# Patient Record
Sex: Female | Born: 2000 | Race: Black or African American | Hispanic: No | Marital: Single | Smoking: Never smoker
Health system: Southern US, Community
[De-identification: ages and names within clinical notes are randomized; demographics above are authoritative.]

## PROBLEM LIST (undated history)

## (undated) ENCOUNTER — Ambulatory Visit: Admission: EM | Disposition: A | Discharge status: Other Acute Inpatient Hospital | Payer: Medicaid Other

## (undated) DIAGNOSIS — E559 Vitamin D deficiency, unspecified: Secondary | ICD-10-CM

## (undated) DIAGNOSIS — K5190 Ulcerative colitis, unspecified, without complications: Secondary | ICD-10-CM

## (undated) HISTORY — DX: Ulcerative colitis, unspecified, without complications: K51.90

## (undated) HISTORY — PX: WISDOM TOOTH EXTRACTION: SHX21

## (undated) HISTORY — PX: TONSILLECTOMY AND ADENOIDECTOMY: SUR1326

## (undated) HISTORY — DX: Vitamin D deficiency, unspecified: E55.9

---
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
## 2019-09-23 DIAGNOSIS — N912 Amenorrhea, unspecified: Secondary | ICD-10-CM | POA: Diagnosis not present

## 2019-09-23 DIAGNOSIS — Z760 Encounter for issue of repeat prescription: Secondary | ICD-10-CM | POA: Diagnosis not present

## 2019-09-23 DIAGNOSIS — Z202 Contact with and (suspected) exposure to infections with a predominantly sexual mode of transmission: Secondary | ICD-10-CM | POA: Diagnosis not present

## 2019-10-04 ENCOUNTER — Other Ambulatory Visit: Payer: Self-pay

## 2019-10-04 ENCOUNTER — Encounter: Payer: Self-pay | Admitting: Emergency Medicine

## 2019-10-04 ENCOUNTER — Ambulatory Visit
Admission: EM | Admit: 2019-10-04 | Discharge: 2019-10-04 | Disposition: A | Discharge status: Home / Self Care | Payer: Medicaid Other

## 2019-10-04 DIAGNOSIS — T162XXA Foreign body in left ear, initial encounter: Secondary | ICD-10-CM | POA: Diagnosis not present

## 2019-10-04 NOTE — ED Notes (Signed)
Patient able to ambulate independently  

## 2019-10-04 NOTE — Discharge Instructions (Signed)
Return for worsening ear pain, swelling, discharge, bleeding, decreased hearing, development of jaw pain/swelling, fever.  Do NOT use Q-tips as these can cause your ear wax to get stuck, the tips may break off and become a foreign body requiring additional medical care, or puncture your eardrum.

## 2019-10-04 NOTE — ED Triage Notes (Addendum)
Pt presents to [facility] for assessment after some of the cotton from her qtip remained in her ear last night.  C/o dampened hearing, denies pain at this time.  This RN does not visualize FB in ear at this time.

## 2019-10-04 NOTE — ED Provider Notes (Signed)
[facility]    CSN: [id number] Arrival date & time: 10/04/19  1000      History   Chief Complaint Chief Complaint  Patient presents with  . Otalgia    HPI Lacey Perez is a 19 y.o. female history of asthma presenting for foreign body in left ear.  States the Q-tip broke off in her left ear when she was cleaning them.  Patient is able to soak area with water this morning without improvement.  Patient endorsing muffled hearing: Denying ear pain, fever, discharge or bleeding.   Past Medical History:  Diagnosis Date  . Asthma     There are no problems to display for this patient.   History reviewed. No pertinent surgical history.  OB History   No obstetric history on file.      Home Medications    Prior to Admission medications   Medication Sig Start Date End Date Taking? Authorizing Provider  valACYclovir (VALTREX) 500 MG tablet Take 500 mg by mouth 2 (two) times daily.   Yes [provider]  albuterol (PROVENTIL HFA;VENTOLIN HFA) 108 (90 BASE) MCG/ACT inhaler Inhale 2 puffs into the lungs every 6 (six) hours as needed. For breathing     [provider]  Fluticasone-Salmeterol (ADVAIR) 250-50 MCG/DOSE AEPB Inhale 1 puff into the lungs every 12 (twelve) hours.      [provider]  montelukast (SINGULAIR) 10 MG tablet Take 10 mg by mouth at bedtime.      [provider]    Family History Family History  Problem Relation Age of Onset  . Hypertension Mother     Social History Social History   Tobacco Use  . Smoking status: Never Smoker  . Smokeless tobacco: Never Used  Substance Use Topics  . Alcohol use: Not Currently  . Drug use: Never     Allergies   Patient has no known allergies.   Review of Systems As per HPI   Physical Exam Triage Vital Signs ED Triage Vitals  Enc Vitals Group     BP      Pulse      Resp      Temp      Temp src      SpO2      Weight      Height      Head  Circumference      Peak Flow      Pain Score      Pain Loc      Pain Edu?      Excl. in [place]?    No data found.  Updated Vital Signs BP 125/90 (BP Location: Left Arm)   Pulse 76   Temp 97.8 F (36.6 C) (Temporal)   Resp 16   LMP 10/01/2019   SpO2 98%   Visual Acuity Right Eye Distance:   Left Eye Distance:   Bilateral Distance:    Right Eye Near:   Left Eye Near:    Bilateral Near:     Physical Exam Constitutional:      General: She is not in acute distress. HENT:     Head: Normocephalic and atraumatic.     Right Ear: Tympanic membrane, ear canal and external ear normal.     Left Ear: External ear normal.     Ears:     Comments: Your canal with polyp this white material. S/p irrigation: TM intact, EAC without swelling, discharge, bleeding.  Foreign body successfully removed Eyes:  General: No scleral icterus.    Pupils: Pupils are equal, round, and reactive to light.  Cardiovascular:     Rate and Rhythm: Normal rate.  Pulmonary:     Effort: Pulmonary effort is normal.  Skin:    Coloration: Skin is not jaundiced or pale.  Neurological:     Mental Status: She is alert and oriented to person, place, and time.      UC Treatments / Results  Labs (all labs ordered are listed, but only abnormal results are displayed) Labs Reviewed - No data to display  EKG   Radiology No results found.  Procedures Procedures (including critical care time)  Medications Ordered in UC Medications - No data to display  Initial Impression / Assessment and Plan / UC Course  I have reviewed the triage vital signs and the nursing notes.  Pertinent labs & imaging results that were available during my care of the patient were reviewed by me and considered in my medical decision making (see chart for details).     Patient afebrile, nontoxic in office today.  Foreign body successfully removed with ear irrigation.  Reassuring exam signs on repeat examination.  No further  intervention needed.  Return precautions discussed, patient verbalized understanding and is agreeable to plan. Final Clinical Impressions(s) / UC Diagnoses   Final diagnoses:  Foreign body of left ear, initial encounter     Discharge Instructions     Return for worsening ear pain, swelling, discharge, bleeding, decreased hearing, development of jaw pain/swelling, fever.  Do NOT use Q-tips as these can cause your ear wax to get stuck, the tips may break off and become a foreign body requiring additional medical care, or puncture your eardrum.    ED Prescriptions    None     PDMP not reviewed this encounter.   Hall[place]Potvin, [place], [place] 10/04/19 1043

## 2019-10-07 ENCOUNTER — Other Ambulatory Visit: Payer: Self-pay

## 2019-10-07 ENCOUNTER — Emergency Department (HOSPITAL_COMMUNITY)
Admission: EM | Admit: 2019-10-07 | Discharge: 2019-10-07 | Disposition: A | Discharge status: Home / Self Care | Attending: Emergency Medicine | Admitting: Emergency Medicine

## 2019-10-07 DIAGNOSIS — Z79899 Other long term (current) drug therapy: Secondary | ICD-10-CM | POA: Diagnosis not present

## 2019-10-07 DIAGNOSIS — S134XXA Sprain of ligaments of cervical spine, initial encounter: Secondary | ICD-10-CM | POA: Diagnosis not present

## 2019-10-07 DIAGNOSIS — R519 Headache, unspecified: Secondary | ICD-10-CM | POA: Insufficient documentation

## 2019-10-07 DIAGNOSIS — Y9389 Activity, other specified: Secondary | ICD-10-CM | POA: Diagnosis not present

## 2019-10-07 DIAGNOSIS — Y92481 Parking lot as the place of occurrence of the external cause: Secondary | ICD-10-CM | POA: Insufficient documentation

## 2019-10-07 DIAGNOSIS — J45909 Unspecified asthma, uncomplicated: Secondary | ICD-10-CM | POA: Insufficient documentation

## 2019-10-07 DIAGNOSIS — S199XXA Unspecified injury of neck, initial encounter: Secondary | ICD-10-CM | POA: Diagnosis present

## 2019-10-07 DIAGNOSIS — Y999 Unspecified external cause status: Secondary | ICD-10-CM | POA: Insufficient documentation

## 2019-10-07 DIAGNOSIS — S161XXA Strain of muscle, fascia and tendon at neck level, initial encounter: Secondary | ICD-10-CM | POA: Insufficient documentation

## 2019-10-07 DIAGNOSIS — Z20828 Contact with and (suspected) exposure to other viral communicable diseases: Secondary | ICD-10-CM | POA: Diagnosis not present

## 2019-10-07 MED ORDER — NAPROXEN 500 MG PO TABS: 500.0000 mg | ORAL_TABLET | Freq: Two times a day (BID) | ORAL | 0 refills | Status: DC

## 2019-10-07 MED ORDER — CYCLOBENZAPRINE HCL 10 MG PO TABS: 10.0000 mg | ORAL_TABLET | Freq: Two times a day (BID) | ORAL | 0 refills | Status: DC | PRN

## 2019-10-07 NOTE — Discharge Instructions (Signed)
Thank you for allowing me to care for you today. Please return to the emergency department if you have new or worsening symptoms. Take your medications as instructed.  ° °
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
## 2019-10-07 NOTE — ED Notes (Signed)
ED Provider at bedside. 

## 2019-10-11 ENCOUNTER — Ambulatory Visit (INDEPENDENT_AMBULATORY_CARE_PROVIDER_SITE_OTHER)
Admission: RE | Admit: 2019-10-11 | Discharge: 2019-10-11 | Disposition: A | Discharge status: Home / Self Care | Source: Ambulatory Visit

## 2019-10-11 DIAGNOSIS — B9689 Other specified bacterial agents as the cause of diseases classified elsewhere: Secondary | ICD-10-CM | POA: Diagnosis not present

## 2019-10-11 DIAGNOSIS — J0110 Acute frontal sinusitis, unspecified: Secondary | ICD-10-CM | POA: Diagnosis not present

## 2019-10-11 MED ORDER — FLUTICASONE PROPIONATE 50 MCG/ACT NA SUSP: 1.0000 | Freq: Every day | NASAL | 2 refills | Status: DC

## 2019-10-11 MED ORDER — AMOXICILLIN-POT CLAVULANATE 875-125 MG PO TABS: 1.0000 | ORAL_TABLET | Freq: Two times a day (BID) | ORAL | 0 refills | Status: DC

## 2019-10-11 MED ORDER — CETIRIZINE-PSEUDOEPHEDRINE ER 5-120 MG PO TB12: 1.0000 | ORAL_TABLET | Freq: Every day | ORAL | 0 refills | Status: DC

## 2019-10-11 NOTE — Discharge Instructions (Signed)
Treating you for a sinus infection.  Take medication as prescribed.

## 2019-10-11 NOTE — ED Provider Notes (Signed)
Virtual Visit via Video Note:  Deniesha Stenglein  initiated request for Telemedicine visit with [facility] Urgent [facility] team. I connected with Denice Bors  on 10/11/2019 at 4:27 PM  for a synchronized telemedicine visit using a video enabled HIPPA compliant telemedicine application. I verified that I am speaking with Denice Bors  using two identifiers. [name], NP  was physically located in a [facility] Urgent care site and Shanaia Sievers was located at a different location.   The limitations of evaluation and management by telemedicine as well as the availability of in-person appointments were discussed. Patient was informed that she  may incur a bill ( including co-pay) for this virtual visit encounter. [name]  expressed understanding and gave verbal consent to proceed with virtual visit.     History of Present Illness:[name]  is a 19 y.o. female presents with nasal congestion, rhinorrhea and cough x 1 week or more. Thick mucous. Covid negative last Wednesday. Productive cough. Taking OTC cold medications with some relief. Sinus pressure, headaches. No fatigue. No fever. She has also been taking zyrtec. Feels like her symptoms are not improving at all.   Past Medical History:  Diagnosis Date  . Asthma     No Known Allergies      Observations/Objective: VITALS: Per patient if applicable, see vitals. GENERAL: Alert, appears well and in no acute distress. HEENT: Atraumatic, conjunctiva clear, no obvious abnormalities on inspection of external nose and ears. NECK: Normal movements of the head and neck. CARDIOPULMONARY: No increased WOB. Speaking in clear sentences. I:E ratio WNL.  MS: Moves all visible extremities without noticeable abnormality. PSYCH: Pleasant and cooperative, well-groomed. Speech normal rate and rhythm. Affect is appropriate. Insight and judgement are appropriate. Attention is focused, linear, and appropriate.  NEURO: CN grossly intact.  Oriented as arrived to appointment on time with no prompting. Moves both UE equally.  SKIN: No obvious lesions, wounds, erythema, or cyanosis noted on face or hands.     Assessment and Plan: Sinusitis-treating for bacterial sinus infection based on length of symptoms Treated with Augmentin. Flonase and Zyrtec   Follow Up Instructions:Follow up as needed for continued or worsening symptoms     I discussed the assessment and treatment plan with the patient. The patient was provided an opportunity to ask questions and all were answered. The patient agreed with the plan and demonstrated an understanding of the instructions.   The patient was advised to call back or seek an in-person evaluation if the symptoms worsen or if the condition fails to improve as anticipated.    [name], NP  10/11/2019 4:27 PM         [name], NP 10/11/19 1627

## 2019-12-25 ENCOUNTER — Ambulatory Visit
Admission: EM | Admit: 2019-12-25 | Discharge: 2019-12-25 | Disposition: A | Discharge status: Home / Self Care | Payer: Medicaid Other

## 2019-12-25 DIAGNOSIS — B373 Candidiasis of vulva and vagina: Secondary | ICD-10-CM

## 2019-12-25 DIAGNOSIS — R030 Elevated blood-pressure reading, without diagnosis of hypertension: Secondary | ICD-10-CM | POA: Diagnosis not present

## 2019-12-25 DIAGNOSIS — B3731 Acute candidiasis of vulva and vagina: Secondary | ICD-10-CM

## 2019-12-25 MED ORDER — FLUCONAZOLE 150 MG PO TABS: 150.0000 mg | ORAL_TABLET | Freq: Every day | ORAL | 0 refills | Status: DC

## 2019-12-25 NOTE — Discharge Instructions (Addendum)
Take Diflucan once a day, may repeat dose in 3 days if needed. Important to follow-up with your PCP regarding blood pressure readings.

## 2019-12-25 NOTE — ED Triage Notes (Signed)
Pt c/o white clumpy vaginal discharge x1wk. Denies odor or having un protective intercourse.

## 2019-12-25 NOTE — ED Provider Notes (Signed)
[facility]    CSN: [id number] Arrival date & time: 12/25/19  1055      History   Chief Complaint Chief Complaint  Patient presents with  . Vaginal Discharge    HPI Lacey Perez is a 19 y.o. female with history of asthma presenting for yeast infection.  Patient states she has had this before: Feels the same.  Endorsing vaginal pruritus, thick, white, clumpy vaginal discharge x1 week.  Denies irregular bleeding, vaginal pelvic pain, abdominal or back pain, fever.  Patient is not currently sexually active: No concern for STI or pregnancy.  Has tried Monistat without relief.   Past Medical History:  Diagnosis Date  . Asthma     There are no problems to display for this patient.   History reviewed. No pertinent surgical history.  OB History   No obstetric history on file.      Home Medications    Prior to Admission medications   Medication Sig Start Date End Date Taking? Authorizing Provider  valACYclovir (VALTREX) 500 MG tablet Take 500 mg by mouth daily.   Yes [provider]  fluconazole (DIFLUCAN) 150 MG tablet Take 1 tablet (150 mg total) by mouth daily. May repeat in 72 hours if needed 12/25/19   Hall-Potvin, [place], PA-C  albuterol (PROVENTIL HFA;VENTOLIN HFA) 108 (90 BASE) MCG/ACT inhaler Inhale 2 puffs into the lungs every 6 (six) hours as needed. For breathing   10/11/19  [provider]  Fluticasone-Salmeterol (ADVAIR) 250-50 MCG/DOSE AEPB Inhale 1 puff into the lungs every 12 (twelve) hours.    10/11/19  [provider]  montelukast (SINGULAIR) 10 MG tablet Take 10 mg by mouth at bedtime.    10/11/19  [provider]    Family History Family History  Problem Relation Age of Onset  . Hypertension Mother     Social History Social History   Tobacco Use  . Smoking status: Never Smoker  . Smokeless tobacco: Never Used  Substance Use Topics  . Alcohol use: Not Currently  . Drug use: Never      Allergies   Patient has no known allergies.   Review of Systems As per HPI   Physical Exam Triage Vital Signs ED Triage Vitals  Enc Vitals Group     BP      Pulse      Resp      Temp      Temp src      SpO2      Weight      Height      Head Circumference      Peak Flow      Pain Score      Pain Loc      Pain Edu?      Excl. in GC?    No data found.  Updated Vital Signs BP (!) 140/94 (BP Location: Left Arm)   Pulse 91   Temp 98.1 F (36.7 C) (Oral)   Resp 18   LMP 11/29/2019   SpO2 98%   Visual Acuity Right Eye Distance:   Left Eye Distance:   Bilateral Distance:    Right Eye Near:   Left Eye Near:    Bilateral Near:     Physical Exam Constitutional:      General: She is not in acute distress. HENT:     Head: Normocephalic and atraumatic.  Eyes:     General: No scleral icterus.    Pupils: Pupils are equal, round, and reactive to  light.  Cardiovascular:     Rate and Rhythm: Normal rate.  Pulmonary:     Effort: Pulmonary effort is normal.  Abdominal:     General: Bowel sounds are normal.     Palpations: Abdomen is soft.     Tenderness: There is no abdominal tenderness. There is no right CVA tenderness, left CVA tenderness or guarding.  Genitourinary:    Comments: Patient declined Skin:    Coloration: Skin is not jaundiced or pale.  Neurological:     Mental Status: She is alert and oriented to person, place, and time.      UC Treatments / Results  Labs (all labs ordered are listed, but only abnormal results are displayed) Labs Reviewed - No data to display  EKG   Radiology No results found.  Procedures Procedures (including critical care time)  Medications Ordered in UC Medications - No data to display  Initial Impression / Assessment and Plan / UC Course  I have reviewed the triage vital signs and the nursing notes.  Pertinent labs & imaging results that were available during my care of the patient were reviewed by me  and considered in my medical decision making (see chart for details).     H&P consistent with yeast vaginitis: Diflucan sent.  Patient does have elevated blood pressure reading in office without diagnosis of hypertension.  Denying signs/symptoms of endorgan damage: We will follow-up with PCP for further evaluation/management.  Return precautions discussed, patient verbalized understanding and is agreeable to plan. Final Clinical Impressions(s) / UC Diagnoses   Final diagnoses:  Elevated blood pressure reading without diagnosis of hypertension  Vaginal yeast infection     Discharge Instructions     Take Diflucan once a day, may repeat dose in 3 days if needed. Important to follow-up with your PCP regarding blood pressure readings.    ED Prescriptions    Medication Sig Dispense Auth. Provider   fluconazole (DIFLUCAN) 150 MG tablet Take 1 tablet (150 mg total) by mouth daily. May repeat in 72 hours if needed 2 tablet Hall-Potvin, [place], PA-C     PDMP not reviewed this encounter.   Hall-Potvin, [place], [place] 12/25/19 1113

## 2020-05-14 DIAGNOSIS — Z20822 Contact with and (suspected) exposure to covid-19: Secondary | ICD-10-CM | POA: Diagnosis not present

## 2020-05-28 ENCOUNTER — Other Ambulatory Visit: Payer: Self-pay

## 2020-05-28 ENCOUNTER — Ambulatory Visit
Admission: EM | Admit: 2020-05-28 | Discharge: 2020-05-28 | Disposition: A | Discharge status: Home / Self Care | Payer: Medicaid Other | Attending: Family Medicine | Admitting: Family Medicine

## 2020-05-28 DIAGNOSIS — R519 Headache, unspecified: Secondary | ICD-10-CM

## 2020-05-28 DIAGNOSIS — J069 Acute upper respiratory infection, unspecified: Secondary | ICD-10-CM

## 2020-05-28 DIAGNOSIS — R52 Pain, unspecified: Secondary | ICD-10-CM

## 2020-05-28 MED ORDER — IPRATROPIUM BROMIDE 0.03 % NA SOLN: 2.0000 | Freq: Two times a day (BID) | NASAL | 0 refills | Status: DC | PRN

## 2020-05-28 MED ORDER — TIZANIDINE HCL 2 MG PO TABS: 4.0000 mg | ORAL_TABLET | Freq: Four times a day (QID) | ORAL | 0 refills | Status: DC | PRN

## 2020-05-28 MED ORDER — NAPROXEN 375 MG PO TABS: 375.0000 mg | ORAL_TABLET | Freq: Two times a day (BID) | ORAL | 0 refills | Status: DC

## 2020-05-28 NOTE — ED Triage Notes (Signed)
Pt states received her first covid vaccine on Monday and developed headaches and body aches right afterwards. Pt states has a slight cough and runny nose now.

## 2020-05-28 NOTE — ED Provider Notes (Signed)
[facility]    CSN: [id number] Arrival date & time: 05/28/20  0809      History   Chief Complaint Chief Complaint  Patient presents with  . Headache    HPI Lacey Perez is a 19 y.o. female.   HPI   Patient presents today with a concern of headache and body aches along with congestion and a mild cough since having her first Covid vaccine on 05/24/2020. She reports symptoms started the night following her vaccine.  She has not had a fever or shortness of breath.  She is afebrile on arrival.  She denies any known sick contacts.  She is unvaccinated against influenza.  Denies any shortness of breath although endorses a persistent headache which is generalized.  She has taken ibuprofen without relief of symptoms.  Past Medical History:  Diagnosis Date  . Asthma     There are no problems to display for this patient.   History reviewed. No pertinent surgical history.  OB History   No obstetric history on file.      Home Medications    Prior to Admission medications   Medication Sig Start Date End Date Taking? Authorizing Provider  ipratropium (ATROVENT) 0.03 % nasal spray Place 2 sprays into both nostrils 2 (two) times daily as needed for rhinitis. 05/28/20   Bing Neighbors, FNP  naproxen (NAPROSYN) 375 MG tablet Take 1 tablet (375 mg total) by mouth 2 (two) times daily. 05/28/20   Bing Neighbors, FNP  tiZANidine (ZANAFLEX) 2 MG tablet Take 2 tablets (4 mg total) by mouth every 6 (six) hours as needed for muscle spasms. 05/28/20   Bing Neighbors, FNP  albuterol (PROVENTIL HFA;VENTOLIN HFA) 108 (90 BASE) MCG/ACT inhaler Inhale 2 puffs into the lungs every 6 (six) hours as needed. For breathing   10/11/19  [provider]  Fluticasone-Salmeterol (ADVAIR) 250-50 MCG/DOSE AEPB Inhale 1 puff into the lungs every 12 (twelve) hours.    10/11/19  [provider]  montelukast (SINGULAIR) 10 MG tablet Take 10 mg by mouth at bedtime.    10/11/19   [provider]    Family History Family History  Problem Relation Age of Onset  . Hypertension Mother     Social History Social History   Tobacco Use  . Smoking status: Never Smoker  . Smokeless tobacco: Never Used  Substance Use Topics  . Alcohol use: Not Currently  . Drug use: Never     Allergies   Patient has no known allergies.   Review of Systems Review of Systems Pertinent negatives listed in HPI  Physical Exam Triage Vital Signs ED Triage Vitals [05/28/20 0821]  Enc Vitals Group     BP 117/84     Pulse Rate 73     Resp 18     Temp 98.3 F (36.8 C)     Temp Source Oral     SpO2 97 %     Weight      Height      Head Circumference      Peak Flow      Pain Score 7     Pain Loc      Pain Edu?      Excl. in GC?    No data found.  Updated Vital Signs BP 117/84 (BP Location: Left Arm)   Pulse 73   Temp 98.3 F (36.8 C) (Oral)   Resp 18   LMP 04/29/2020   SpO2 97%   Visual Acuity  Right Eye Distance:   Left Eye Distance:   Bilateral Distance:    Right Eye Near:   Left Eye Near:    Bilateral Near:     Physical Exam Constitutional:      General: She is not in acute distress.    Appearance: She is ill-appearing. She is not toxic-appearing.  HENT:     Head: Normocephalic.     Jaw: There is normal jaw occlusion.     Nose: Mucosal edema, congestion and rhinorrhea present.     Mouth/Throat:     Mouth: Mucous membranes are moist.     Pharynx: Oropharynx is clear. Uvula midline. No pharyngeal swelling, posterior oropharyngeal erythema or uvula swelling.     Tonsils: No tonsillar exudate.  Cardiovascular:     Rate and Rhythm: Normal rate and regular rhythm.  Pulmonary:     Effort: Pulmonary effort is normal.     Breath sounds: Normal breath sounds.  Skin:    General: Skin is warm and dry.  Psychiatric:        Mood and Affect: Mood normal.        Speech: Speech normal.        Behavior: Behavior normal.      UC Treatments /  Results  Labs (all labs ordered are listed, but only abnormal results are displayed) Labs Reviewed  COVID-19, FLU A+B AND RSV    EKG   Radiology No results found.  Procedures Procedures (including critical care time)  Medications Ordered in UC Medications - No data to display  Initial Impression / Assessment and Plan / UC Course  I have reviewed the triage vital signs and the nursing notes.  Pertinent labs & imaging results that were available during my care of the patient were reviewed by me and considered in my medical decision making (see chart for details).    Patient presents with URI symptoms and a headache along with persistent generalized body aches following COVID-19 vaccine patient is afebrile.  Will treat symptomatically given no evidence of an infection.  For headache recommended naproxen twice daily as needed for headache.  Prescribed tizanidine and advised not to take if driving or working as medication can cause severe drowsiness.  Respiratory panel pending advised results will be uploaded to MyChart.  If symptoms worsen or you experience difficulty breathing, advised to go immediately to the ER for further evaluation. Final Clinical Impressions(s) / UC Diagnoses   Final diagnoses:  Upper respiratory tract infection, unspecified type  Body aches after vaccination  Nonintractable headache, unspecified chronicity pattern, unspecified headache type   Discharge Instructions   None    ED Prescriptions    Medication Sig Dispense Auth. Provider   naproxen (NAPROSYN) 375 MG tablet Take 1 tablet (375 mg total) by mouth 2 (two) times daily. 20 tablet Bing Neighbors, FNP   tiZANidine (ZANAFLEX) 2 MG tablet Take 2 tablets (4 mg total) by mouth every 6 (six) hours as needed for muscle spasms. 20 tablet Bing Neighbors, FNP   ipratropium (ATROVENT) 0.03 % nasal spray Place 2 sprays into both nostrils 2 (two) times daily as needed for rhinitis. 30 mL Bing Neighbors, FNP     PDMP not reviewed this encounter.   Bing Neighbors, [place] 05/28/20 [phone]

## 2020-05-29 LAB — COVID-19, FLU A+B AND RSV
Influenza A, NAA: NOT DETECTED
Influenza B, NAA: NOT DETECTED
RSV, NAA: NOT DETECTED
SARS-CoV-2, NAA: NOT DETECTED

## 2020-06-03 DIAGNOSIS — Z20822 Contact with and (suspected) exposure to covid-19: Secondary | ICD-10-CM | POA: Diagnosis not present

## 2020-06-10 DIAGNOSIS — Z20822 Contact with and (suspected) exposure to covid-19: Secondary | ICD-10-CM | POA: Diagnosis not present

## 2020-06-13 ENCOUNTER — Ambulatory Visit
Admission: EM | Admit: 2020-06-13 | Discharge: 2020-06-13 | Disposition: A | Discharge status: Home / Self Care | Payer: Medicaid Other | Attending: Physician Assistant | Admitting: Physician Assistant

## 2020-06-13 ENCOUNTER — Other Ambulatory Visit: Payer: Self-pay

## 2020-06-13 DIAGNOSIS — K5900 Constipation, unspecified: Secondary | ICD-10-CM

## 2020-06-13 MED ORDER — POLYETHYLENE GLYCOL 3350 17 G PO PACK: 17.0000 g | PACK | Freq: Every day | ORAL | 0 refills | Status: DC

## 2020-06-13 MED ORDER — MINERAL OIL RE ENEM: 1.0000 | ENEMA | Freq: Once | RECTAL | 0 refills | Status: AC

## 2020-06-13 NOTE — ED Triage Notes (Signed)
Pt states she feels like she has to have a bowel movement and does not and when she does it is primarily liquid. Pt also states she feels fairly constant pressure like shew has to go but is unable. Pt states she has had this occur intermittently. Pt is aox4 and ambulatory.

## 2020-06-13 NOTE — Discharge Instructions (Signed)
Start mineral oil enema as directed. Miralax as directed. Keep hydrated, urine should be clear to pale yellow in color. Sitz bath as directed. Follow up with PCP if symptoms not improving. If having significant abdominal pain, vomiting, fever, go to the emergency department for further evaluation.

## 2020-06-13 NOTE — ED Provider Notes (Signed)
[facility]    CSN: [id number] Arrival date & time: 06/13/20  1224      History   Chief Complaint Chief Complaint  Patient presents with  . Abdominal Pain    since friday    HPI Alexiss Iturralde is a 19 y.o. female.   19 year old female comes in for changes in bowel movement/habits. States normally has trouble with constipation. States now with urge to move bowels, straining, but only with some stool leakage. Aching pain to bilateral lower abdomen that is intermittent. Nausea without vomiting. Denies fever. Denies urinary changes. LMP 05/30/2020  Also noted to have pressure to the rectum with worries of hemorrhoid. No pain, blood.      Past Medical History:  Diagnosis Date  . Asthma     There are no problems to display for this patient.   History reviewed. No pertinent surgical history.  OB History   No obstetric history on file.      Home Medications    Prior to Admission medications   Medication Sig Start Date End Date Taking? Authorizing Provider  naproxen (NAPROSYN) 375 MG tablet Take 1 tablet (375 mg total) by mouth 2 (two) times daily. 05/28/20  Yes Bing Neighbors, FNP  tiZANidine (ZANAFLEX) 2 MG tablet Take 2 tablets (4 mg total) by mouth every 6 (six) hours as needed for muscle spasms. 05/28/20  Yes Bing Neighbors, FNP  mineral oil enema Place 133 mLs (1 enema total) rectally once for 1 dose. 06/13/20 06/13/20  Cathie Hoops, Neeraj Housand V, PA-C  polyethylene glycol (MIRALAX) 17 g packet Take 17 g by mouth daily. 06/13/20   Cathie Hoops, Brandie Lopes V, PA-C  albuterol (PROVENTIL HFA;VENTOLIN HFA) 108 (90 BASE) MCG/ACT inhaler Inhale 2 puffs into the lungs every 6 (six) hours as needed. For breathing   10/11/19  [provider]  Fluticasone-Salmeterol (ADVAIR) 250-50 MCG/DOSE AEPB Inhale 1 puff into the lungs every 12 (twelve) hours.    10/11/19  [provider]  ipratropium (ATROVENT) 0.03 % nasal spray Place 2 sprays into both nostrils 2 (two) times daily  as needed for rhinitis. 05/28/20 06/13/20  Bing Neighbors, FNP  montelukast (SINGULAIR) 10 MG tablet Take 10 mg by mouth at bedtime.    10/11/19  [provider]    Family History Family History  Problem Relation Age of Onset  . Hypertension Mother     Social History Social History   Tobacco Use  . Smoking status: Never Smoker  . Smokeless tobacco: Never Used  Vaping Use  . Vaping Use: Never used  Substance Use Topics  . Alcohol use: Not Currently  . Drug use: Never     Allergies   Patient has no known allergies.   Review of Systems Review of Systems  Reason unable to perform ROS: See HPI as above.     Physical Exam Triage Vital Signs ED Triage Vitals  Enc Vitals Group     BP 06/13/20 1421 122/81     Pulse Rate 06/13/20 1421 100     Resp 06/13/20 1421 20     Temp 06/13/20 1421 98.5 F (36.9 C)     Temp Source 06/13/20 1421 Oral     SpO2 06/13/20 1421 96 %     Weight --      Height --      Head Circumference --      Peak Flow --      Pain Score 06/13/20 1431 0     Pain  Loc --      Pain Edu? --      Excl. in GC? --    No data found.  Updated Vital Signs BP 122/81 (BP Location: Left Arm)   Pulse 100   Temp 98.5 F (36.9 C) (Oral)   Resp 20   LMP 05/30/2020 (Exact Date)   SpO2 96%   Physical Exam Exam conducted with a chaperone present.  Constitutional:      General: She is not in acute distress.    Appearance: She is well-developed. She is not ill-appearing, toxic-appearing or diaphoretic.  HENT:     Head: Normocephalic and atraumatic.  Eyes:     Conjunctiva/sclera: Conjunctivae normal.     Pupils: Pupils are equal, round, and reactive to light.  Cardiovascular:     Rate and Rhythm: Normal rate and regular rhythm.  Pulmonary:     Effort: Pulmonary effort is normal. No respiratory distress.     Comments: LCTAB Abdominal:     General: Bowel sounds are normal.     Palpations: Abdomen is soft.     Tenderness: There is no abdominal  tenderness. There is no right CVA tenderness, left CVA tenderness, guarding or rebound.  Genitourinary:    Comments: No external hemorrhoid noted. No tenderness to palpation.  Musculoskeletal:     Cervical back: Normal range of motion and neck supple.  Skin:    General: Skin is warm and dry.  Neurological:     Mental Status: She is alert and oriented to person, place, and time.  Psychiatric:        Behavior: Behavior normal.        Judgment: Judgment normal.      UC Treatments / Results  Labs (all labs ordered are listed, but only abnormal results are displayed) Labs Reviewed  POCT URINE PREGNANCY  POCT URINALYSIS DIP (MANUAL ENTRY)    EKG   Radiology No results found.  Procedures Procedures (including critical care time)  Medications Ordered in UC Medications - No data to display  Initial Impression / Assessment and Plan / UC Course  I have reviewed the triage vital signs and the nursing notes.  Pertinent labs & imaging results that were available during my care of the patient were reviewed by me and considered in my medical decision making (see chart for details).    ?fecal impaction. Mineral oil, miralax as directed. Push fluids. Return precautions given.  Final Clinical Impressions(s) / UC Diagnoses   Final diagnoses:  Constipation, unspecified constipation type    ED Prescriptions    Medication Sig Dispense Auth. Provider   mineral oil enema Place 133 mLs (1 enema total) rectally once for 1 dose. 133 mL Ehab Humber V, PA-C   polyethylene glycol (MIRALAX) 17 g packet Take 17 g by mouth daily. 14 each Belinda Fisher, PA-C     PDMP not reviewed this encounter.   Belinda Fisher, PA-C 06/13/20 1855

## 2020-06-16 DIAGNOSIS — R1030 Lower abdominal pain, unspecified: Secondary | ICD-10-CM | POA: Diagnosis not present

## 2020-06-16 DIAGNOSIS — R197 Diarrhea, unspecified: Secondary | ICD-10-CM | POA: Diagnosis not present

## 2020-06-16 DIAGNOSIS — R1084 Generalized abdominal pain: Secondary | ICD-10-CM | POA: Diagnosis not present

## 2020-06-16 DIAGNOSIS — R1110 Vomiting, unspecified: Secondary | ICD-10-CM | POA: Diagnosis not present

## 2020-06-16 DIAGNOSIS — K921 Melena: Secondary | ICD-10-CM | POA: Diagnosis not present

## 2020-08-03 ENCOUNTER — Other Ambulatory Visit: Payer: Self-pay

## 2020-08-03 ENCOUNTER — Ambulatory Visit
Admission: EM | Admit: 2020-08-03 | Discharge: 2020-08-03 | Disposition: A | Discharge status: Home / Self Care | Payer: Medicaid Other | Attending: Internal Medicine | Admitting: Internal Medicine

## 2020-08-03 ENCOUNTER — Encounter: Payer: Self-pay | Admitting: Emergency Medicine

## 2020-08-03 DIAGNOSIS — Z20822 Contact with and (suspected) exposure to covid-19: Secondary | ICD-10-CM | POA: Diagnosis not present

## 2020-08-03 DIAGNOSIS — J029 Acute pharyngitis, unspecified: Secondary | ICD-10-CM

## 2020-08-03 NOTE — Discharge Instructions (Signed)
Please quarantine until covid-19 test is available Increase fluid intake

## 2020-08-03 NOTE — ED Triage Notes (Signed)
Pt c/o URI sx with cough and sore throat and HA x 3 days; pt sts possible covid exposure

## 2020-08-03 NOTE — ED Provider Notes (Signed)
[facility]    CSN: [id number] Arrival date & time: 08/03/20  4709      History   Chief Complaint Chief Complaint  Patient presents with  . URI    HPI Charyl Minervini is a 20 y.o. female comes to urgent care with a 3-day history of nonproductive cough, runny nose, sore throat and headache.  Patient was exposed to COVID-19 positive individual at work.  She denies any nausea or vomiting.  No shortness of breath.  No generalized body aches.Marland Kitchen   HPI  Past Medical History:  Diagnosis Date  . Asthma     There are no problems to display for this patient.   History reviewed. No pertinent surgical history.  OB History   No obstetric history on file.      Home Medications    Prior to Admission medications   Medication Sig Start Date End Date Taking? Authorizing Provider  naproxen (NAPROSYN) 375 MG tablet Take 1 tablet (375 mg total) by mouth 2 (two) times daily. 05/28/20   Bing Neighbors, FNP  polyethylene glycol (MIRALAX) 17 g packet Take 17 g by mouth daily. 06/13/20   Cathie Hoops, Amy V, PA-C  albuterol (PROVENTIL HFA;VENTOLIN HFA) 108 (90 BASE) MCG/ACT inhaler Inhale 2 puffs into the lungs every 6 (six) hours as needed. For breathing   10/11/19  [provider]  Fluticasone-Salmeterol (ADVAIR) 250-50 MCG/DOSE AEPB Inhale 1 puff into the lungs every 12 (twelve) hours.    10/11/19  [provider]  ipratropium (ATROVENT) 0.03 % nasal spray Place 2 sprays into both nostrils 2 (two) times daily as needed for rhinitis. 05/28/20 06/13/20  Bing Neighbors, FNP  montelukast (SINGULAIR) 10 MG tablet Take 10 mg by mouth at bedtime.    10/11/19  [provider]    Family History Family History  Problem Relation Age of Onset  . Hypertension Mother     Social History Social History   Tobacco Use  . Smoking status: Never Smoker  . Smokeless tobacco: Never Used  Vaping Use  . Vaping Use: Never used  Substance Use Topics  . Alcohol use: Not  Currently  . Drug use: Never     Allergies   Patient has no known allergies.   Review of Systems Review of Systems Per HPI Physical Exam Triage Vital Signs ED Triage Vitals [08/03/20 0958]  Enc Vitals Group     BP 119/72     Pulse Rate 79     Resp 18     Temp 98.3 F (36.8 C)     Temp Source Oral     SpO2 99 %     Weight      Height      Head Circumference      Peak Flow      Pain Score 2     Pain Loc      Pain Edu?      Excl. in GC?    No data found.  Updated Vital Signs BP 119/72 (BP Location: Left Arm)   Pulse 79   Temp 98.3 F (36.8 C) (Oral)   Resp 18   SpO2 99%   Visual Acuity Right Eye Distance:   Left Eye Distance:   Bilateral Distance:    Right Eye Near:   Left Eye Near:    Bilateral Near:     Physical Exam Vitals and nursing note reviewed.  Constitutional:      Appearance: She is not ill-appearing.  HENT:  Mouth/Throat:     Pharynx: No oropharyngeal exudate or posterior oropharyngeal erythema.  Cardiovascular:     Rate and Rhythm: Normal rate and regular rhythm.     Pulses: Normal pulses.     Heart sounds: Normal heart sounds.  Pulmonary:     Effort: Pulmonary effort is normal.     Breath sounds: Normal breath sounds.  Abdominal:     General: Bowel sounds are normal.     Palpations: Abdomen is soft.  Neurological:     Mental Status: She is alert.      UC Treatments / Results  Labs (all labs ordered are listed, but only abnormal results are displayed) Labs Reviewed  NOVEL CORONAVIRUS, NAA    EKG   Radiology No results found.  Procedures Procedures (including critical care time)  Medications Ordered in UC Medications - No data to display  Initial Impression / Assessment and Plan / UC Course  I have reviewed the triage vital signs and the nursing notes.  Pertinent labs & imaging results that were available during my care of the patient were reviewed by me and considered in my medical decision making (see chart  for details).     1.  Upper respiratory infection symptoms in the setting of COVID-19 exposure: COVID-19 PCR test has been sent Patient is advised to quarantine until COVID-19 test results are available Increase oral fluid intake If symptoms worsen please return to urgent care to be reevaluated. Final Clinical Impressions(s) / UC Diagnoses   Final diagnoses:  Sorethroat  Close exposure to COVID-19 virus     Discharge Instructions     Please quarantine until covid-19 test is available Increase fluid intake   ED Prescriptions    None     PDMP not reviewed this encounter.   Merrilee Jansky, MD 08/03/20 1034

## 2020-08-04 LAB — SARS-COV-2, NAA 2 DAY TAT

## 2020-08-04 LAB — NOVEL CORONAVIRUS, NAA: SARS-CoV-2, NAA: NOT DETECTED

## 2020-08-05 ENCOUNTER — Other Ambulatory Visit: Payer: Self-pay

## 2020-08-05 ENCOUNTER — Ambulatory Visit
Admission: EM | Admit: 2020-08-05 | Discharge: 2020-08-05 | Disposition: A | Discharge status: Home / Self Care | Payer: Medicaid Other | Attending: Emergency Medicine | Admitting: Emergency Medicine

## 2020-08-05 DIAGNOSIS — J069 Acute upper respiratory infection, unspecified: Secondary | ICD-10-CM | POA: Diagnosis not present

## 2020-08-05 MED ORDER — PSEUDOEPH-BROMPHEN-DM 30-2-10 MG/5ML PO SYRP: 5.0000 mL | ORAL_SOLUTION | Freq: Four times a day (QID) | ORAL | 0 refills | Status: DC | PRN

## 2020-08-05 MED ORDER — CETIRIZINE HCL 10 MG PO CAPS: 10.0000 mg | ORAL_CAPSULE | Freq: Every day | ORAL | 0 refills | Status: DC

## 2020-08-05 MED ORDER — FLUTICASONE PROPIONATE 50 MCG/ACT NA SUSP: 1.0000 | Freq: Every day | NASAL | 0 refills | Status: DC

## 2020-08-05 MED ORDER — AMOXICILLIN-POT CLAVULANATE 875-125 MG PO TABS: 1.0000 | ORAL_TABLET | Freq: Two times a day (BID) | ORAL | 0 refills | Status: AC

## 2020-08-05 NOTE — Discharge Instructions (Signed)
Begin daily cetirizine and Flonase for further relief of congestion and drainage/throat irritation May use cough syrup every 6-8 hours as needed for cough Rest and fluids May fill prescription for Augmentin on Monday if not improving or worsening despite an additional 3 to 4 days and use of the above

## 2020-08-05 NOTE — ED Provider Notes (Signed)
[facility]    CSN: [id number] Arrival date & time: 08/05/20  1532      History   Chief Complaint Chief Complaint  Patient presents with  . Sore Throat    X 10 days  . Nasal Congestion    X 10 days  . Diarrhea    Started today  . Cough    X 10 days    HPI Lacey Perez is a 20 y.o. female presenting today for evaluation of URI symptoms.  Reports sore throat cough congestion and drainage over the past week.  Was seen here 2 days ago and had COVID test which was negative.  Symptoms did improve, but worsened.  Reports symptoms began on Saturday, 5 to 6 days ago.  Reports a lot of green mucus.  HPI  Past Medical History:  Diagnosis Date  . Asthma     There are no problems to display for this patient.   History reviewed. No pertinent surgical history.  OB History   No obstetric history on file.      Home Medications    Prior to Admission medications   Medication Sig Start Date End Date Taking? Authorizing Provider  amoxicillin-clavulanate (AUGMENTIN) 875-125 MG tablet Take 1 tablet by mouth every 12 (twelve) hours for 7 days. 08/09/20 08/16/20 Yes Braydn Carneiro C, PA-C  brompheniramine-pseudoephedrine-DM 30-2-10 MG/5ML syrup Take 5 mLs by mouth 4 (four) times daily as needed. 08/05/20  Yes Lakindra Wible C, PA-C  Cetirizine HCl 10 MG CAPS Take 1 capsule (10 mg total) by mouth daily for 10 days. 08/05/20 08/15/20 Yes Arjan Strohm C, PA-C  fluticasone (FLONASE) 50 MCG/ACT nasal spray Place 1-2 sprays into both nostrils daily. 08/05/20  Yes Avalynne Diver C, PA-C  naproxen (NAPROSYN) 375 MG tablet Take 1 tablet (375 mg total) by mouth 2 (two) times daily. 05/28/20  Yes Bing Neighbors, FNP  albuterol (PROVENTIL HFA;VENTOLIN HFA) 108 (90 BASE) MCG/ACT inhaler Inhale 2 puffs into the lungs every 6 (six) hours as needed. For breathing   10/11/19  [provider]  Fluticasone-Salmeterol (ADVAIR) 250-50 MCG/DOSE AEPB Inhale 1 puff into the lungs  every 12 (twelve) hours.    10/11/19  [provider]  ipratropium (ATROVENT) 0.03 % nasal spray Place 2 sprays into both nostrils 2 (two) times daily as needed for rhinitis. 05/28/20 06/13/20  Bing Neighbors, FNP  montelukast (SINGULAIR) 10 MG tablet Take 10 mg by mouth at bedtime.    10/11/19  [provider]    Family History Family History  Problem Relation Age of Onset  . Hypertension Mother     Social History Social History   Tobacco Use  . Smoking status: Never Smoker  . Smokeless tobacco: Never Used  Vaping Use  . Vaping Use: Never used  Substance Use Topics  . Alcohol use: Not Currently  . Drug use: Never     Allergies   Patient has no known allergies.   Review of Systems Review of Systems  Constitutional: Negative for activity change, appetite change, chills, fatigue and fever.  HENT: Positive for congestion, rhinorrhea, sinus pressure and sore throat. Negative for ear pain and trouble swallowing.   Eyes: Negative for discharge and redness.  Respiratory: Positive for cough. Negative for chest tightness and shortness of breath.   Cardiovascular: Negative for chest pain.  Gastrointestinal: Negative for abdominal pain, diarrhea, nausea and vomiting.  Musculoskeletal: Negative for myalgias.  Skin: Negative for rash.  Neurological: Negative for dizziness, light-headedness and headaches.  Physical Exam Triage Vital Signs ED Triage Vitals  Enc Vitals Group     BP 08/05/20 1544 124/86     Pulse Rate 08/05/20 1544 82     Resp 08/05/20 1544 18     Temp 08/05/20 1544 98.4 F (36.9 C)     Temp Source 08/05/20 1544 Oral     SpO2 08/05/20 1544 98 %     Weight --      Height --      Head Circumference --      Peak Flow --      Pain Score 08/05/20 1545 5     Pain Loc --      Pain Edu? --      Excl. in GC? --    No data found.  Updated Vital Signs BP 124/86 (BP Location: Left Arm)   Pulse 82   Temp 98.4 F (36.9 C) (Oral)   Resp 18    LMP 07/14/2020 (Exact Date)   SpO2 98%   Visual Acuity Right Eye Distance:   Left Eye Distance:   Bilateral Distance:    Right Eye Near:   Left Eye Near:    Bilateral Near:     Physical Exam Vitals and nursing note reviewed.  Constitutional:      Appearance: She is well-developed and well-nourished.     Comments: No acute distress  HENT:     Head: Normocephalic and atraumatic.     Ears:     Comments: Bilateral ears without tenderness to palpation of external auricle, tragus and mastoid, EAC's without erythema or swelling, TM's with good bony landmarks and cone of light. Non erythematous.     Nose: Nose normal.     Mouth/Throat:     Comments: Oral mucosa pink and moist, no tonsillar enlargement or exudate. Posterior pharynx patent and nonerythematous, no uvula deviation or swelling. Normal phonation. Eyes:     Conjunctiva/sclera: Conjunctivae normal.  Cardiovascular:     Rate and Rhythm: Normal rate and regular rhythm.  Pulmonary:     Effort: Pulmonary effort is normal. No respiratory distress.     Comments: Breathing comfortably at rest, CTABL, no wheezing, rales or other adventitious sounds auscultated Abdominal:     General: There is no distension.  Musculoskeletal:        General: Normal range of motion.     Cervical back: Neck supple.  Skin:    General: Skin is warm and dry.  Neurological:     Mental Status: She is alert and oriented to person, place, and time.  Psychiatric:        Mood and Affect: Mood and affect normal.      UC Treatments / Results  Labs (all labs ordered are listed, but only abnormal results are displayed) Labs Reviewed - No data to display  EKG   Radiology No results found.  Procedures Procedures (including critical care time)  Medications Ordered in UC Medications - No data to display  Initial Impression / Assessment and Plan / UC Course  I have reviewed the triage vital signs and the nursing notes.  Pertinent labs &  imaging results that were available during my care of the patient were reviewed by me and considered in my medical decision making (see chart for details).     Viral URI with cough-prior COVID test pending, exam reassuring today, lungs clear to auscultation.  Suspect continued viral etiology and recommending to continue symptomatic and supportive care.  Given this patient's second visit, did  opt to go ahead and provide prescription for Augmentin to cover sinusitis to fill on Monday if with an additional 3 to 4 days still not having any improvement of symptoms may treat sinusitis.  Discussed strict return precautions. Patient verbalized understanding and is agreeable with plan.  Final Clinical Impressions(s) / UC Diagnoses   Final diagnoses:  Viral URI with cough     Discharge Instructions     Begin daily cetirizine and Flonase for further relief of congestion and drainage/throat irritation May use cough syrup every 6-8 hours as needed for cough Rest and fluids May fill prescription for Augmentin on Monday if not improving or worsening despite an additional 3 to 4 days and use of the above    ED Prescriptions    Medication Sig Dispense Auth. Provider   fluticasone (FLONASE) 50 MCG/ACT nasal spray Place 1-2 sprays into both nostrils daily. 16 g Lathaniel Legate C, PA-C   Cetirizine HCl 10 MG CAPS Take 1 capsule (10 mg total) by mouth daily for 10 days. 10 capsule Eliseo Withers C, PA-C   brompheniramine-pseudoephedrine-DM 30-2-10 MG/5ML syrup Take 5 mLs by mouth 4 (four) times daily as needed. 120 mL Emelie Newsom C, PA-C   amoxicillin-clavulanate (AUGMENTIN) 875-125 MG tablet Take 1 tablet by mouth every 12 (twelve) hours for 7 days. 14 tablet Maui Britten, [place] C, PA-C     PDMP not reviewed this encounter.   Lew Dawes, [place] 08/05/20 1658

## 2020-08-05 NOTE — ED Triage Notes (Signed)
Patient states she has had a sore throat and cough and nasal congestion/drainage since her last visit. Pt states her symptoms did improve slightly but then worsened again. Pt states covid test was negative.

## 2020-09-02 ENCOUNTER — Other Ambulatory Visit: Payer: Self-pay

## 2020-09-02 ENCOUNTER — Encounter: Payer: Self-pay | Admitting: Emergency Medicine

## 2020-09-02 ENCOUNTER — Ambulatory Visit
Admission: EM | Admit: 2020-09-02 | Discharge: 2020-09-02 | Disposition: A | Discharge status: Home / Self Care | Payer: Medicaid Other | Attending: Family Medicine | Admitting: Family Medicine

## 2020-09-02 DIAGNOSIS — B9789 Other viral agents as the cause of diseases classified elsewhere: Secondary | ICD-10-CM

## 2020-09-02 DIAGNOSIS — R52 Pain, unspecified: Secondary | ICD-10-CM

## 2020-09-02 DIAGNOSIS — Z20822 Contact with and (suspected) exposure to covid-19: Secondary | ICD-10-CM | POA: Diagnosis not present

## 2020-09-02 NOTE — ED Triage Notes (Signed)
Pt here for body aches and sore throat x 4 days with possible covid exposure

## 2020-09-02 NOTE — Discharge Instructions (Addendum)
Continue to alternate ibuprofen and Tylenol as needed for fever management. Resume and continue cetirizine and Flonase as long as nasal symptoms are present as this is likely causing sore throat from postnasal drainage. Ibuprofen is also helpful with management of body aches. Covid test is pending.average test or take anywhere from 3 to 5 days to result however given your symptoms started 2 days ago you may return back to work on Monday.

## 2020-09-02 NOTE — ED Provider Notes (Signed)
[facility]    CSN: [id number] Arrival date & time: 09/02/20  0955      History   Chief Complaint Chief Complaint  Patient presents with  . Generalized Body Aches    HPI Lacey Perez is a 20 y.o. female.   HPI Patient in today for Covid testing due to recent exposure of Covid.  Patient has been seen here at urgent care at least 3 times in less than 6 weeks for similar complaint.  Last Covid test 08/03/2020 testing was negative.  She was subsequently treated for an upper respiratory infection with a course of antibiotics and symptom management medication 08/05/2020.  She presents back today for Covid testing after developing body aches and sore throat.  She has been exposed at work.  Medical history significant for that of asthma Past Medical History:  Diagnosis Date  . Asthma     There are no problems to display for this patient.   History reviewed. No pertinent surgical history.  OB History   No obstetric history on file.      Home Medications    Prior to Admission medications   Medication Sig Start Date End Date Taking? Authorizing Provider  brompheniramine-pseudoephedrine-DM 30-2-10 MG/5ML syrup Take 5 mLs by mouth 4 (four) times daily as needed. 08/05/20   Wieters, Hallie C, PA-C  Cetirizine HCl 10 MG CAPS Take 1 capsule (10 mg total) by mouth daily for 10 days. 08/05/20 08/15/20  Wieters, Hallie C, PA-C  fluticasone (FLONASE) 50 MCG/ACT nasal spray Place 1-2 sprays into both nostrils daily. 08/05/20   Wieters, Hallie C, PA-C  naproxen (NAPROSYN) 375 MG tablet Take 1 tablet (375 mg total) by mouth 2 (two) times daily. 05/28/20   Bing Neighbors, FNP  albuterol (PROVENTIL HFA;VENTOLIN HFA) 108 (90 BASE) MCG/ACT inhaler Inhale 2 puffs into the lungs every 6 (six) hours as needed. For breathing   10/11/19  [provider]  Fluticasone-Salmeterol (ADVAIR) 250-50 MCG/DOSE AEPB Inhale 1 puff into the lungs every 12 (twelve) hours.    10/11/19  [provider]  ipratropium (ATROVENT) 0.03 % nasal spray Place 2 sprays into both nostrils 2 (two) times daily as needed for rhinitis. 05/28/20 06/13/20  Bing Neighbors, FNP  montelukast (SINGULAIR) 10 MG tablet Take 10 mg by mouth at bedtime.    10/11/19  [provider]    Family History Family History  Problem Relation Age of Onset  . Hypertension Mother     Social History Social History   Tobacco Use  . Smoking status: Never Smoker  . Smokeless tobacco: Never Used  Vaping Use  . Vaping Use: Never used  Substance Use Topics  . Alcohol use: Not Currently  . Drug use: Never     Allergies   Patient has no known allergies.   Review of Systems Review of Systems Pertinent negatives listed in HPI Physical Exam Triage Vital Signs ED Triage Vitals [09/02/20 1006]  Enc Vitals Group     BP 120/83     Pulse Rate 73     Resp 18     Temp 98 F (36.7 C)     Temp Source Oral     SpO2 100 %     Weight      Height      Head Circumference      Peak Flow      Pain Score 5     Pain Loc      Pain Edu?  Excl. in GC?    No data found.  Updated Vital Signs BP 120/83 (BP Location: Right Arm)   Pulse 73   Temp 98 F (36.7 C) (Oral)   Resp 18   SpO2 100%   Visual Acuity Right Eye Distance:   Left Eye Distance:   Bilateral Distance:    Right Eye Near:   Left Eye Near:    Bilateral Near:     Physical Exam   General Appearance:    Alert, cooperative, no distress  HENT:   Normocephalic, ears normal, nares mucosal edema with congestion oropharynx normal  Eyes:    PERRL, conjunctiva/corneas clear, EOM's intact       Lungs:     Clear to auscultation bilaterally, respirations unlabored  Heart:    Regular rate and rhythm  Neurologic:   Awake, alert, oriented x 3. No apparent focal neurological           defect.           UC Treatments / Results  Labs (all labs ordered are listed, but only abnormal results are displayed) Labs Reviewed  NOVEL  CORONAVIRUS, NAA    EKG   Radiology No results found.  Procedures Procedures (including critical care time)  Medications Ordered in UC Medications - No data to display  Initial Impression / Assessment and Plan / UC Course  I have reviewed the triage vital signs and the nursing notes.  Pertinent labs & imaging results that were available during my care of the patient were reviewed by me and considered in my medical decision making (see chart for details).    COVID test pending. Symptom management warranted only.  Manage fever with Tylenol and ibuprofen.  Nasal symptoms with over-the-counter antihistamines recommended.  Treatment per discharge medications/discharge instructions.  Red flags/ER precautions given. The most current CDC isolation/quarantine recommendation advised.  Final Clinical Impressions(s) / UC Diagnoses   Final diagnoses:  Encounter for screening laboratory testing for COVID-19 virus  Generalized body aches  Sore throat (viral)     Discharge Instructions     Continue to alternate ibuprofen and Tylenol as needed for fever management. Resume and continue cetirizine and Flonase as long as nasal symptoms are present as this is likely causing sore throat from postnasal drainage. Ibuprofen is also helpful with management of body aches. Covid test is pending.average test or take anywhere from 3 to 5 days to result however given your symptoms started 2 days ago you may return back to work on Monday.    ED Prescriptions    None     PDMP not reviewed this encounter.   Bing Neighbors, FNP 09/02/20 1057

## 2020-09-03 LAB — NOVEL CORONAVIRUS, NAA: SARS-CoV-2, NAA: NOT DETECTED

## 2020-09-03 LAB — SARS-COV-2, NAA 2 DAY TAT

## 2020-12-15 ENCOUNTER — Ambulatory Visit
Admission: EM | Admit: 2020-12-15 | Discharge: 2020-12-15 | Disposition: A | Discharge status: Home / Self Care | Payer: Medicaid Other | Attending: Emergency Medicine | Admitting: Emergency Medicine

## 2020-12-15 ENCOUNTER — Encounter: Payer: Self-pay | Admitting: Emergency Medicine

## 2020-12-15 ENCOUNTER — Other Ambulatory Visit: Payer: Self-pay

## 2020-12-15 DIAGNOSIS — Z20822 Contact with and (suspected) exposure to covid-19: Secondary | ICD-10-CM

## 2020-12-15 DIAGNOSIS — J301 Allergic rhinitis due to pollen: Secondary | ICD-10-CM

## 2020-12-15 MED ORDER — IBUPROFEN 600 MG PO TABS: 600.0000 mg | ORAL_TABLET | Freq: Four times a day (QID) | ORAL | 0 refills | Status: DC | PRN

## 2020-12-15 MED ORDER — FLUTICASONE PROPIONATE 50 MCG/ACT NA SUSP: 2.0000 | Freq: Every day | NASAL | 0 refills | Status: AC

## 2020-12-15 NOTE — Discharge Instructions (Addendum)
COVID will be back in 1 to 2 days.  In the meantime, you can try an antihistamine/decongestant combination such as Claritin-D, Allegra-D, or Zyrtec-D.  If this does not work or if you have COVID, discontinue that and start Mucinex D instead.  Saline nasal irrigation with a Lloyd Huger Med rinse and distilled water as often as you want, Flonase, 600 mg of ibuprofen, 1000 mg of Tylenol 3-4 times a day as needed.  Follow-up with your primary care provider of your choice.  See list below  Below is a list of primary care practices who are taking new patients for you to follow-up with.  [facility] internal medicine clinic Ground Floor - [facility], [address] [place], [place], [place] [address] [phone]  [facility] [address] [place], [place] [address] [phone]  [facility] [address] [place], [place] [address] [phone]  [facility] [phone] [address] [place] [address]  [facility]: [address] [place] [address]  [phone]  [facility]: [address] [place] [address]   [phone]  [facility]: [address] [place] [address]  [phone]  [facility] : [address] [place] [address] [phone]  [facility]: [address] [place] [address] [phone]  [facility]: [address] [place] [address] [phone]  Dr. Oneal Grout [address] [facility] [place] [address]  [phone]  Go to www.goodrx.com  or www.costplusdrugs.com to look up your medications. This will give you a list of where you can find your prescriptions at the most affordable prices. Or  ask the pharmacist what the cash price is, or if they have any other discount programs available to help make your medication more affordable. This can be less expensive than what you would pay with insurance.

## 2020-12-15 NOTE — ED Triage Notes (Signed)
Pt here for body aches x 2 days; pt sts exposed at home to someone with covid

## 2020-12-15 NOTE — ED Provider Notes (Signed)
HPI  SUBJECTIVE:  Lacey Perez is a 20 y.o. female who presents with body aches, headaches, "sweats", nausea starting today.  She has had nasal congestion, itchy, watery eyes, sneezing rhinorrhea and a mild cough for over 2 weeks.  She denies fevers, chills, sore throat, loss of sense of smell or taste, shortness of breath, vomiting, diarrhea, abdominal pain.  Her roommate was diagnosed with COVID yesterday.  She does not take any medications on a regular basis.  She got the second COVID-vaccine.  She tried pushing fluids and taking Zyrtec.  Zyrtec helps with nasal congestion and rhinorrhea.  No aggravating factors.  No antipyretic in the past 6 hours.  She has a past medical history of seasonal allergies.  No other medical problems.  LMP: 4/19, had a negative pregnancy test 1 week ago.  PMD: None.   Past Medical History:  Diagnosis Date  . Asthma     History reviewed. No pertinent surgical history.  Family History  Problem Relation Age of Onset  . Hypertension Mother     Social History   Tobacco Use  . Smoking status: Never Smoker  . Smokeless tobacco: Never Used  Vaping Use  . Vaping Use: Never used  Substance Use Topics  . Alcohol use: Not Currently  . Drug use: Never    No current facility-administered medications for this encounter.  Current Outpatient Medications:  .  fluticasone (FLONASE) 50 MCG/ACT nasal spray, Place 2 sprays into both nostrils daily., Disp: 16 g, Rfl: 0 .  ibuprofen (ADVIL) 600 MG tablet, Take 1 tablet (600 mg total) by mouth every 6 (six) hours as needed., Disp: 30 tablet, Rfl: 0 .  Cetirizine HCl 10 MG CAPS, Take 1 capsule (10 mg total) by mouth daily for 10 days., Disp: 10 capsule, Rfl: 0  No Known Allergies   ROS  As noted in HPI.   Physical Exam  BP 118/85 (BP Location: Left Arm)   Pulse 68   Temp 97.8 F (36.6 C) (Oral)   Resp 18   SpO2 100%   Constitutional: Well developed, well nourished, no acute distress Eyes:  EOMI,  conjunctiva normal bilaterally HENT: Normocephalic, atraumatic,mucus membranes moist.  Positive mild nasal congestion.  Erythematous, swollen turbinates.  No maxillary, frontal sinus tenderness. Respiratory: Normal inspiratory effort, lungs clear bilaterally. Cardiovascular: Normal rate, regular rhythm, no murmurs rubs or gallops. GI: nondistended skin: No rash, skin intact Musculoskeletal: no deformities Neurologic: Alert & oriented x 3, no focal neuro deficits Psychiatric: Speech and behavior appropriate   ED Course   Medications - No data to display  Orders Placed This Encounter  Procedures  . Novel Coronavirus, NAA ([facility])    Standing Status:   Standing    Number of Occurrences:   1    Order Specific Question:   Patient immune status    Answer:   Normal    Order Specific Question:   Release to patient    Answer:   Immediate    No results found for this or any previous visit (from the past 24 hour(s)). No results found.  ED Clinical Impression  1. Allergic rhinitis due to pollen, unspecified seasonality   2. Encounter for screening laboratory testing for COVID-19 virus   3. Close exposure to COVID-19 virus      ED Assessment/Plan  Patient is reporting allergy type symptoms for the past several weeks but recently had a significant exposure to COVID.  COVID sent.  She can try an antihistamine/decongestant combination ,saline nasal irrigation,  Flonase, Tylenol/ibuprofen. If She has COVID or if the antihistamine/decongestant combination combination does not work, she will discontinue this and start Mucinex instead.  I do not believe that she would be a candidate for antiviral therapy since she is vaccinated and has no risk factors for severe illness.  Will provide primary care list and order assistance in finding a PMD.  Work note for today and tomorrow  Discussed labs,  MDM, treatment plan, and plan for follow-up with patient. patient agrees with plan.   Meds ordered this  encounter  Medications  . fluticasone (FLONASE) 50 MCG/ACT nasal spray    Sig: Place 2 sprays into both nostrils daily.    Dispense:  16 g    Refill:  0  . ibuprofen (ADVIL) 600 MG tablet    Sig: Take 1 tablet (600 mg total) by mouth every 6 (six) hours as needed.    Dispense:  30 tablet    Refill:  0    *This clinic note was created using Scientist, clinical (histocompatibility and immunogenetics). Therefore, there may be occasional mistakes despite careful proofreading.  ?    [name], MD 12/16/20 [phone]

## 2020-12-16 LAB — SARS-COV-2, NAA 2 DAY TAT

## 2020-12-16 LAB — NOVEL CORONAVIRUS, NAA: SARS-CoV-2, NAA: NOT DETECTED

## 2021-02-08 DIAGNOSIS — N390 Urinary tract infection, site not specified: Secondary | ICD-10-CM | POA: Diagnosis not present

## 2021-02-08 DIAGNOSIS — Z113 Encounter for screening for infections with a predominantly sexual mode of transmission: Secondary | ICD-10-CM | POA: Diagnosis not present

## 2021-02-28 DIAGNOSIS — M25522 Pain in left elbow: Secondary | ICD-10-CM | POA: Diagnosis not present

## 2021-02-28 DIAGNOSIS — M25519 Pain in unspecified shoulder: Secondary | ICD-10-CM | POA: Diagnosis not present

## 2021-02-28 DIAGNOSIS — M79602 Pain in left arm: Secondary | ICD-10-CM | POA: Diagnosis not present

## 2021-02-28 DIAGNOSIS — W19XXXA Unspecified fall, initial encounter: Secondary | ICD-10-CM | POA: Diagnosis not present

## 2021-03-16 ENCOUNTER — Ambulatory Visit
Admission: EM | Admit: 2021-03-16 | Discharge: 2021-03-16 | Disposition: A | Discharge status: Home / Self Care | Payer: Medicaid Other | Attending: Emergency Medicine | Admitting: Emergency Medicine

## 2021-03-16 ENCOUNTER — Other Ambulatory Visit: Payer: Self-pay

## 2021-03-16 ENCOUNTER — Encounter: Payer: Self-pay | Admitting: Emergency Medicine

## 2021-03-16 DIAGNOSIS — Z760 Encounter for issue of repeat prescription: Secondary | ICD-10-CM

## 2021-03-16 MED ORDER — VALACYCLOVIR HCL 1 G PO TABS: 1000.0000 mg | ORAL_TABLET | Freq: Every day | ORAL | 0 refills | Status: AC

## 2021-03-16 NOTE — Discharge Instructions (Addendum)
1 month valtrex refilled Please follow up with OBGYN for further refills

## 2021-03-16 NOTE — ED Provider Notes (Signed)
[facility]    CSN: [id number] Arrival date & time: 03/16/21  1551      History   Chief Complaint Chief Complaint  Patient presents with   Medication Refill    HPI Lacey Perez is a 20 y.o. female presenting today for medication refill.  Patient is requesting refill of Valtrex.  Reports that she takes Valtrex for prevention and takes daily, was prescribed this by OB/GYN.  She expresses ambiguity around specific HSV diagnosis, but reports that she is always taking this in the past.  HPI  Past Medical History:  Diagnosis Date   Asthma     There are no problems to display for this patient.   History reviewed. No pertinent surgical history.  OB History   No obstetric history on file.      Home Medications    Prior to Admission medications   Medication Sig Start Date End Date Taking? Authorizing Provider  valACYclovir (VALTREX) 1000 MG tablet Take 1 tablet (1,000 mg total) by mouth daily. 03/16/21 04/15/21 Yes Aalliyah Kilker C, PA-C  Cetirizine HCl 10 MG CAPS Take 1 capsule (10 mg total) by mouth daily for 10 days. 08/05/20 08/15/20  Nell Gales C, PA-C  fluticasone (FLONASE) 50 MCG/ACT nasal spray Place 2 sprays into both nostrils daily. 12/15/20   [name], MD  ibuprofen (ADVIL) 600 MG tablet Take 1 tablet (600 mg total) by mouth every 6 (six) hours as needed. 12/15/20   [name], MD  albuterol (PROVENTIL HFA;VENTOLIN HFA) 108 (90 BASE) MCG/ACT inhaler Inhale 2 puffs into the lungs every 6 (six) hours as needed. For breathing   10/11/19  [provider]  Fluticasone-Salmeterol (ADVAIR) 250-50 MCG/DOSE AEPB Inhale 1 puff into the lungs every 12 (twelve) hours.    10/11/19  [provider]  ipratropium (ATROVENT) 0.03 % nasal spray Place 2 sprays into both nostrils 2 (two) times daily as needed for rhinitis. 05/28/20 06/13/20  Bing Neighbors, FNP  montelukast (SINGULAIR) 10 MG tablet Take 10 mg by mouth at bedtime.     10/11/19  [provider]    Family History Family History  Problem Relation Age of Onset   Hypertension Mother     Social History Social History   Tobacco Use   Smoking status: Never   Smokeless tobacco: Never  Vaping Use   Vaping Use: Never used  Substance Use Topics   Alcohol use: Not Currently   Drug use: Never     Allergies   Patient has no known allergies.   Review of Systems Review of Systems  Constitutional:  Negative for fatigue and fever.  Eyes:  Negative for visual disturbance.  Respiratory:  Negative for shortness of breath.   Cardiovascular:  Negative for chest pain.  Gastrointestinal:  Negative for abdominal pain, nausea and vomiting.  Musculoskeletal:  Negative for arthralgias and joint swelling.  Skin:  Negative for color change, rash and wound.  Neurological:  Negative for dizziness, weakness, light-headedness and headaches.    Physical Exam Triage Vital Signs ED Triage Vitals  Enc Vitals Group     BP      Pulse      Resp      Temp      Temp src      SpO2      Weight      Height      Head Circumference      Peak Flow      Pain Score  Pain Loc      Pain Edu?      Excl. in GC?    No data found.  Updated Vital Signs BP (!) 128/94 (BP Location: Left Arm)   Pulse 80   Temp 98.6 F (37 C) (Oral)   Resp 18   SpO2 99%   Visual Acuity Right Eye Distance:   Left Eye Distance:   Bilateral Distance:    Right Eye Near:   Left Eye Near:    Bilateral Near:     Physical Exam Vitals and nursing note reviewed.  Constitutional:      Appearance: She is well-developed.     Comments: No acute distress  HENT:     Head: Normocephalic and atraumatic.     Nose: Nose normal.  Eyes:     Conjunctiva/sclera: Conjunctivae normal.  Cardiovascular:     Rate and Rhythm: Normal rate.  Pulmonary:     Effort: Pulmonary effort is normal. No respiratory distress.  Abdominal:     General: There is no distension.  Musculoskeletal:         General: Normal range of motion.     Cervical back: Neck supple.  Skin:    General: Skin is warm and dry.  Neurological:     Mental Status: She is alert and oriented to person, place, and time.     UC Treatments / Results  Labs (all labs ordered are listed, but only abnormal results are displayed) Labs Reviewed - No data to display  EKG   Radiology No results found.  Procedures Procedures (including critical care time)  Medications Ordered in UC Medications - No data to display  Initial Impression / Assessment and Plan / UC Course  I have reviewed the triage vital signs and the nursing notes.  Pertinent labs & imaging results that were available during my care of the patient were reviewed by me and considered in my medical decision making (see chart for details).     Provided patient with 1 month refill of Valtrex, discussed following up with OB/GYN for further refills.  Unclear if patient has officially been diagnosed with HSV or not, also discussed typically 500 mg as preventative dose over 1 g.  Discussed strict return precautions. Patient verbalized understanding and is agreeable with plan.  Final Clinical Impressions(s) / UC Diagnoses   Final diagnoses:  Medication refill     Discharge Instructions      1 month valtrex refilled Please follow up with OBGYN for further refills     ED Prescriptions     Medication Sig Dispense Auth. Provider   valACYclovir (VALTREX) 1000 MG tablet Take 1 tablet (1,000 mg total) by mouth daily. 30 tablet Yuji Walth, [place] C, PA-C      PDMP not reviewed this encounter.   Lew Dawes, PA-C 03/17/21 1256

## 2021-03-16 NOTE — ED Triage Notes (Signed)
Patient presents to [facility] wanting a "refill" of Valtrex.  States she has never been tested or officially been told she has herpes but had a "situation with a friend" one time and is not taking Valtrex as a "preventative."

## 2021-04-05 DIAGNOSIS — A6004 Herpesviral vulvovaginitis: Secondary | ICD-10-CM | POA: Diagnosis not present

## 2021-04-19 DIAGNOSIS — R519 Headache, unspecified: Secondary | ICD-10-CM | POA: Diagnosis not present

## 2021-04-19 DIAGNOSIS — J019 Acute sinusitis, unspecified: Secondary | ICD-10-CM | POA: Diagnosis not present

## 2021-05-27 ENCOUNTER — Emergency Department (HOSPITAL_COMMUNITY): Payer: Medicaid Other

## 2021-05-27 ENCOUNTER — Other Ambulatory Visit: Payer: Self-pay

## 2021-05-27 ENCOUNTER — Emergency Department (HOSPITAL_COMMUNITY)
Admission: EM | Admit: 2021-05-27 | Discharge: 2021-05-27 | Disposition: A | Discharge status: Home / Self Care | Payer: Medicaid Other | Attending: Emergency Medicine | Admitting: Emergency Medicine

## 2021-05-27 DIAGNOSIS — Z79899 Other long term (current) drug therapy: Secondary | ICD-10-CM | POA: Diagnosis not present

## 2021-05-27 DIAGNOSIS — J45909 Unspecified asthma, uncomplicated: Secondary | ICD-10-CM | POA: Diagnosis not present

## 2021-05-27 DIAGNOSIS — K625 Hemorrhage of anus and rectum: Secondary | ICD-10-CM | POA: Insufficient documentation

## 2021-05-27 DIAGNOSIS — R1084 Generalized abdominal pain: Secondary | ICD-10-CM | POA: Diagnosis not present

## 2021-05-27 DIAGNOSIS — R109 Unspecified abdominal pain: Secondary | ICD-10-CM | POA: Diagnosis not present

## 2021-05-27 LAB — CBC
HCT: 40.5 % (ref 36.0–46.0)
Hemoglobin: 13.4 g/dL (ref 12.0–15.0)
MCH: 34.7 pg — ABNORMAL HIGH (ref 26.0–34.0)
MCHC: 33.1 g/dL (ref 30.0–36.0)
MCV: 104.9 fL — ABNORMAL HIGH (ref 80.0–100.0)
Platelets: 297 10*3/uL (ref 150–400)
RBC: 3.86 MIL/uL — ABNORMAL LOW (ref 3.87–5.11)
RDW: 11.9 % (ref 11.5–15.5)
WBC: 9.1 10*3/uL (ref 4.0–10.5)
nRBC: 0 % (ref 0.0–0.2)

## 2021-05-27 LAB — URINALYSIS, ROUTINE W REFLEX MICROSCOPIC
Bilirubin Urine: NEGATIVE
Glucose, UA: NEGATIVE mg/dL
Hgb urine dipstick: NEGATIVE
Ketones, ur: NEGATIVE mg/dL
Leukocytes,Ua: NEGATIVE
Nitrite: NEGATIVE
Protein, ur: NEGATIVE mg/dL
Specific Gravity, Urine: 1.015 (ref 1.005–1.030)
pH: 6 (ref 5.0–8.0)

## 2021-05-27 LAB — I-STAT BETA HCG BLOOD, ED (MC, WL, AP ONLY): I-stat hCG, quantitative: <5 m[IU]/mL (ref <5)

## 2021-05-27 LAB — COMPREHENSIVE METABOLIC PANEL
ALT: 12 U/L (ref 0–44)
AST: 18 U/L (ref 15–41)
Albumin: 4 g/dL (ref 3.5–5.0)
Alkaline Phosphatase: 41 U/L (ref 38–126)
Anion gap: 6 (ref 5–15)
BUN: 8 mg/dL (ref 6–20)
CO2: 26 mmol/L (ref 22–32)
Calcium: 9.3 mg/dL (ref 8.9–10.3)
Chloride: 105 mmol/L (ref 98–111)
Creatinine, Ser: 0.7 mg/dL (ref 0.44–1.00)
GFR, Estimated: >60 mL/min (ref >60)
Glucose, Bld: 90 mg/dL (ref 70–99)
Potassium: 3.8 mmol/L (ref 3.5–5.1)
Sodium: 137 mmol/L (ref 135–145)
Total Bilirubin: 0.2 mg/dL — ABNORMAL LOW (ref 0.3–1.2)
Total Protein: 6.5 g/dL (ref 6.5–8.1)

## 2021-05-27 LAB — LIPASE, BLOOD: Lipase: 30 U/L (ref 11–51)

## 2021-05-27 MED ORDER — IOHEXOL 300 MG/ML  SOLN
80.0000 mL | Freq: Once | INTRAMUSCULAR | Status: AC | PRN
  Administered 2021-05-27: 80 mL via INTRAVENOUS

## 2021-05-27 NOTE — ED Notes (Signed)
Patient verbalizes understanding of d/c instructions. Opportunities for questions and answers were provided. Pt d/c from ED and ambulated to lobby.  

## 2021-05-27 NOTE — Discharge Instructions (Signed)
Your history, exam, work-up today were overall reassuring.  Your hemoglobin was not low despite the bleeding you have had on and off for quite some time.  Your CT scan did not show any concerning findings.  However, given your recurrent symptoms, we do feel is important for you to follow-up with gastroenterology for evaluation.  Please call either of the 2 gastroenterology groups to try to schedule a follow-up appointment.  If any symptoms change or worsen acutely, please return to the nearest emergency department.  Please rest and stay hydrated.

## 2021-05-27 NOTE — ED Provider Notes (Signed)
[facility] DEPART[facility] Provider Note   CSN: [id number] Arrival date & time: 05/27/21  [id number]     History Chief Complaint  Patient presents with   Abdominal Pain   Rectal Bleeding    Lacey Perez is a 20 y.o. female.  The history is provided by the patient and medical records. No language interpreter was used.  Abdominal Pain Pain location:  Generalized Pain quality: aching, bloating and cramping   Pain radiates to:  Does not radiate Pain severity:  Moderate Onset quality:  Gradual Duration:  2 days Timing:  Constant Progression:  Waxing and waning Chronicity:  Recurrent Context: not trauma   Relieved by:  Nothing Worsened by:  Nothing Ineffective treatments:  None tried Associated symptoms: hematochezia   Associated symptoms: no chest pain, no chills, no constipation, no cough, no diarrhea, no dysuria, no fatigue, no fever, no flatus, no hematemesis, no nausea, no shortness of breath, no vaginal bleeding, no vaginal discharge and no vomiting   Rectal Bleeding Quality:  Bright red Amount:  Moderate Duration:  2 days Timing:  Intermittent Chronicity:  Recurrent Context: spontaneously   Context: not hemorrhoids, not rectal injury and not rectal pain   Similar prior episodes: yes   Relieved by:  Nothing Worsened by:  Nothing Associated symptoms: abdominal pain   Associated symptoms: no dizziness, no epistaxis, no fever, no hematemesis, no light-headedness, no loss of consciousness, no recent illness and no vomiting       Past Medical History:  Diagnosis Date   Asthma     There are no problems to display for this patient.   No past surgical history on file.   OB History   No obstetric history on file.     Family History  Problem Relation Age of Onset   Hypertension Mother     Social History   Tobacco Use   Smoking status: Never   Smokeless tobacco: Never  Vaping Use   Vaping Use: Never used  Substance Use Topics    Alcohol use: Not Currently   Drug use: Never    Home Medications Prior to Admission medications   Medication Sig Start Date End Date Taking? Authorizing Provider  Cetirizine HCl 10 MG CAPS Take 1 capsule (10 mg total) by mouth daily for 10 days. 08/05/20 08/15/20  Wieters, Hallie C, PA-C  fluticasone (FLONASE) 50 MCG/ACT nasal spray Place 2 sprays into both nostrils daily. 12/15/20   Melynda Ripple, MD  ibuprofen (ADVIL) 600 MG tablet Take 1 tablet (600 mg total) by mouth every 6 (six) hours as needed. 12/15/20   Melynda Ripple, MD  albuterol (PROVENTIL HFA;VENTOLIN HFA) 108 (90 BASE) MCG/ACT inhaler Inhale 2 puffs into the lungs every 6 (six) hours as needed. For breathing   10/11/19  [provider]  Fluticasone-Salmeterol (ADVAIR) 250-50 MCG/DOSE AEPB Inhale 1 puff into the lungs every 12 (twelve) hours.    10/11/19  [provider]  ipratropium (ATROVENT) 0.03 % nasal spray Place 2 sprays into both nostrils 2 (two) times daily as needed for rhinitis. 05/28/20 06/13/20  Scot Jun, FNP  montelukast (SINGULAIR) 10 MG tablet Take 10 mg by mouth at bedtime.    10/11/19  [provider]    Allergies    Patient has no known allergies.  Review of Systems   Review of Systems  Constitutional:  Negative for chills, fatigue and fever.  HENT:  Negative for congestion and nosebleeds.   Respiratory:  Negative for cough, chest tightness, shortness  of breath, wheezing and stridor.   Cardiovascular:  Negative for chest pain and palpitations.  Gastrointestinal:  Positive for abdominal pain and hematochezia. Negative for constipation, diarrhea, flatus, hematemesis, nausea and vomiting.  Genitourinary:  Positive for flank pain (bialteral from the abd pain). Negative for dysuria, menstrual problem, pelvic pain, urgency, vaginal bleeding, vaginal discharge and vaginal pain.  Musculoskeletal:  Negative for back pain.  Neurological:  Negative for dizziness, loss of  consciousness, facial asymmetry, light-headedness and headaches.  Psychiatric/Behavioral:  Negative for agitation and confusion.   All other systems reviewed and are negative.  Physical Exam Updated Vital Signs BP 132/87 (BP Location: Right Arm)   Pulse 77   Temp 97.9 F (36.6 C)   Resp 12   LMP 05/14/2021   SpO2 100%   Physical Exam Vitals and nursing note reviewed.  Constitutional:      General: She is not in acute distress.    Appearance: She is well-developed. She is not ill-appearing, toxic-appearing or diaphoretic.  HENT:     Head: Normocephalic and atraumatic.  Eyes:     Extraocular Movements: Extraocular movements intact.     Conjunctiva/sclera: Conjunctivae normal.  Cardiovascular:     Rate and Rhythm: Normal rate and regular rhythm.     Heart sounds: No murmur heard. Pulmonary:     Effort: Pulmonary effort is normal. No respiratory distress.     Breath sounds: Normal breath sounds. No wheezing, rhonchi or rales.  Chest:     Chest wall: No tenderness.  Abdominal:     General: Abdomen is flat. Bowel sounds are normal. There is no distension.     Palpations: Abdomen is soft.     Tenderness: There is generalized abdominal tenderness. There is no right CVA tenderness or left CVA tenderness.  Genitourinary:    Comments: Rectal exam deferred by patient after it was offered.   Musculoskeletal:     Cervical back: Neck supple.  Skin:    General: Skin is warm and dry.     Capillary Refill: Capillary refill takes less than 2 seconds.  Neurological:     General: No focal deficit present.     Mental Status: She is alert.  Psychiatric:        Mood and Affect: Mood normal.    ED Results / Procedures / Treatments   Labs (all labs ordered are listed, but only abnormal results are displayed) Labs Reviewed  COMPREHENSIVE METABOLIC PANEL - Abnormal; Notable for the following components:      Result Value   Total Bilirubin 0.2 (*)    All other components within normal  limits  CBC - Abnormal; Notable for the following components:   RBC 3.86 (*)    MCV 104.9 (*)    MCH 34.7 (*)    All other components within normal limits  URINALYSIS, ROUTINE W REFLEX MICROSCOPIC - Abnormal; Notable for the following components:   APPearance HAZY (*)    All other components within normal limits  LIPASE, BLOOD  I-STAT BETA HCG BLOOD, ED ([facility], [facility], [facility] ONLY)    EKG None  Radiology CT ABDOMEN PELVIS W CONTRAST  Result Date: 05/27/2021 CLINICAL DATA:  20 year old female with abdominal pain. EXAM: CT ABDOMEN AND PELVIS WITHOUT CONTRAST TECHNIQUE: Multidetector CT imaging of the abdomen and pelvis was performed following the standard protocol without IV contrast. COMPARISON:  None. FINDINGS: Lower chest: No acute abnormality. Hepatobiliary: No focal liver abnormality is seen. No gallstones, gallbladder wall thickening, or biliary dilatation. Pancreas: Unremarkable. No pancreatic  ductal dilatation or surrounding inflammatory changes. Spleen: Normal in size without focal abnormality. Adrenals/Urinary Tract: Adrenal glands are unremarkable. Kidneys are normal, without renal calculi, focal lesion, or hydronephrosis. Bladder is unremarkable. Stomach/Bowel: Stomach is within normal limits. Appendix appears normal. No evidence of bowel wall thickening, distention, or inflammatory changes. Vascular/Lymphatic: No significant vascular findings are present. No enlarged abdominal or pelvic lymph nodes. Reproductive: Uterus and bilateral adnexa are unremarkable. Other: No abdominal wall hernia or abnormality. No abdominopelvic ascites. Musculoskeletal: No acute or significant osseous findings. IMPRESSION: No acute abdominopelvic abnormality. Ruthann Cancer, MD [facility] Electronically Signed   By: Ruthann Cancer M.D.   On: 05/27/2021 11:04    Procedures Procedures   Medications Ordered in ED Medications  iohexol (OMNIPAQUE) 300 MG/ML  solution 80 mL (80 mLs Intravenous Contrast Given 05/27/21 1058)    ED Course  I have reviewed the triage vital signs and the nursing notes.  Pertinent labs & imaging results that were available during my care of the patient were reviewed by me and considered in my medical decision making (see chart for details).    MDM Rules/Calculators/A&P                           Dniya Oursler is a 20 y.o. female with a past medical history sniffing for asthma who presents with worsened abdominal pain and intermittent rectal bleeding.  Patient reports that ever since basic training 2 years ago, patient's been having intermittent rectal bleeding and abdominal pains.  She was told that it was probably irritable bowel syndrome and has been managing conservatively.  She says that about once a month she has rectal bleeding that last for either few days or week and is intermittent with bowel movements.  She also occasionally has abdominal pains.  She reports that yesterday, her pain was more severe and was 7 out of 10 diffusely.  She describes no constipation or diarrhea but does report that bright red blood has been covering her stools and she show me a picture that does indeed look very much like gross blood.  She denies any rectal pain or hemorrhoids at this time.  She denies any vaginal discharge or vaginal bleeding.  No pelvic pain reported.  She denies any nausea, vomiting, or any emesis of blood.  She denies any other fevers, chills, or cough.  She does report some mild congestion.  She denies any known history of diverticulitis or diverticulosis and denies any family history of inflammatory bowel diseases such as Crohn's or ulcerative colitis.  Due to the pain worsening in the blood returning, patient was concerned and it was keeping her from working today due to the discomfort.  On exam, lungs were clear and chest was nontender.  Abdomen was diffusely tender but bowel sounds were appreciated.  We had a  shared decision conversation and she would prefer not to do a rectal exam as the image clearly showed blood and she denies any rectal pain or hemorrhoids.  Otherwise exam was reassuring and she does not appear pale.  Patient had some work-up in triage including some basic labs.  Patient is not anemic and does not have evidence of AKI or elevated liver function.  Her lipase is normal.  She will get a urinalysis and due to my high concern for either diverticulitis or bleeding from diverticulosis, we will get a CT scan t rule out abscess or other acute abnormality.  Patient is amenable to this plan.  Anticipate reassessment after imaging and work-up  Patient CT was reassuring.  She still did not want to perform a rectal exam today.  Globin was normal and labs otherwise reassuring.  Suspect either internal hemorrhoid versus some form of AVM or mild GI bleeding causing her symptoms.  We had a shared decision-making conversation and agree with discharge given her otherwise well appearance and reassuring vitals and work-up.  Patient will call gastroenterology for close follow-up and likely outpatient colonoscopy to further evaluate the bleeding.  Patient agrees with plan of care and had no questions or concerns.  Patient discharged in good condition.  Final Clinical Impression(s) / ED Diagnoses Final diagnoses:  Rectal bleeding  Generalized abdominal pain    Clinical Impression: 1. Rectal bleeding   2. Generalized abdominal pain     Disposition: Discharge  Condition: Good  I have discussed the results, Dx and Tx plan with the pt(& family if present). He/she/they expressed understanding and agree(s) with the plan. Discharge instructions discussed at great length. Strict return precautions discussed and pt &/or family have verbalized understanding of the instructions. No further questions at time of discharge.    New Prescriptions   No medications on file    Follow Up: Gastroenterology,  [address] [place] [address] [phone]     [facility] [place] [phone] [place] [id number] [place]          Kammi Hechler, Gwenyth Allegra, MD 05/27/21 [phone]

## 2021-05-27 NOTE — ED Triage Notes (Signed)
Pt. Stated, Lacey Perez had stomach pain and blood in my stool for about a year.

## 2021-05-29 ENCOUNTER — Other Ambulatory Visit: Payer: Self-pay

## 2021-05-29 ENCOUNTER — Encounter (HOSPITAL_COMMUNITY): Payer: Self-pay | Admitting: Emergency Medicine

## 2021-05-29 ENCOUNTER — Ambulatory Visit (HOSPITAL_COMMUNITY)
Admission: EM | Admit: 2021-05-29 | Discharge: 2021-05-29 | Disposition: A | Discharge status: Home / Self Care | Payer: Medicaid Other | Attending: Emergency Medicine | Admitting: Emergency Medicine

## 2021-05-29 DIAGNOSIS — M545 Low back pain, unspecified: Secondary | ICD-10-CM

## 2021-05-29 DIAGNOSIS — M542 Cervicalgia: Secondary | ICD-10-CM

## 2021-05-29 DIAGNOSIS — R519 Headache, unspecified: Secondary | ICD-10-CM

## 2021-05-29 DIAGNOSIS — M546 Pain in thoracic spine: Secondary | ICD-10-CM

## 2021-05-29 MED ORDER — CYCLOBENZAPRINE HCL 10 MG PO TABS: 10.0000 mg | ORAL_TABLET | Freq: Two times a day (BID) | ORAL | 0 refills | Status: DC | PRN

## 2021-05-29 NOTE — Discharge Instructions (Signed)
  Flexeril (cyclobenzaprine) is a muscle relaxer and may cause drowsiness. Do not drink alcohol, drive, or operate heavy machinery while taking.  Due to your history of stomach bleeding, NSAID (anti-inflammatory medication) such as aspirin, ibuprofen, and naproxen is not recommended until you discuss with your stomach doctor tomorrow.  In the meantime you can take over the counter acetaminophen (Tylenol) 500mg  every 4-6 hours for pain.   Follow up with primary care as needed.  Call 911 or go to the emergency department for worsening pain, or new/concerning symptoms- dizziness, severe pain, change in vision, nausea/vomiting, difficulty walking, or other concerning symptoms.
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
## 2021-05-30 ENCOUNTER — Ambulatory Visit: Payer: Medicaid Other | Admitting: Physician Assistant

## 2021-05-30 ENCOUNTER — Encounter: Payer: Self-pay | Admitting: Physician Assistant

## 2021-05-30 ENCOUNTER — Telehealth: Payer: Self-pay

## 2021-05-30 VITALS — BP 104/78 | HR 96 | Ht 66.0 in | Wt 145.5 lb

## 2021-05-30 DIAGNOSIS — K921 Melena: Secondary | ICD-10-CM

## 2021-05-30 DIAGNOSIS — R194 Change in bowel habit: Secondary | ICD-10-CM

## 2021-05-30 DIAGNOSIS — R1084 Generalized abdominal pain: Secondary | ICD-10-CM

## 2021-05-30 MED ORDER — PLENVU 140 G PO SOLR: 1.0000 | Freq: Once | ORAL | 0 refills | Status: AC

## 2021-05-30 NOTE — Patient Instructions (Signed)
You have been scheduled for a colonoscopy. Please follow written instructions given to you at your visit today.  Please pick up your prep supplies at the pharmacy within the next 1-3 days. If you use inhalers (even only as needed), please bring them with you on the day of your procedure.  If you are age 22 or older, your body mass index should be between 23-30. Your Body mass index is 23.48 kg/m. If this is out of the aforementioned range listed, please consider follow up with your Primary Care Provider.  If you are age 41 or younger, your body mass index should be between 19-25. Your Body mass index is 23.48 kg/m. If this is out of the aformentioned range listed, please consider follow up with your Primary Care Provider.   ________________________________________________________  The [facility] providers would like to encourage you to use [facility] to communicate with providers for non-urgent requests or questions.  Due to long hold times on the telephone, sending your provider a message by [facility] may be a faster and more efficient way to get a response.  Please allow 48 business hours for a response.  Please remember that this is for non-urgent requests.  _______________________________________________________

## 2021-05-30 NOTE — Telephone Encounter (Signed)
Transition Care Management Follow-up Telephone Call Date of discharge and from where: 05/27/2021- How have you been since you were released from the hospital? Patient is doing ok and about to go to her GI appointment today.  Any questions or concerns? No  Items Reviewed: Did the pt receive and understand the discharge instructions provided? Yes  Medications obtained and verified? Yes  Other? No  Any new allergies since your discharge? No  Dietary orders reviewed? No Do you have support at home? Yes   Home Care and Equipment/Supplies: Were home health services ordered? not applicable If so, what is the name of the agency? N/A  Has the agency set up a time to come to the patient's home? not applicable Were any new equipment or medical supplies ordered?  No What is the name of the medical supply agency? N/A Were you able to get the supplies/equipment? not applicable Do you have any questions related to the use of the equipment or supplies? No  Functional Questionnaire: (I = Independent and D = Dependent) ADLs: I  Bathing/Dressing- I  Meal Prep- I  Eating- I  Maintaining continence- I  Transferring/Ambulation- I  Managing Meds- I  Follow up appointments reviewed:  PCP Hospital f/u appt confirmed? No   Specialist Hospital f/u appt confirmed? Yes  Scheduled to see Gastro on 05/30/2021 @ 10:30am. Are transportation arrangements needed? No  If their condition worsens, is the pt aware to call PCP or go to the Emergency Dept.? Yes Was the patient provided with contact information for the PCP's office or ED? Yes Was to pt encouraged to call back with questions or concerns? Yes

## 2021-05-30 NOTE — Progress Notes (Signed)
I agree with the above note, plan 
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
## 2021-06-28 ENCOUNTER — Telehealth: Payer: Self-pay

## 2021-06-28 ENCOUNTER — Other Ambulatory Visit: Payer: Self-pay

## 2021-06-28 ENCOUNTER — Encounter: Payer: Self-pay | Admitting: Gastroenterology

## 2021-06-28 ENCOUNTER — Ambulatory Visit (AMBULATORY_SURGERY_CENTER): Payer: Medicaid Other | Admitting: Gastroenterology

## 2021-06-28 VITALS — BP 112/77 | HR 80 | Temp 96.2°F | Resp 14 | Ht 66.0 in | Wt 145.0 lb

## 2021-06-28 DIAGNOSIS — K5289 Other specified noninfective gastroenteritis and colitis: Secondary | ICD-10-CM | POA: Diagnosis not present

## 2021-06-28 DIAGNOSIS — K529 Noninfective gastroenteritis and colitis, unspecified: Secondary | ICD-10-CM

## 2021-06-28 DIAGNOSIS — K625 Hemorrhage of anus and rectum: Secondary | ICD-10-CM | POA: Diagnosis not present

## 2021-06-28 DIAGNOSIS — R109 Unspecified abdominal pain: Secondary | ICD-10-CM | POA: Diagnosis not present

## 2021-06-28 DIAGNOSIS — K921 Melena: Secondary | ICD-10-CM | POA: Diagnosis not present

## 2021-06-28 DIAGNOSIS — R1084 Generalized abdominal pain: Secondary | ICD-10-CM

## 2021-06-28 DIAGNOSIS — K6289 Other specified diseases of anus and rectum: Secondary | ICD-10-CM | POA: Diagnosis not present

## 2021-06-28 DIAGNOSIS — R197 Diarrhea, unspecified: Secondary | ICD-10-CM

## 2021-06-28 DIAGNOSIS — J45909 Unspecified asthma, uncomplicated: Secondary | ICD-10-CM | POA: Diagnosis not present

## 2021-06-28 DIAGNOSIS — R194 Change in bowel habit: Secondary | ICD-10-CM

## 2021-06-28 MED ORDER — SODIUM CHLORIDE 0.9 % IV SOLN: 500.0000 mL | Freq: Once | INTRAVENOUS | Status: DC

## 2021-06-28 MED ORDER — MESALAMINE 4 G RE ENEM: 4.0000 g | ENEMA | Freq: Every day | RECTAL | Status: DC

## 2021-06-28 MED ORDER — MESALAMINE 1.2 G PO TBEC: 4.8000 g | DELAYED_RELEASE_TABLET | Freq: Every day | ORAL | 11 refills | Status: DC

## 2021-06-28 NOTE — Progress Notes (Signed)
Called to room to assist during endoscopic procedure.  Patient ID and intended procedure confirmed with present staff. Received instructions for my participation in the procedure from the performing physician.  

## 2021-06-28 NOTE — Patient Instructions (Signed)
Thank you for allowing [place] to care for you today! Biopsy results will be back in 7-10 days.  We will notify you of the results. Please follow up with Dr Christella Hartigan in 6-8 weeks.  We will reach out to set up this appointment. You are going to need to take some prescriptions to help with this colon inflammation.        Start Lialda 1.2 gram pills, take 4 pills once daily , you will have a monthly refill for this.         Also start Rowasa enema 4 grams.  Use one enema every night at bedtime , there are 3 refills for this. Return to your normal daily activities tomorrow.     YOU HAD AN ENDOSCOPIC PROCEDURE TODAY AT THE [facility]:   Refer to the procedure report that was given to you for any specific questions about what was found during the examination.  If the procedure report does not answer your questions, please call your gastroenterologist to clarify.  If you requested that your care partner not be given the details of your procedure findings, then the procedure report has been included in a sealed envelope for you to review at your convenience later.  YOU SHOULD EXPECT: Some feelings of bloating in the abdomen. Passage of more gas than usual.  Walking can help get rid of the air that was put into your GI tract during the procedure and reduce the bloating. If you had a lower endoscopy (such as a colonoscopy or flexible sigmoidoscopy) you may notice spotting of blood in your stool or on the toilet paper. If you underwent a bowel prep for your procedure, you may not have a normal bowel movement for a few days.  Please Note:  You might notice some irritation and congestion in your nose or some drainage.  This is from the oxygen used during your procedure.  There is no need for concern and it should clear up in a day or so.  SYMPTOMS TO REPORT IMMEDIATELY:  Following lower endoscopy (colonoscopy or flexible sigmoidoscopy):  Excessive amounts of blood in the stool  Significant tenderness  or worsening of abdominal pains  Swelling of the abdomen that is new, acute  Fever of 100F or higher.  For urgent or emergent issues, a gastroenterologist can be reached at any hour by calling [phone]. Do not use MyChart messaging for urgent concerns.    DIET:  We do recommend a small meal at first, but then you may proceed to your regular diet.  Drink plenty of fluids but you should avoid alcoholic beverages for 24 hours.  ACTIVITY:  You should plan to take it easy for the rest of today and you should NOT DRIVE or use heavy machinery until tomorrow (because of the sedation medicines used during the test).    FOLLOW UP: Our staff will call the number listed on your records 48-72 hours following your procedure to check on you and address any questions or concerns that you may have regarding the information given to you following your procedure. If we do not reach you, we will leave a message.  We will attempt to reach you two times.  During this call, we will ask if you have developed any symptoms of COVID 19. If you develop any symptoms (ie: fever, flu-like symptoms, shortness of breath, cough etc.) before then, please call [phone].  If you test positive for Covid 19 in the 2 weeks post procedure, please call  and report this information to [place].    If any biopsies were taken you will be contacted by phone or by letter within the next 1-3 weeks.  Please call us at [phone] if you have not heard about the biopsies in 3 weeks.    SIGNATURES/CONFIDENTIALITY: You and/or your care partner have signed paperwork which will be entered into your electronic medical record.  These signatures attest to the fact that that the information above on your After Visit Summary has been reviewed and is understood.  Full responsibility of the confidentiality of this discharge information lies with you and/or your care-partner.

## 2021-06-28 NOTE — Progress Notes (Signed)
  The recent H&P (dated 05/30/2021) was reviewed, the patient was examined and there is no change in the patients condition since that H&P was completed.   [name]  06/28/2021, 10:18 AM

## 2021-06-28 NOTE — Op Note (Signed)
[facility] Patient Name: Lacey Perez Procedure Date: 06/28/2021 10:13 AM MRN: [id number] Endoscopist: Milus Banister , MD Age: 20 Referring MD:  Date of Birth: 12-28-2000 Gender: Female Account #: [id number] Procedure:                Colonoscopy Indications:              loose stools, intermittent rectal bleeding Medicines:                Monitored Anesthesia Care Procedure:                Pre-Anesthesia Assessment:                           - Prior to the procedure, a History and Physical                            was performed, and patient medications and                            allergies were reviewed. The patient's tolerance of                            previous anesthesia was also reviewed. The risks                            and benefits of the procedure and the sedation                            options and risks were discussed with the patient.                            All questions were answered, and informed consent                            was obtained. Prior Anticoagulants: The patient has                            taken no previous anticoagulant or antiplatelet                            agents. ASA Grade Assessment: II - A patient with                            mild systemic disease. After reviewing the risks                            and benefits, the patient was deemed in                            satisfactory condition to undergo the procedure.                           After obtaining informed consent, the colonoscope  was passed under direct vision. Throughout the                            procedure, the patient's blood pressure, pulse, and                            oxygen saturations were monitored continuously. The                            Olympus CF-HQ190L (Serial# [id number]) Colonoscope was                            introduced through the anus and advanced to the the                            cecum,  identified by appendiceal orifice and                            ileocecal valve. The colonoscopy was performed                            without difficulty. The patient tolerated the                            procedure well. The quality of the bowel                            preparation was good. The ileocecal valve,                            appendiceal orifice, and rectum were photographed. Scope In: 10:33:35 AM Scope Out: 10:52:03 AM Scope Withdrawal Time: 0 hours 15 minutes 1 second  Total Procedure Duration: 0 hours 18 minutes 28 seconds  Findings:                 The termina ileum was not intubated due to                            tortuosity, no evident strictures or infllammation.                           The right colon was normal, biopsied. jar 1.                           The transverse and descending segments were normal,                            biopsied. jar 2                           There was moderate inflammation in the most distal                            5cm of the rectum, biopsied. jar 4. Then the  colonic mucosa was completely normal for about                            10cm. Then the colon was again moderately inflamed                            from 15cm to 35cm from the anus with fairly                            distinct transitions proximally and distally,                            biopsied. jar 3.                           Inflammation characterized by erosions was found.                            Biopsies were taken with a cold forceps for                            histology.                           The exam was otherwise without abnormality on                            direct and retroflexion views. Complications:            No immediate complications. Estimated blood loss:                            None. Estimated Blood Loss:     Estimated blood loss: none. Impression:               - Indeterminant colitis,  biopsied extensively.                           - Unable to intubate the terminal ileum due to                            tortuosity. Recommendation:           - Patient has a contact number available for                            emergencies. The signs and symptoms of potential                            delayed complications were discussed with the                            patient. Return to normal activities tomorrow.                            Written discharge instructions were provided to the  patient.                           - Resume previous diet.                           - Continue present medications. New start lialda                            1.2gm pills, take 4 pills once daily, disp1 month                            with 11 refills. Also new start rowasa 4gm enema.                            Use one enema every night at bedtime, disp 30 with                            3 refills.                           - Return office visit with Dr. Ardis Hughs in 6-8 weeks.                           - Await pathology results. Milus Banister, MD 06/28/2021 11:01:56 AM This report has been signed electronically.

## 2021-06-28 NOTE — Telephone Encounter (Signed)
PA Case: [id number], Status: Approved, Coverage Starts on: 06/28/2021 - Coverage Ends on: 06/28/2022

## 2021-06-28 NOTE — Progress Notes (Signed)
[facility]  Pt's states no medical or surgical changes since previsit or office visit.  

## 2021-06-28 NOTE — Progress Notes (Signed)
Report given to PACU, vss 

## 2021-06-29 ENCOUNTER — Other Ambulatory Visit: Payer: Self-pay

## 2021-06-29 ENCOUNTER — Encounter: Payer: Self-pay | Admitting: Gastroenterology

## 2021-06-29 MED ORDER — MESALAMINE 4 G RE ENEM: 4.0000 g | ENEMA | Freq: Every day | RECTAL | 3 refills | Status: DC

## 2021-06-30 ENCOUNTER — Telehealth: Payer: Self-pay | Admitting: *Deleted

## 2021-06-30 NOTE — Telephone Encounter (Signed)
  Follow up Call-  Call back number 06/28/2021  Post procedure Call Back phone  # [phone]  Permission to leave phone message Yes  Some recent data might be hidden     Patient questions:  Do you have a fever, pain , or abdominal swelling? No. Pain Score  0 *  Have you tolerated food without any problems? Yes.    Have you been able to return to your normal activities? Yes.    Do you have any questions about your discharge instructions: Diet   No. Medications  No. Follow up visit  No.  Do you have questions or concerns about your Care? No.  Actions: * If pain score is 4 or above: No action needed, pain <4.  Have you developed a fever since your procedure? no  2.   Have you had an respiratory symptoms (SOB or cough) since your procedure? no  3.   Have you tested positive for COVID 19 since your procedure no  4.   Have you had any family members/close contacts diagnosed with the COVID 19 since your procedure?  no   If yes to any of these questions please route to Laverna Peace, RN and Karlton Lemon, RN

## 2021-07-01 ENCOUNTER — Encounter: Payer: Self-pay | Admitting: Gastroenterology

## 2021-07-04 NOTE — Telephone Encounter (Signed)
Sorry that was my error.

## 2021-08-15 DIAGNOSIS — Z113 Encounter for screening for infections with a predominantly sexual mode of transmission: Secondary | ICD-10-CM | POA: Diagnosis not present

## 2021-08-15 DIAGNOSIS — Z111 Encounter for screening for respiratory tuberculosis: Secondary | ICD-10-CM | POA: Diagnosis not present

## 2021-08-17 ENCOUNTER — Ambulatory Visit: Payer: Medicaid Other | Admitting: Gastroenterology

## 2021-08-24 DIAGNOSIS — M25561 Pain in right knee: Secondary | ICD-10-CM | POA: Diagnosis not present

## 2021-09-04 ENCOUNTER — Encounter: Payer: Self-pay | Admitting: Gastroenterology

## 2021-10-04 ENCOUNTER — Encounter: Payer: Self-pay | Admitting: Gastroenterology

## 2021-10-04 ENCOUNTER — Ambulatory Visit: Payer: Medicaid Other | Admitting: Gastroenterology

## 2021-10-04 VITALS — BP 90/68 | HR 68

## 2021-10-04 DIAGNOSIS — K51519 Left sided colitis with unspecified complications: Secondary | ICD-10-CM | POA: Diagnosis not present

## 2021-10-04 MED ORDER — MESALAMINE 4 G RE ENEM: 4.0000 g | ENEMA | RECTAL | 3 refills | Status: DC

## 2021-10-04 NOTE — Progress Notes (Signed)
Review of pertinent gastrointestinal problems: ?1.  Indeterminate colitis (left-sided segmental).  Diarrhea, minor bleeding led to colonoscopy 06/2021 terminal ileum not intubated due to tortuosity, no evidence of strictures or inflammation.  Right colon was normal, segmental inflammation in the left colon including distal rectum and also for a 20 cm segment in the sigmoid.  Multiple biopsies taken from throughout and path showed chronic inflammation in the left sided segmental changes.  Lialda 4 pills once daily started, also Rowasa enema once daily started. ? ? ?HPI: ?This is a very pleasant 21 year old woman who is here with her mother today. ? ?Since starting the Lialda 4 pills once daily and the Rowasa nightly her symptoms have significantly improved.  She was having 6-7 loose intermittently bloody stools on a daily basis prior to start of those medicines and since then she is having 1 or 2 formed nonbloody stools 95% of the time.  She has had some breakthrough loose stools and minor bleeding but that only occurs once or twice a month. ? ?She does have some abdominal discomforts.  These are generalized throughout her abdomen.  These seem to be improved when she moves her bowels. ? ?She works for the [employer] and I have forms to fill out for that. ? ?ROS: complete GI ROS as described in HPI, all other review negative. ? ?Constitutional:  No unintentional weight loss ? ? ?Past Medical History:  ?Diagnosis Date  ? Asthma   ? ? ?Past Surgical History:  ?Procedure Laterality Date  ? TONSILLECTOMY AND ADENOIDECTOMY    ? WISDOM TOOTH EXTRACTION    ? ? ?Current Outpatient Medications  ?Medication Instructions  ? acetaminophen (TYLENOL) 2,000 mg, Oral, Every 6 hours PRN  ? aspirin-acetaminophen-caffeine (EXCEDRIN MIGRAINE) 250-250-65 MG tablet 2 tablets, Oral, Every 6 hours PRN  ? Cetirizine HCl 10 mg, Oral, Daily  ? cyclobenzaprine (FLEXERIL) 10 mg, Oral, 2 times daily PRN  ? fluticasone (FLONASE) 50 MCG/ACT  nasal spray 2 sprays, Each Nare, Daily  ? ibuprofen (ADVIL) 600 mg, Oral, Every 6 hours PRN  ? mesalamine (LIALDA) 4.8 g, Oral, Daily with breakfast  ? mesalamine (ROWASA) 4 g, Rectal, Daily at bedtime  ? Probiotic Product (PROBIOTIC PO) 1 capsule, Oral, Daily  ? ? ?Allergies as of 10/04/2021  ? (No Known Allergies)  ? ? ?Family History  ?Problem Relation Age of Onset  ? Hypertension Mother   ? Asthma Mother   ? Fibroids Mother   ? Asthma Father   ? Colon polyps Maternal Grandmother   ? Breast cancer Paternal Grandmother   ? Diabetes Paternal Grandmother   ? Colon cancer Neg Hx   ? Esophageal cancer Neg Hx   ? Rectal cancer Neg Hx   ? Stomach cancer Neg Hx   ? ? ?Social History  ? ?Socioeconomic History  ? Marital status: Single  ?  Spouse name: Not on file  ? Number of children: 0  ? Years of education: Not on file  ? Highest education level: Not on file  ?Occupational History  ? Not on file  ?Tobacco Use  ? Smoking status: Never  ? Smokeless tobacco: Never  ?Vaping Use  ? Vaping Use: Every day  ? Substances: Nicotine, Flavoring  ?Substance and Sexual Activity  ? Alcohol use: Yes  ?  Comment: rarely  ? Drug use: Never  ? Sexual activity: Not Currently  ?  Birth control/protection: None  ?Other Topics Concern  ? Not on file  ?Social History Narrative  ? Not  on file  ? ?Social Determinants of Health  ? ?Financial Resource Strain: Not on file  ?Food Insecurity: Not on file  ?Transportation Needs: Not on file  ?Physical Activity: Not on file  ?Stress: Not on file  ?Social Connections: Not on file  ?Intimate Partner Violence: Not on file  ? ? ? ?Physical Exam: ?BP 90/68 (BP Location: Left Arm, Patient Position: Sitting, Cuff Size: Normal)   Pulse 68  ?Constitutional: generally well-appearing ?Psychiatric: alert and oriented x3 ?Abdomen: soft, nontender, nondistended, no obvious ascites, no peritoneal signs, normal bowel sounds ?No peripheral edema noted in lower extremities ? ?Assessment and plan: ?21 y.o. female with  left-sided colitis, indeterminant IBD ? ?She has had a good response to oral and rectal mesalamine.  I recommended she stop the Rowasa by tapering to every other day for a week and then completely stop.  She will continue the Lialda 4 pills once daily and she will return to see me in 3 months and sooner if needed.  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 ? ?I filled out her [employer] form. ? ?Please see the "Patient Instructions" section for addition details about the plan. ? ?Rob Bunting, MD ?[facility] ?10/04/2021, 9:19 AM ? ? ?Total time on date of encounter was 25 minutes (this included time spent preparing to see the patient reviewing records; obtaining and/or reviewing separately obtained history; performing a medically appropriate exam and/or evaluation; counseling and educating the patient and family if present; ordering medications, tests or procedures if applicable; and documenting clinical information in the health record). ? ? ? ?

## 2021-10-04 NOTE — Patient Instructions (Addendum)
If you are age 23 or younger, your body mass index should be between 19-25. Your There is no height or weight on file to calculate BMI. If this is out of the aformentioned range listed, please consider follow up with your Primary Care Provider.  ?________________________________________________________ ? ?The [facility] providers would like to encourage you to use [facility] to communicate with providers for non-urgent requests or questions.  Due to long hold times on the telephone, sending your provider a message by [facility] may be a faster and more efficient way to get a response.  Please allow 48 business hours for a response.  Please remember that this is for non-urgent requests.  ?_______________________________________________________ ? ?CONTINUE: Lialda ? ?Taper off of Mesalamine enema by using every other day and then discontinue after 1 week.  ? ?You will need a follow up appointment in June 2023.  Please call our office at [phone] to schedule this appointment.   ? ?Thank you for entrusting me with your care and choosing [facility]. ? ?Dr Christella Hartigan ? ?

## 2021-10-06 ENCOUNTER — Telehealth: Payer: Self-pay | Admitting: Gastroenterology

## 2021-10-06 ENCOUNTER — Telehealth: Payer: Medicaid Other | Admitting: Physician Assistant

## 2021-10-06 NOTE — Telephone Encounter (Signed)
The pt is complaining of bloody diarrhea with 7/10 upper abd cramping/pain. She is in tears on the phone when describing her symptoms.  She is taking lialda 4.8 g daily.  She did stop the Rowasa enemas.  She reports off/on nausea.  She says the pain has worsened since her office visit on 10/04/21.  Please advise  ?

## 2021-10-06 NOTE — Telephone Encounter (Signed)
Patient called stating that she has had diarrhea for the last 2 days with blood, and she is having really bad stomach pains. Patient is seeking advice what she can do. Please advise.  ?

## 2021-10-06 NOTE — Progress Notes (Signed)
Based on what you shared with me, I feel your condition warrants further evaluation and I recommend that you be seen in a face to face visit. ? ?Giving your history of inflammatory bowel disease and concern for flare, you need to be evaluated in person as this is above the scope of what we can treat via e-visit or urgent care video visit. You need to reach out to your specialist, PCP or use links below to be evaluated in-person at one of our urgent cares.  ?  ?NOTE: There will be NO CHARGE for this eVisit ?  ?If you are having a true medical emergency please call 911.   ?  ? For an urgent face to face visit, [facility] has six urgent care centers for your convenience:  ?  ? [facility] ?Get Driving Directions ?[phone] ?[address] ?[place], [place] [address] ?  ? [facility]) ?Get Driving Directions ?[phone] ?[address] ?[place], [place] [address] ? ?[facility] [place] [facility] ?Get Driving Directions ?[address] [place],  [place]  [address] ? ?[facility] ?Get Driving Directions ?[phone] ?[address], [address] ?[place], [place] [address] ?  ?[facility] [place] ?Get Driving Directions  ?[phone] ?[address] ?[address] ?[place],  [address] ?  ?[facility] at [facility] ?Get Driving Directions ?[phone] ?[address] ?[place],  [address] ? ?Your MyChart E-visit questionnaire answers were reviewed by a board certified advanced clinical practitioner to complete your personal care plan based on your specific symptoms.  Thank you for using e-Visits. ?  ? ?

## 2021-10-06 NOTE — Telephone Encounter (Signed)
I spoke with the pt and advised her of the options for evaluation.  She prefers to go to the ED for expedited care.  She is still crying in pain when speaking with her on the phone.  She states she will go now.   ?

## 2021-10-11 ENCOUNTER — Encounter: Payer: Self-pay | Admitting: Gastroenterology

## 2021-11-03 ENCOUNTER — Other Ambulatory Visit (INDEPENDENT_AMBULATORY_CARE_PROVIDER_SITE_OTHER): Payer: Medicaid Other

## 2021-11-03 ENCOUNTER — Other Ambulatory Visit: Payer: Self-pay

## 2021-11-03 ENCOUNTER — Telehealth: Payer: Self-pay | Admitting: Gastroenterology

## 2021-11-03 DIAGNOSIS — K921 Melena: Secondary | ICD-10-CM | POA: Diagnosis not present

## 2021-11-03 DIAGNOSIS — R112 Nausea with vomiting, unspecified: Secondary | ICD-10-CM

## 2021-11-03 DIAGNOSIS — R194 Change in bowel habit: Secondary | ICD-10-CM | POA: Diagnosis not present

## 2021-11-03 DIAGNOSIS — R197 Diarrhea, unspecified: Secondary | ICD-10-CM | POA: Diagnosis not present

## 2021-11-03 DIAGNOSIS — K51519 Left sided colitis with unspecified complications: Secondary | ICD-10-CM

## 2021-11-03 DIAGNOSIS — R1084 Generalized abdominal pain: Secondary | ICD-10-CM

## 2021-11-03 LAB — CBC WITH DIFFERENTIAL/PLATELET
Basophils Absolute: 0 10*3/uL (ref 0.0–0.1)
Basophils Relative: 0.5 % (ref 0.0–3.0)
Eosinophils Absolute: 0.8 10*3/uL — ABNORMAL HIGH (ref 0.0–0.7)
Eosinophils Relative: 8.2 % — ABNORMAL HIGH (ref 0.0–5.0)
HCT: 41.9 % (ref 36.0–46.0)
Hemoglobin: 14.1 g/dL (ref 12.0–15.0)
Lymphocytes Relative: 28.7 % (ref 12.0–46.0)
Lymphs Abs: 2.7 10*3/uL (ref 0.7–4.0)
MCHC: 33.8 g/dL (ref 30.0–36.0)
MCV: 101 fl — ABNORMAL HIGH (ref 78.0–100.0)
Monocytes Absolute: 0.8 10*3/uL (ref 0.1–1.0)
Monocytes Relative: 9.2 % (ref 3.0–12.0)
Neutro Abs: 5 10*3/uL (ref 1.4–7.7)
Neutrophils Relative %: 53.4 % (ref 43.0–77.0)
Platelets: 268 10*3/uL (ref 150.0–400.0)
RBC: 4.15 Mil/uL (ref 3.87–5.11)
RDW: 12.3 % (ref 11.5–15.5)
WBC: 9.3 10*3/uL (ref 4.0–10.5)

## 2021-11-03 LAB — COMPREHENSIVE METABOLIC PANEL
ALT: 8 U/L (ref 0–35)
AST: 13 U/L (ref 0–37)
Albumin: 4.2 g/dL (ref 3.5–5.2)
Alkaline Phosphatase: 43 U/L (ref 39–117)
BUN: 6 mg/dL (ref 6–23)
CO2: 25 mEq/L (ref 19–32)
Calcium: 9.1 mg/dL (ref 8.4–10.5)
Chloride: 106 mEq/L (ref 96–112)
Creatinine, Ser: 0.69 mg/dL (ref 0.40–1.20)
GFR: 123.08 mL/min (ref >60.00)
Glucose, Bld: 86 mg/dL (ref 70–99)
Potassium: 3.8 mEq/L (ref 3.5–5.1)
Sodium: 139 mEq/L (ref 135–145)
Total Bilirubin: 0.3 mg/dL (ref 0.2–1.2)
Total Protein: 6.7 g/dL (ref 6.0–8.3)

## 2021-11-03 LAB — HIGH SENSITIVITY CRP: CRP, High Sensitivity: 1.89 mg/L (ref 0.000–5.000)

## 2021-11-03 NOTE — Telephone Encounter (Signed)
The labs have been entered and the CT was changed.  The pt is aware to come in today for the labs. The CT has been entered as STAT.  Appt made for the pt to see Estill Bamberg on 11/08/21 at 3 pm.  The pt has been advised to let me know if she has not heard from the schedulers today for CT hopefully tomorrow. The pt has been advised of the information and verbalized understanding.    ?

## 2021-11-03 NOTE — Telephone Encounter (Signed)
DOD ? ?Previous notes reviewed ?Colonoscopy consistent with indeterminate colitis-looks like skip lesions, may have Crohn's.  TI not intubated since it was very tortuous ? ?Plan: ?-Agree with CBC/CMP as ordered for today. ?-Please add CRP as well ?-Check stool for GI pathogens, C. Difficile ASAP ?-I see CT Abdo/pelvis has been ordered.  Patty, can you change that to CTE and get it done as soon as available ?-Also needs appointment in APP clinic next week.  If stool studies are neg, would likely need prednisone/budesonide ? ?RG ?

## 2021-11-03 NOTE — Telephone Encounter (Signed)
The pt is calling with the same complaints per our last phone call on 3/16. Abd pain, nausea and bloody diarrhea since last office visit on 10/04/21 with DJ.   She is taking her Lialda 4.8 g daily.  She has watery bloody diarrhea several times daily. She is unsure how many per day but says its "a lot".  She says the abd pain may not be as bad as it was on the last phone call but the diarrhea and nausea may be some worse.  She reports no fever, chills.  She will go on clear liquids for now. She was advised to have CT and labs or go to the ED for eval on 3/16 and she decided on ED but did not go.  She agrees to CT and labs now.  Those orders have been entered and CT order sent to the schedulers to be set up sooner rather than later. She was told that she should go to the ED if her symptoms worsen or she has bleeding independent of stool.   She will come in today for labs.  She has a follow up on 5/10 that was previously scheduled. FYI Dr Christella Hartigan. I will have the DOD (Dr Chales Abrahams) review as well.  Thank you Dr Chales Abrahams for reviewing.  ?

## 2021-11-03 NOTE — Telephone Encounter (Signed)
Patient called seeking advise. Per patient, diarrhea for 2 weeks with some blood. Patient states she's waiting for medication to kick in, but nothing so far. Patient is scheduled for an OV 11/30/21. Please advise. ?

## 2021-11-04 ENCOUNTER — Ambulatory Visit (HOSPITAL_COMMUNITY)
Admission: RE | Admit: 2021-11-04 | Discharge: 2021-11-04 | Disposition: A | Discharge status: Home / Self Care | Payer: Medicaid Other | Source: Ambulatory Visit | Attending: Gastroenterology | Admitting: Gastroenterology

## 2021-11-04 DIAGNOSIS — K51519 Left sided colitis with unspecified complications: Secondary | ICD-10-CM | POA: Diagnosis not present

## 2021-11-04 DIAGNOSIS — R194 Change in bowel habit: Secondary | ICD-10-CM | POA: Insufficient documentation

## 2021-11-04 DIAGNOSIS — K921 Melena: Secondary | ICD-10-CM | POA: Insufficient documentation

## 2021-11-04 DIAGNOSIS — R109 Unspecified abdominal pain: Secondary | ICD-10-CM | POA: Diagnosis not present

## 2021-11-04 DIAGNOSIS — R197 Diarrhea, unspecified: Secondary | ICD-10-CM | POA: Insufficient documentation

## 2021-11-04 DIAGNOSIS — R112 Nausea with vomiting, unspecified: Secondary | ICD-10-CM | POA: Diagnosis not present

## 2021-11-04 DIAGNOSIS — R1084 Generalized abdominal pain: Secondary | ICD-10-CM | POA: Insufficient documentation

## 2021-11-04 MED ORDER — IOHEXOL 300 MG/ML  SOLN
100.0000 mL | Freq: Once | INTRAMUSCULAR | Status: AC | PRN
  Administered 2021-11-04: 100 mL via INTRAVENOUS

## 2021-11-04 MED ORDER — BARIUM SULFATE 0.1 % PO SUSP
1350.0000 mL | Freq: Once | ORAL | Status: AC
  Administered 2021-11-04: 1350 mL via ORAL

## 2021-11-04 MED ORDER — SODIUM CHLORIDE (PF) 0.9 % IJ SOLN
INTRAMUSCULAR | Status: AC
  Filled 2021-11-04: qty 50

## 2021-11-05 LAB — CLOSTRIDIUM DIFFICILE BY PCR: Toxigenic C. Difficile by PCR: NEGATIVE

## 2021-11-06 LAB — GI PROFILE, STOOL, PCR

## 2021-11-07 NOTE — Progress Notes (Signed)
? ? ?11/08/2021 ?Lacey Perez ?[id number] ?02/07/2001 ? ?Primary GI doctor: Dr. Ardis Hughs ? ?ASSESSMENT AND PLAN:  ? ?Lacey Perez was seen today for diarrhea and abdominal pain. ? ?Diagnoses and all orders for this visit: ? ?Left sided ulcerative colitis with rectal bleeding (HCC) with pain in AB ?-     dicyclomine (BENTYL) 20 MG tablet; Take 1 tablet (20 mg total) by mouth 3 (three) times daily as needed for spasms. ?-     Hepatitis B surface antigen; Future ?-     QuantiFERON-TB Gold Plus; Future ?Reassuring labs without anemia, leukocytosis ?11/04/2021 CT AB/pelvis enteroscopy colitis without complications  ?GI profile 11/03/2021 positive norovirus, C. difficile negative. ?Patient has been on Lialda, will add back on rowasa which patient has been off since 03/21.  ?unknown if patient will have response to 5-ASA or may need  budesonide versus eventual increased biologic therapy. ?Will check HBV surface ag,  and Quantiferon Gold  ?Will check with Dr. Ardis Hughs on possible budesonide versus prednisone for possible flare secondary to norovirus versus possible incomplete response to 5ASA since patient had continuing symptoms  ?Likely component of IBS too, sent in dicyclomine ? ? ? ?History of Present Illness:  ?21 y.o. female presents for evaluation of left-sided ulcerative colitis, diagnosed via colonoscopy 06/28/2021 due to hematochezia.  ? ?Most recent colonoscopy was 06/28/2021 at diagnosis, showed extensive colitis, patient had torturous colon unable to evaluate TI, started on Lialda  at that time. ?10/06/2021 with bloody diarrhea a 7 out of 10 abdominal cramping and pain.  1-2 weeks.  ?Instructed to go to the ER to expedite evaluation ?10/11/2021 appointment Dr. Ardis Hughs with improvement of her symptoms, continued on Lialda 4 tablets once daily, taper Rowasa every other day and completely stop.  Was having good response to oral and rectal mesalamine.  Minor abdominal pain at that time slight overlap of IBS. ?11/03/2021 due  to continuing abdominal pain, diarrhea, had labs/stool and CTE done ?04/13 CRP negative , CMET unremarkable, no anemia, leukocyotosis, MCV 101 ?04/13 positive norovirus, negative C. difficile ?11/04/2021 CT abdomen pelvis with contrast enteroscopy showed severe circumferential wall thickening and enhancement descending colon through rectum, sigmoid colon relatively featureless, consistent with ulcerative colitis.  No evidence of fistula, abscess or stricture. ? ?Current History ?Patient states compared to prior diagnosis she was having issues for months at a time, has had some improvement.  ?From Jan/Feb she would have intermittent symptoms She has AB bloating, feels diarrhea and bleeding 1-2 weeks at a time, worse with [employer] duty with working out.  ?Always comes back sick from [employer] duty.  ?Will have constant upper AB pain, has AB bloating. Has nausea with BM only at time. No GERD, no mouth ulcers.  ?Can have days without diarrhea and occ formed stools.   ?The patient is currently having 10 bowel movements per day which are loose or formed at time, will small to moderate BRB/clotted blood. Marland Kitchen   ?The patient admits to abdominal pain, located in the epigastrium. The pain is described as cramping, constant, no radiation, nothing better or worse.    ?The patient has not had any extraintestinal symptoms ? ?Labs:  ?11/03/2021 WBC 9.3 HGB 14.1 MCV 101.0 Platelets 268.0 ?11/03/2021 AST 13 ALT 8 Alkphos 43 TBili 0.3 ? ? ?Current Medications:  ? ? ? ?Current Outpatient Medications (Respiratory):  ?  fluticasone (FLONASE) 50 MCG/ACT nasal spray, Place 2 sprays into both nostrils daily. (Patient taking differently: Place 2 sprays into both nostrils daily as needed for allergies.) ?  Cetirizine HCl 10 MG CAPS, Take 1 capsule (10 mg total) by mouth daily for 10 days. ? ?Current Outpatient Medications (Analgesics):  ?  acetaminophen (TYLENOL) 500 MG tablet, Take 2,000 mg by mouth every 6 (six) hours as needed for mild pain. ?   aspirin-acetaminophen-caffeine (EXCEDRIN MIGRAINE) 250-250-65 MG tablet, Take 2 tablets by mouth every 6 (six) hours as needed for headache. ?  ibuprofen (ADVIL) 600 MG tablet, Take 1 tablet (600 mg total) by mouth every 6 (six) hours as needed. ? ? ?Current Outpatient Medications (Other):  ?  cyclobenzaprine (FLEXERIL) 10 MG tablet, Take 1 tablet (10 mg total) by mouth 2 (two) times daily as needed for muscle spasms. ?  dicyclomine (BENTYL) 20 MG tablet, Take 1 tablet (20 mg total) by mouth 3 (three) times daily as needed for spasms. ?  mesalamine (LIALDA) 1.2 g EC tablet, Take 4 tablets (4.8 g total) by mouth daily with breakfast. ?  Probiotic Product (PROBIOTIC PO), Take 1 capsule by mouth daily. ?  mesalamine (ROWASA) 4 g enema, Place 60 mLs (4 g total) rectally every other day. Discontinue after 1 week. ? ?Surgical History:  ?She  has a past surgical history that includes Wisdom tooth extraction and Tonsillectomy and adenoidectomy. ?Family History:  ?Her family history includes Asthma in her father and mother; Breast cancer in her paternal grandmother; Colon polyps in her maternal grandmother; Diabetes in her paternal grandmother; Fibroids in her mother; Hypertension in her mother. ?Social History:  ? reports that she has never smoked. She has never used smokeless tobacco. She reports current alcohol use. She reports that she does not use drugs. ? ?Current Medications, Allergies, Past Medical History, Past Surgical History, Family History and Social History were reviewed in [employer] record. ? ?Physical Exam: ?BP 112/66   Pulse 64   Ht 5\' 6"  (1.676 m)   Wt 147 lb (66.7 kg)   BMI 23.73 kg/m?  ?General:   Pleasant, well developed female in no acute distress ?Heart:  Regular rate and rhythm; no murmurs ?Pulm: Clear anteriorly; no wheezing ?Abdomen:  Soft, Non-distended AB, Active bowel sounds. mild tenderness in the RUQ and in the RLQ. Without guarding and Without rebound, No  organomegaly appreciated. ?Extremities:  without  edema. ?Neurologic:  Alert and  oriented x4;  No focal deficits.  ?Psych:  Cooperative. Normal mood and affect. ? ? ?Vladimir Crofts, PA-C ?11/08/21 ?

## 2021-11-08 ENCOUNTER — Encounter: Payer: Self-pay | Admitting: Physician Assistant

## 2021-11-08 ENCOUNTER — Other Ambulatory Visit: Payer: Medicaid Other

## 2021-11-08 ENCOUNTER — Ambulatory Visit: Payer: Medicaid Other | Admitting: Physician Assistant

## 2021-11-08 VITALS — BP 112/66 | HR 64 | Ht 66.0 in | Wt 147.0 lb

## 2021-11-08 DIAGNOSIS — K51511 Left sided colitis with rectal bleeding: Secondary | ICD-10-CM

## 2021-11-08 DIAGNOSIS — R1010 Upper abdominal pain, unspecified: Secondary | ICD-10-CM | POA: Diagnosis not present

## 2021-11-08 MED ORDER — DICYCLOMINE HCL 20 MG PO TABS: 20.0000 mg | ORAL_TABLET | Freq: Three times a day (TID) | ORAL | 2 refills | Status: DC | PRN

## 2021-11-08 NOTE — Patient Instructions (Addendum)
IBDandme website ?Some great apps to help track your stools ? ?We have sent the following medications to your pharmacy for you to pick up at your convenience: dicyclomine.  ? ?Your provider has requested that you go to the basement level for lab work before leaving today. Press "B" on the elevator. The lab is located at the first door on the left as you exit the elevator. ? ?Due to recent changes in healthcare laws, you may see the results of your imaging and laboratory studies on MyChart before your provider has had a chance to review them.  We understand that in some cases there may be results that are confusing or concerning to you. Not all laboratory results come back in the same time frame and the provider may be waiting for multiple results in order to interpret others.  Please give [place] 48 hours in order for your provider to thoroughly review all the results before contacting the office for clarification of your results.  ? ?The [facility] providers would like to encourage you to use [facility] to communicate with providers for non-urgent requests or questions.  Due to long hold times on the telephone, sending your provider a message by [facility] may be a faster and more efficient way to get a response.  Please allow 48 business hours for a response.  Please remember that this is for non-urgent requests.  ? ? ? ? ?

## 2021-11-09 LAB — HEPATITIS B SURFACE ANTIGEN: Hepatitis B Surface Ag: NONREACTIVE

## 2021-11-09 MED ORDER — OMEPRAZOLE 20 MG PO CPDR: 20.0000 mg | DELAYED_RELEASE_CAPSULE | Freq: Every day | ORAL | 0 refills | Status: DC

## 2021-11-09 MED ORDER — PREDNISONE 10 MG PO TABS: ORAL_TABLET | ORAL | 0 refills | Status: AC

## 2021-11-09 NOTE — Addendum Note (Signed)
Addended by: Quentin Mulling on: 11/09/2021 08:00 AM ? ? Modules accepted: Orders ? ?

## 2021-11-09 NOTE — Progress Notes (Signed)
Lacey Perez, thanks for helping with her.  The norovirus is probably already out of her system and I suspect that this is just inflammation related to her IBD, possibly worsened by that recent viral infection.  I would like to get her on prednisone 40 mg once daily.  She should take this for 2 weeks and then start tapering by 10 mg/week.  Looks like she already has an appointment with me in early May and she should keep that appointment.  If she is able to easily taper off the prednisone and maintain on just the mesalamine products that would be great.  If not then we will have to discuss increasing her therapy to either immunomodulators or Biologics.  Thanks again. ?

## 2021-11-11 LAB — QUANTIFERON-TB GOLD PLUS
Mitogen-NIL: >10 IU/mL
NIL: 0.04 IU/mL
QuantiFERON-TB Gold Plus: NEGATIVE
TB1-NIL: <0 IU/mL
TB2-NIL: 0 IU/mL

## 2021-11-30 ENCOUNTER — Encounter: Payer: Self-pay | Admitting: Gastroenterology

## 2021-11-30 ENCOUNTER — Telehealth: Payer: Self-pay

## 2021-11-30 ENCOUNTER — Ambulatory Visit: Payer: Medicaid Other | Admitting: Gastroenterology

## 2021-11-30 ENCOUNTER — Other Ambulatory Visit: Payer: Medicaid Other

## 2021-11-30 DIAGNOSIS — R195 Other fecal abnormalities: Secondary | ICD-10-CM

## 2021-11-30 DIAGNOSIS — A049 Bacterial intestinal infection, unspecified: Secondary | ICD-10-CM

## 2021-11-30 DIAGNOSIS — K51819 Other ulcerative colitis with unspecified complications: Secondary | ICD-10-CM | POA: Diagnosis not present

## 2021-11-30 MED ORDER — HUMIRA (2 PEN) 40 MG/0.4ML ~~LOC~~ AJKT: 1.0000 "pen " | AUTO-INJECTOR | SUBCUTANEOUS | 6 refills | Status: DC

## 2021-11-30 MED ORDER — HUMIRA-CD/UC/HS STARTER 80 MG/0.8ML ~~LOC~~ AJKT
AUTO-INJECTOR | SUBCUTANEOUS | 0 refills | Status: DC
Start: 2021-11-30 — End: 2022-06-08

## 2021-11-30 NOTE — Telephone Encounter (Signed)
Dr Christella Hartigan would like to get this patient started on Humira. ? ?Thank you ?

## 2021-11-30 NOTE — Progress Notes (Signed)
Review of pertinent gastrointestinal problems: ?1.  Indeterminate colitis (left-sided segmental).  Diarrhea, minor bleeding led to colonoscopy 06/2021 terminal ileum not intubated due to tortuosity, no evidence of strictures or inflammation.  Right colon was normal, segmental inflammation in the left colon including distal rectum and also for a 20 cm segment in the sigmoid.  Multiple biopsies taken from throughout and path showed chronic inflammation in the left sided segmental changes.  Lialda 4 pills once daily started, also Rowasa enema once daily started. ?Labs 10/2021: Hepatitis B surface antigen negative, Quant Farren gold TB testing negative, hepatitis B surface ?2.  Acute norovirus infection 10/2021 ? ? ?HPI: ?This is a very pleasant 21 year old woman ? ? ?I last saw her here in the office about 2 months ago.  She was actually doing quite well on oral and rectal mesalamine. ? ?She eventually read presented for care 1 month later due to continuing abdominal pain diarrhea.  She had stool test, blood work and CT scan.  Stool test showed that she was positive for acute norovirus.  CT scan 10/2021 reported "severe circumferential wall thickening enhancement of the descending colon through the rectum consistent with ulcerative colitis."  She saw Estill Bamberg here in the office who did a battery of blood work. ? ?She was recommended to restart her rectal mesalamine and also to start prednisone 40 mg once daily. ? ?For the past month now she has been on prednisone 40 mg once daily, rectal mesalamine Rowasa enemas, Lialda 4 pills once daily and she has not really noticed any improvement or symptoms. ? ?She is still having 7-10 urgent loose, nearly always bloody stools daily.  She has abdominal discomforts chronically.  She has nocturnal symptoms.  She is able to keep her weight. ? ?She is in the [employer] and is getting a lot of paperwork from them about her condition. ? ? ? ?ROS: complete GI ROS as described in HPI, all  other review negative. ? ?Constitutional:  No unintentional weight loss ? ? ?Past Medical History:  ?Diagnosis Date  ? Asthma   ? ? ?Past Surgical History:  ?Procedure Laterality Date  ? TONSILLECTOMY AND ADENOIDECTOMY    ? WISDOM TOOTH EXTRACTION    ? ? ?Current Outpatient Medications  ?Medication Instructions  ? acetaminophen (TYLENOL) 2,000 mg, Oral, Every 6 hours PRN  ? aspirin-acetaminophen-caffeine (EXCEDRIN MIGRAINE) 250-250-65 MG tablet 2 tablets, Oral, Every 6 hours PRN  ? Cetirizine HCl 10 mg, Oral, Daily  ? cyclobenzaprine (FLEXERIL) 10 mg, Oral, 2 times daily PRN  ? dicyclomine (BENTYL) 20 mg, Oral, 3 times daily PRN  ? fluticasone (FLONASE) 50 MCG/ACT nasal spray 2 sprays, Each Nare, Daily  ? ibuprofen (ADVIL) 600 mg, Oral, Every 6 hours PRN  ? mesalamine (LIALDA) 4.8 g, Oral, Daily with breakfast  ? mesalamine (ROWASA) 4 g, Rectal, Every other day, Discontinue after 1 week.  ? omeprazole (PRILOSEC) 20 mg, Oral, Daily before breakfast, 30 mins before food ideally  ? predniSONE (DELTASONE) 10 MG tablet Take 4 tablets (40 mg total) by mouth daily with breakfast for 14 days, THEN 3 tablets (30 mg total) daily with breakfast for 7 days, THEN 2 tablets (20 mg total) daily with breakfast for 7 days, THEN 1 tablet (10 mg total) daily with breakfast for 7 days, THEN 0.5 tablets (5 mg total) daily with breakfast for 8 days.  ? Probiotic Product (PROBIOTIC PO) 1 capsule, Oral, Daily  ? ? ?Allergies as of 11/30/2021  ? (No Known Allergies)  ? ? ?  Family History  ?Problem Relation Age of Onset  ? Hypertension Mother   ? Asthma Mother   ? Fibroids Mother   ? Asthma Father   ? Colon polyps Maternal Grandmother   ? Breast cancer Paternal Grandmother   ? Diabetes Paternal Grandmother   ? Colon cancer Neg Hx   ? Esophageal cancer Neg Hx   ? Rectal cancer Neg Hx   ? Stomach cancer Neg Hx   ? ? ?Social History  ? ?Socioeconomic History  ? Marital status: Single  ?  Spouse name: Not on file  ? Number of children: 0  ? Years  of education: Not on file  ? Highest education level: Not on file  ?Occupational History  ? Not on file  ?Tobacco Use  ? Smoking status: Never  ? Smokeless tobacco: Never  ?Vaping Use  ? Vaping Use: Every day  ? Substances: Nicotine, Flavoring  ?Substance and Sexual Activity  ? Alcohol use: Yes  ?  Comment: rarely  ? Drug use: Never  ? Sexual activity: Not Currently  ?  Birth control/protection: None  ?Other Topics Concern  ? Not on file  ?Social History Narrative  ? Not on file  ? ?Social Determinants of Health  ? ?Financial Resource Strain: Not on file  ?Food Insecurity: Not on file  ?Transportation Needs: Not on file  ?Physical Activity: Not on file  ?Stress: Not on file  ?Social Connections: Not on file  ?Intimate Partner Violence: Not on file  ? ? ? ?Physical Exam: ?Wt 146 lb (66.2 kg)   BMI 23.57 kg/m?  ?Constitutional: generally well-appearing ?Psychiatric: alert and oriented x3 ?Abdomen: soft, nontender, nondistended, no obvious ascites, no peritoneal signs, normal bowel sounds ?No peripheral edema noted in lower extremities ? ?Assessment and plan: ?21 y.o. female with left-sided ulcerative colitis, severe ? ?Her symptoms are failing to remit despite prednisone 40 mg, Lialda full-strength, Rowasa enemas.  I thought that the acute norovirus infection might have been contributing but I am not able to capture her with the prednisone after 1 month postinfection.  I am going to check GI pathogen panel 1 more time. ? ?Today we discussed increasing her medical management by adding biologic, TNF alpha inhibitor.  I specifically discussed Remicade and Humira and she would prefer Humira since these are shots that she can give herself. ? ?She brought with her today vaccination list and it looks like she has been very well vaccinated through the [facility] but she has not been vaccinated for pneumococcus and so we we will order that for her now.   ? ?She and I discussed that ulcerative colitis is a chronic  condition and that she will be on medicines for this for many years likely.  She brought some paperwork with her for the [facility] I am happy to fill it out. ? ?She will return to see me in 2 months and hopefully we will get her started on Humira before then. ? ?Please see the "Patient Instructions" section for addition details about the plan. ? ?Owens Loffler, MD ?[facility] ?11/30/2021, 8:45 AM ? ? ?Total time on date of encounter was 40 minutes (this included time spent preparing to see the patient reviewing records; obtaining and/or reviewing separately obtained history; performing a medically appropriate exam and/or evaluation; counseling and educating the patient and family if present; ordering medications, tests or procedures if applicable; and documenting clinical information in the health record). ? ?

## 2021-11-30 NOTE — Patient Instructions (Addendum)
If you are age 23 or younger, your body mass index should be between 19-25. Your Body mass index is 23.57 kg/m?Marland Kitchen If this is out of the aformentioned range listed, please consider follow up with your Primary Care Provider.  ?________________________________________________________ ? ?The [facility] providers would like to encourage you to use [facility] to communicate with providers for non-urgent requests or questions.  Due to long hold times on the telephone, sending your provider a message by [facility] may be a faster and more efficient way to get a response.  Please allow 48 business hours for a response.  Please remember that this is for non-urgent requests.  ?_______________________________________________________ ? ?Your provider has requested that you go to the basement level for lab work before leaving today. Press "B" on the elevator. The lab is located at the first door on the left as you exit the elevator. ? ?Due to recent changes in healthcare laws, you may see the results of your imaging and laboratory studies on MyChart before your provider has had a chance to review them.  We understand that in some cases there may be results that are confusing or concerning to you. Not all laboratory results come back in the same time frame and the provider may be waiting for multiple results in order to interpret others.  Please give [place] 48 hours in order for your provider to thoroughly review all the results before contacting the office for clarification of your results.  ? ?You are scheduled to follow up on 01-31-22 at 8:50am. ? ?Patty, RN will contact you about getting started on Humira. ? ?You need to have a pneumonia vaccine (Prevnar 20).  We do not have this in our office.  Please go to local pharmacy to have this vaccine given. ? ?Thank you for entrusting me with your care and choosing [facility]. ? ?Dr Ardis Hughs ? ?

## 2021-11-30 NOTE — Telephone Encounter (Signed)
Humira prescription has been sent to [facility] with office notes, demographics, and labs.  ?

## 2021-12-03 LAB — GI PROFILE, STOOL, PCR

## 2021-12-07 ENCOUNTER — Other Ambulatory Visit: Payer: Self-pay | Admitting: *Deleted

## 2021-12-07 NOTE — Patient Outreach (Signed)
?Medicaid Managed Care   ?Nurse Care Manager Note ? ?12/07/2021 ?Name:  Lacey Perez MRN:  [id number] DOB:  [birth year] ? ?Lacey Perez is an 21 y.o. year old female who is a primary patient of Pcp, No.  The Medicaid Managed Care Coordination team was consulted for assistance with:    ?Ulcerative Colitis ? ?Lacey Perez was given information about Medicaid Managed Care Coordination team services today. Lacey Perez Patient agreed to services and verbal consent obtained. ? ?Engaged with patient by telephone for initial visit in response to provider referral for case management and/or care coordination services.  ? ?Assessments/Interventions:  Review of past medical history, allergies, medications, health status, including review of consultants reports, laboratory and other test data, was performed as part of comprehensive evaluation and provision of chronic care management services. ? ?SDOH (Social Determinants of Health) assessments and interventions performed: ?SDOH Interventions   ? ?Flowsheet Row Most Recent Value  ?SDOH Interventions   ?Food Insecurity Interventions Intervention Not Indicated  ?Housing Interventions Intervention Not Indicated  ?Transportation Interventions Intervention Not Indicated  ? ?  ? ? ?Care Plan ? ?No Known Allergies ? ?Medications Reviewed Today   ? ? Reviewed by Heidi Dach, RN (Registered Nurse) on 12/07/21 at 1524  Med List Status: <None>  ? ?Medication Order Taking? Sig Documenting Provider Last Dose Status Informant  ?acetaminophen (TYLENOL) 500 MG tablet [id number] Yes Take 2,000 mg by mouth every 6 (six) hours as needed for mild pain. [provider] Taking Active Self  ?Adalimumab (HUMIRA PEN) 40 MG/0.4ML PNKT [id number] No Inject 1 pen. into the skin every 14 (fourteen) days.  ?Patient not taking: Reported on 12/07/2021  ? [name], MD Not Taking Active   ?         ?Med Note (Taygan Connell A   Wed Dec 07, 2021  3:22 PM) Waiting on PA  ?Adalimumab  (HUMIRA PEN-CD/UC/HS STARTER) 80 MG/0.8ML PNKT [id number] No Inject 2 pens (160 mg total) under the skin on day 1, inject 1 pen (80 mg total) under the skin on day 15  ?Patient not taking: Reported on 12/07/2021  ? [name], MD Not Taking Active   ?         ?Med Note (Carols Clemence A   Wed Dec 07, 2021  3:22 PM) Has not started, waiting on PA  ?  Discontinued 10/11/19 1240 aspirin-acetaminophen-caffeine (EXCEDRIN MIGRAINE) 250-250-65 MG tablet [id number] Yes Take 2 tablets by mouth every 6 (six) hours as needed for headache. [provider] Taking Active Self  ?cyclobenzaprine (FLEXERIL) 10 MG tablet [id number] Yes Take 1 tablet (10 mg total) by mouth 2 (two) times daily as needed for muscle spasms. Lurene Shadow, PA-C Taking Active   ?dicyclomine (BENTYL) 20 MG tablet [id number] Yes Take 1 tablet (20 mg total) by mouth 3 (three) times daily as needed for spasms. [name], PA-C Taking Active   ?fluticasone (FLONASE) 50 MCG/ACT nasal spray [id number] Yes Place 2 sprays into both nostrils daily.  ?Patient taking differently: Place 2 sprays into both nostrils daily as needed for allergies.  ? [name], MD Taking Active   ?  Discontinued 10/11/19 1240 ibuprofen (ADVIL) 600 MG tablet [id number] Yes Take 1 tablet (600 mg total) by mouth every 6 (six) hours as needed. [name], MD Taking Active   ?  Discontinued 06/13/20 1454   ?mesalamine (LIALDA) 1.2 g EC tablet [id number] Yes Take 4 tablets (4.8 g total) by mouth daily with breakfast. Rob Bunting  P, MD Taking Active   ?mesalamine (ROWASA) 4 g enema [id number] Yes Place 60 mLs (4 g total) rectally every other day. Discontinue after 1 week. [name], MD Taking Active   ?  Discontinued 10/11/19 1240 omeprazole (PRILOSEC) 20 MG capsule [id number] Yes Take 1 capsule (20 mg total) by mouth daily before breakfast. 30 mins before food ideally [name], PA-C Taking Active   ?predniSONE (DELTASONE) 10 MG tablet [id number] Yes  Take 4 tablets (40 mg total) by mouth daily with breakfast for 14 days, THEN 3 tablets (30 mg total) daily with breakfast for 7 days, THEN 2 tablets (20 mg total) daily with breakfast for 7 days, THEN 1 tablet (10 mg total) daily with breakfast for 7 days, THEN 0.5 tablets (5 mg total) daily with breakfast for 8 days. [name], PA-C Taking Active   ?Probiotic Product (PROBIOTIC PO) [id number] Yes Take 1 capsule by mouth daily. [provider] Taking Active Self  ? ?  ?  ? ?  ? ? ?There are no problems to display for this patient. ? ? ?Conditions to be addressed/monitored per PCP order:   Ulcerative Colitis ? ?Care Plan : RN Care Manager Plan of Care  ?Updates made by Heidi Dach, RN since 12/07/2021 12:00 AM  ?  ? ?Problem: Health Management needs related to Ulcerative Colitis   ?  ? ?Long-Range Goal: Development of Plan of Care to address Health Management needs related to Ulcerative Colitis   ?Start Date: 12/07/2021  ?Expected End Date: 02/05/2022  ?Priority: High  ?Note:   ?Current Barriers:  ?Chronic Disease Management support and education needs related to Ulcerative Colitis  Lacey Perez is managing Ulcerative Colitis with medication therapy. 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 The symptoms of UC cause her stress as it disrupts her daily life. She does not have a PCP to manage her overall health. ? ?RNCM Clinical Goal(s):  ?Patient will verbalize understanding of plan for management of Ulcerative Colitis as evidenced by patient reports ?take all medications exactly as prescribed and will call provider for medication related questions as evidenced by patient reports and documentation in EMR    ?attend all scheduled medical appointments: 12/13/21 with LCSW and 01/31/22 with GI as evidenced by provider documentation in EMR        ?work with Child psychotherapist to address Mental Health Concerns  related to the management of Ulcerative Colitis as evidenced by review  of EMR and patient or social worker report     through collaboration with Medical illustrator, provider, and care team.  ? ?Interventions: ?Inter-disciplinary care team collaboration (see longitudinal plan of care) ?Evaluation of current treatment plan related to  self management and patient's adherence to plan as established by provider ? ? ?Ulcerative Colitis  (Status: New goal.) Long Term Goal  ?Evaluation of current treatment plan related to  Ulcerative Colitis ,  self-management and patient's adherence to plan as established by provider. ?Discussed plans with patient for ongoing care management follow up and provided patient with direct contact information for care management team ?Advised patient to establish care with PCP, call Provider line [phone] for providers accepting new patients; ?Provided education to patient re: Ulcerative Colitis; ?Reviewed medications with patient and discussed following up on PA for Humira; ?Reviewed scheduled/upcoming provider appointments including GI on 01/31/22; ?Assessed social determinant of health barriers;  ?Referral to LCSW for managing stress ? ?Patient Goals/Self-Care Activities: ?Take medications as prescribed   ?  Attend all scheduled provider appointments ?Call provider office for new concerns or questions  ?Work with LCSW for managing stress ? ? ?  ? ? ?Follow Up:  Patient agrees to Care Plan and Follow-up. ? ?Plan: The Managed Medicaid care management team will reach out to the patient again over the next 60 days. ? ?Date/time of next scheduled RN care management/care coordination outreach:  02/02/22 @ 10:30am ? ?Estanislado EmmsMelanie Malania Gawthrop RN, BSN ?[place]  [facility] ?RN Care Coordinator ? ?

## 2021-12-07 NOTE — Patient Instructions (Signed)
Visit Information ? ?Lacey Perez was given information about Medicaid Managed Care team care coordination services as a part of their Healthy [facility] Medicaid benefit. Lacey Perez verbally consented to engagement with the [facility] Managed Care team.  ? ?If you are experiencing a medical emergency, please call 911 or report to your local emergency department or urgent care.  ? ?If you have a non-emergency medical problem during routine business hours, please contact your provider's office and ask to speak with a nurse.  ? ?For questions related to your Healthy [facility] health plan, please call: [phone] or visit the homepage here: [email or web address] ? ?If you would like to schedule transportation through your Healthy [facility] plan, please call the following number at least 2 days in advance of your appointment: [phone] ? For information about your ride after you set it up, call Ride Assist at [phone]. Use this number to activate a Will Call pickup, or if your transportation is late for a scheduled pickup. Use this number, too, if you need to make a change or cancel a previously scheduled reservation. ? If you need transportation services right away, call [phone]. The after-hours call center is staffed 24 hours to handle ride assistance and urgent reservation requests (including discharges) 365 days a year. Urgent trips include sick visits, hospital discharge requests and life-sustaining treatment. ? ?Call the [facility] Line at [phone], at any time, 24 hours a day, 7 days a week. If you are in danger or need immediate medical attention call 911. ? ?If you would like help to quit smoking, call 1-800-QUIT-NOW ((479)027-8499) OR Espa?ol: 1-855-D?jelo-Ya 281-869-9387) o para m?s informaci?n haga clic aqu? or Text READY to 200-400 to register via text ? ?Lacey Perez, ? ? ?Please see education materials related to Ulcerative  Colitis provided by MyChart link. ? ?Patient verbalizes understanding of instructions and care plan provided today and agrees to view in MyChart. Active MyChart status and patient understanding of how to access instructions and care plan via MyChart confirmed with patient.    ? ?Telephone follow up appointment with Managed Medicaid care management team member scheduled for:02/02/22 @ 10:30am ? ?Estanislado Emms RN, BSN ?  [facility] ?RN Care Coordinator ? ? ?Following is a copy of your plan of care:  ?Care Plan : RN Care Manager Plan of Care  ?Updates made by Heidi Dach, RN since 12/07/2021 12:00 AM  ?  ? ?Problem: Health Management needs related to Ulcerative Colitis   ?  ? ?Long-Range Goal: Development of Plan of Care to address Health Management needs related to Ulcerative Colitis   ?Start Date: 12/07/2021  ?Expected End Date: 02/05/2022  ?Priority: High  ?Note:   ?Current Barriers:  ?Chronic Disease Management support and education needs related to Ulcerative Colitis  Lacey Perez is managing Ulcerative Colitis with medication therapy. 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 The symptoms of UC cause her stress as it disrupts her daily life. She does not have a PCP to manage her overall health. ? ?RNCM Clinical Goal(s):  ?Patient will verbalize understanding of plan for management of Ulcerative Colitis as evidenced by patient reports ?take all medications exactly as prescribed and will call provider for medication related questions as evidenced by patient reports and documentation in EMR    ?attend all scheduled medical appointments: 12/13/21 with LCSW and 01/31/22 with GI as evidenced by provider documentation in EMR        ?work with Child psychotherapist  to address Mental Health Concerns  related to the management of Ulcerative Colitis as evidenced by review of EMR and patient or social worker report     through collaboration with Medical illustrator, provider, and  care team.  ? ?Interventions: ?Inter-disciplinary care team collaboration (see longitudinal plan of care) ?Evaluation of current treatment plan related to  self management and patient's adherence to plan as established by provider ? ? ?Ulcerative Colitis  (Status: New goal.) Long Term Goal  ?Evaluation of current treatment plan related to  Ulcerative Colitis ,  self-management and patient's adherence to plan as established by provider. ?Discussed plans with patient for ongoing care management follow up and provided patient with direct contact information for care management team ?Advised patient to establish care with PCP, call Provider line [phone] for providers accepting new patients; ?Provided education to patient re: Ulcerative Colitis; ?Reviewed medications with patient and discussed following up on PA for Humira; ?Reviewed scheduled/upcoming provider appointments including GI on 01/31/22; ?Assessed social determinant of health barriers;  ?Referral to LCSW for managing stress ? ?Patient Goals/Self-Care Activities: ?Take medications as prescribed   ?Attend all scheduled provider appointments ?Call provider office for new concerns or questions  ?Work with LCSW for managing stress ? ? ?  ? ?  ?

## 2021-12-13 ENCOUNTER — Other Ambulatory Visit: Payer: Self-pay | Admitting: Licensed Clinical Social Worker

## 2021-12-13 NOTE — Patient Outreach (Signed)
[facility]) Care Management  12/13/2021  Lacey Perez [birth year] [id number]  LCSW completed [facility] outreach attempt today during scheduled appointment time but was unable to reach patient successfully. A HIPPA compliant voice message was left encouraging patient to return call once available. LCSW will ask Scheduling Care Guide to reschedule [facility] SW appointment with patient as well.  Dickie La, BSW, MSW, [employer] Managed Medicaid LCSW [facility]  [facility] [name] Phone: [phone]

## 2021-12-13 NOTE — Patient Instructions (Addendum)
Susanne Borders ,   The [facility] Managed Care Team is available to provide assistance to you with your healthcare needs at no cost and as a benefit of your [facility] Health plan. I'm sorry I was unable to reach you today for our scheduled appointment. Our care guide will call you to reschedule our telephone appointment. Please call me at the number below. I am available to be of assistance to you regarding your healthcare needs. .   Thank you,   Eula Fried, BSW, MSW, LCSW Managed Medicaid LCSW [email or web address] Phone: [phone]  Visit Information  Ms. Soule was given information about Medicaid Managed Care team care coordination services as a part of their Healthy [facility] Medicaid benefit. Krystina Mikus verbally consented to engagement with the [facility] Managed Care team.   If you are experiencing a medical emergency, please call 911 or report to your local emergency department or urgent care.   If you have a non-emergency medical problem during routine business hours, please contact your provider's office and ask to speak with a nurse.   For questions related to your Healthy [facility] health plan, please call: [phone] or visit the homepage here: [email or web address]  If you would like to schedule transportation through your Healthy [facility] plan, please call the following number at least 2 days in advance of your appointment: [phone]  For information about your ride after you set it up, call Ride Assist at [phone]. Use this number to activate a Will Call pickup, or if your transportation is late for a scheduled pickup. Use this number, too, if you need to make a change or cancel a previously scheduled reservation.  If you need transportation services right away, call [phone]. The after-hours call center is staffed 24 hours to handle ride assistance and urgent reservation  requests (including discharges) 365 days a year. Urgent trips include sick visits, hospital discharge requests and life-sustaining treatment.  Call the [place] at [phone], at any time, 24 hours a day, 7 days a week. If you are in danger or need immediate medical attention call 911.  If you would like help to quit smoking, call 1-800-QUIT-NOW 412 833 1354) OR Espaol: 1-855-Djelo-Ya (8-921-194-1740) o para ms informacin haga clic aqu or Text READY to 200-400 to register via text  Following is a copy of your plan of care:  Care Plan : LCSW Plan of Care  Updates made by Greg Cutter, LCSW since 12/13/2021 12:00 AM     Problem: Stress concerns      Long-Range Goal: Stress Symptoms Identified   Start Date: 12/13/2021  Priority: High  Note:   Priority: High  Timeframe:  Long-Range Goal Priority:  High Start Date:   12/13/21               Expected End Date:  ongoing                     Follow Up Date--10/27/21  - consider counseling or psychiatry, read email explaining the difference between the two -consider bumping up your self-care  -consider creating a stronger support network  Why is this important?             Beating depression/stress may take some time.            If you don't feel better right away, don't give up on your treatment plan.    Current barriers:   Chronic Mental Health needs related to stress.  Patient requires Support, Education, Resources, Referrals, Advocacy, and Care Coordination, in order to meet Unmet Mental Health Needs. Patient will implement clinical interventions discussed today to decrease symptoms of stress and increase knowledge and/or ability of: coping skills. Mental Health Concerns and Social Isolation Patient lacks knowledge of available community counseling agencies and resources.  Clinical Goal(s): verbalize understanding of plan for management of Stress and demonstrate a reduction in symptoms. Patient will  consider connecting with a provider for ongoing mental health treatment, increase coping skills, healthy habits, self-management skills, and stress reduction        Patient Goals/Self-Care Activities: Over the next 120 days Attend scheduled medical appointments Utilize healthy coping skills and supportive resources discussed Contact PCP with any questions or concerns Consider gaining counseling and psychiatry services Implement self-care into your daily routine Call your insurance provider for more information about your Enhanced Benefits  Check out counseling resources provided  Begin personal counseling with LCSW, to reduce and manage symptoms of Depression and Stress, until well-established with mental health provider Incorporate into daily practice - relaxation techniques, deep breathing exercises, and mindfulness meditation strategies. Talk about feelings with friends, family members, spiritual advisor, etc. Contact LCSW directly [phone]), if you have questions, need assistance, or if additional social work needs are identified between now and our next scheduled telephone outreach call. Call 988 for mental health hotline/crisis line if needed (24/7 available) Try techniques to reduce symptoms of anxiety/negative thinking (deep breathing, distraction, positive self talk, etc)  - develop a personal safety plan - develop a plan to deal with triggers like holidays, anniversaries - exercise at least 2 to 3 times per week - have a plan for how to handle bad days - journal feelings and what helps to feel better or worse - spend time or talk with others at least 2 to 3 times per week - watch for early signs of feeling worse - begin personal counseling - call and visit an old friend - check out volunteer opportunities - join a support group - laugh; watch a funny movie or comedian - learn and use visualization or guided imagery - perform a random act of kindness - practice relaxation  or meditation daily - start or continue a personal journal - practice positive thinking and self-talk -continue with compliance of taking medication  -identify current effective and ineffective coping strategies.  -implement positive self-talk in care to increase self-esteem, confidence and feelings of control.  -consider alternative and complementary therapy approaches such as meditation, mindfulness or yoga.  -journaling, prayer, worship services, meditation or pastoral counseling.  -increase participation in pleasurable group activities such as hobbies, singing, sports or volunteering).  -consider the use of meditative movement therapy such as tai chi, yoga or qigong.  -start a regular daily exercise program based on tolerance, ability and patient choice to support positive thinking and activity     Patient Goals: Initial goal  Eula Fried, [place], MSW, [employer] Managed Medicaid LCSW [email or web address] Phone: [phone]  I will call you again on 12/28/21 at 1:00 pm for your final decision regarding referral for therapy/psychiatry.

## 2021-12-13 NOTE — Patient Outreach (Addendum)
Medicaid Managed Care Social Work Note  12/13/2021 Name:  Lacey Perez MRN:  [id number] DOB:  09-11-2000  Lacey Perez is an 21 y.o. year old female who is a primary patient of Pcp, No.  The Medicaid Managed Care Coordination team was consulted for assistance with:  [place] and Resources  Ms. Moyers was given information about Medicaid Managed Care Coordination team services today. Ernie Hew Gorum Patient agreed to services and verbal consent obtained.  Engaged with patient  for by telephone forinitial visit in response to referral for case management and/or care coordination services.   Assessments/Interventions:  Review of past medical history, allergies, medications, health status, including review of consultants reports, laboratory and other test data, was performed as part of comprehensive evaluation and provision of chronic care management services.  SDOH: (Social Determinant of Health) assessments and interventions performed: SDOH Interventions    Flowsheet Row Most Recent Value  SDOH Interventions   Stress Interventions Offered [employer], Provide Counseling       Advanced Directives Status:  Not addressed in this encounter.  Care Plan                 No Known Allergies  Medications Reviewed Today     Reviewed by Melissa Montane, RN (Registered Nurse) on 12/07/21 at 64  Med List Status: <None>   Medication Order Taking? Sig Documenting Provider Last Dose Status Informant  acetaminophen (TYLENOL) 500 MG tablet [id number] Yes Take 2,000 mg by mouth every 6 (six) hours as needed for mild pain. [provider] Taking Active Self  Adalimumab (HUMIRA PEN) 40 MG/0.4ML PNKT [id number] No Inject 1 pen. into the skin every 14 (fourteen) days.  Patient not taking: Reported on 12/07/2021   Milus Banister, MD Not Taking Active            Med Note Thamas Jaegers, MELANIE A   Wed Dec 07, 2021  3:22 PM) Waiting on PA  Adalimumab [facility]  PEN-CD/UC/HS STARTER) 80 MG/0.8ML PNKT [id number] No Inject 2 pens (160 mg total) under the skin on day 1, inject 1 pen (80 mg total) under the skin on day 15  Patient not taking: Reported on 12/07/2021   Milus Banister, MD Not Taking Active            Med Note Thamas Jaegers, MELANIE A   Wed Dec 07, 2021  3:22 PM) Has not started, waiting on PA    Discontinued 10/11/19 1240 aspirin-acetaminophen-caffeine (EXCEDRIN MIGRAINE) 250-250-65 MG tablet [id number] Yes Take 2 tablets by mouth every 6 (six) hours as needed for headache. [provider] Taking Active Self  cyclobenzaprine (FLEXERIL) 10 MG tablet [id number] Yes Take 1 tablet (10 mg total) by mouth 2 (two) times daily as needed for muscle spasms. [name], PA-C Taking Active   dicyclomine (BENTYL) 20 MG tablet [id number] Yes Take 1 tablet (20 mg total) by mouth 3 (three) times daily as needed for spasms. Vladimir Crofts, PA-C Taking Active   fluticasone [facility]) 50 MCG/ACT nasal spray [id number] Yes Place 2 sprays into both nostrils daily.  Patient taking differently: Place 2 sprays into both nostrils daily as needed for allergies.   Melynda Ripple, MD Taking Active     Discontinued 10/11/19 1240 ibuprofen (ADVIL) 600 MG tablet [id number] Yes Take 1 tablet (600 mg total) by mouth every 6 (six) hours as needed. Melynda Ripple, MD Taking Active     Discontinued 06/13/20 1454   mesalamine (LIALDA) 1.2 g EC tablet [id number] Yes  Take 4 tablets (4.8 g total) by mouth daily with breakfast. Milus Banister, MD Taking Active   mesalamine (ROWASA) 4 g enema [id number] Yes Place 60 mLs (4 g total) rectally every other day. Discontinue after 1 week. Milus Banister, MD Taking Active     Discontinued 10/11/19 1240 omeprazole (PRILOSEC) 20 MG capsule [id number] Yes Take 1 capsule (20 mg total) by mouth daily before breakfast. 30 mins before food ideally Vladimir Crofts, PA-C Taking Active   predniSONE (DELTASONE) 10 MG tablet [id number] Yes Take 4  tablets (40 mg total) by mouth daily with breakfast for 14 days, THEN 3 tablets (30 mg total) daily with breakfast for 7 days, THEN 2 tablets (20 mg total) daily with breakfast for 7 days, THEN 1 tablet (10 mg total) daily with breakfast for 7 days, THEN 0.5 tablets (5 mg total) daily with breakfast for 8 days. Vladimir Crofts, PA-C Taking Active   Probiotic Product (PROBIOTIC PO) [id number] Yes Take 1 capsule by mouth daily. [provider] Taking Active Self            There are no problems to display for this patient.   Conditions to be addressed/monitored per PCP order:   Stress  Care Plan : LCSW Plan of Care  Updates made by Greg Cutter, LCSW since 12/13/2021 12:00 AM     Problem: Stress concerns      Long-Range Goal: Stress Symptoms Identified   Start Date: 12/13/2021  Priority: High  Note:   Priority: High  Timeframe:  Long-Range Goal Priority:  High Start Date:   12/13/21               Expected End Date:  ongoing                     Follow Up Date--10/27/21  - consider counseling or psychiatry, read email explaining the difference between the two -consider bumping up your self-care  -consider creating a stronger support network  Why is this important?             Beating depression/stress may take some time.            If you don't feel better right away, don't give up on your treatment plan.    Current barriers:   Chronic Mental Health needs related to stress. Patient requires Support, Education, Resources, Referrals, Advocacy, and Care Coordination, in order to meet Unmet Mental Health Needs. Patient will implement clinical interventions discussed today to decrease symptoms of stress and increase knowledge and/or ability of: coping skills. Mental Health Concerns and Social Isolation Patient lacks knowledge of available community counseling agencies and resources.  Clinical Goal(s): verbalize understanding of plan for management of Stress and  demonstrate a reduction in symptoms. Patient will consider connecting with a provider for ongoing mental health treatment, increase coping skills, healthy habits, self-management skills, and stress reduction        Clinical Interventions:  Assessed patient's previous and current treatment, coping skills, support system and barriers to care.  Verbalization of feelings encouraged, motivational interviewing employed Emotional support provided, positive coping strategies explored Self care/establishing healthy boundaries emphasized Patient reports that she does not know what counseling and psychiatry is. [facility] LCSW educated her on this. Email was sent to patient on 12/13/21 with the explanation of these services as well. Patient wants to consider these resources first before agreeing to a referral.   [facility] LCSW sent list of coping skills for stress  management in email as well for her to review and consider implementing.  LCSW provided education on relaxation techniques such as meditation, deep breathing, massage, grounding exercises or yoga that can activate the body's relaxation response and ease symptoms of stress. LCSW ask that when pt is struggling with difficult emotions and racing thoughts that they start this relaxation response process.  Patient will work on implementing appropriate self-care habits into their daily routine such as: staying positive, writing a gratitude list, drinking water, staying active around the house, taking their medications as prescribed, combating negative thoughts or emotions and staying connected with their family and friends. Positive reinforcement provided for this decision to work on this. Motivational Interviewing employed Depression screen reviewed  Mindfulness or Relaxation training provided Active listening / Reflection utilized  Emotional Support Provided Problem [place] strategies reviewed Provided psychoeducation for mental health needs  Provided  brief CBT  Reviewed mental health medications and discussed importance of compliance:  Quality of sleep assessed & Sleep Hygiene techniques promoted  Participation in counseling encouraged  Verbalization of feelings encouraged  Suicidal Ideation/Homicidal Ideation assessed: Patient denies SI/HI  Review resources, discussed options and provided patient information about  Mental Health Resource Education Inter-disciplinary care team collaboration (see longitudinal plan of care)  Patient Goals/Self-Care Activities: Over the next 120 days Attend scheduled medical appointments Utilize healthy coping skills and supportive resources discussed Contact PCP with any questions or concerns Consider gaining counseling and psychiatry services Implement self-care into your daily routine Call your insurance provider for more information about your Enhanced Benefits  Check out counseling resources provided  Begin personal counseling with LCSW, to reduce and manage symptoms of Depression and Stress, until well-established with mental health provider Incorporate into daily practice - relaxation techniques, deep breathing exercises, and mindfulness meditation strategies. Talk about feelings with friends, family members, spiritual advisor, etc. Contact LCSW directly [phone]), if you have questions, need assistance, or if additional social work needs are identified between now and our next scheduled telephone outreach call. Call 988 for mental health hotline/crisis line if needed (24/7 available) Try techniques to reduce symptoms of anxiety/negative thinking (deep breathing, distraction, positive self talk, etc)  - develop a personal safety plan - develop a plan to deal with triggers like holidays, anniversaries - exercise at least 2 to 3 times per week - have a plan for how to handle bad days - journal feelings and what helps to feel better or worse - spend time or talk with others at least 2 to 3  times per week - watch for early signs of feeling worse - begin personal counseling - call and visit an old friend - check out volunteer opportunities - join a support group - laugh; watch a funny movie or comedian - learn and use visualization or guided imagery - perform a random act of kindness - practice relaxation or meditation daily - start or continue a personal journal - practice positive thinking and self-talk -continue with compliance of taking medication  -identify current effective and ineffective coping strategies.  -implement positive self-talk in care to increase self-esteem, confidence and feelings of control.  -consider alternative and complementary therapy approaches such as meditation, mindfulness or yoga.  -journaling, prayer, worship services, meditation or pastoral counseling.  -increase participation in pleasurable group activities such as hobbies, singing, sports or volunteering).  -consider the use of meditative movement therapy such as tai chi, yoga or qigong.  -start a regular daily exercise program based on tolerance, ability and patient  choice to support positive thinking and activity     Patient Goals: Initial goal      Follow up:  Patient agrees to Care Plan and Follow-up.  Plan: The Managed Medicaid care management team will reach out to the patient again over the next 30 days.  Date/time of next scheduled Social Work care management/care coordination outreach:  12/28/21 at 1:00 pm  Eula Fried, BSW, MSW, [place] Medicaid LCSW [email or web address] Phone: [phone]

## 2021-12-21 ENCOUNTER — Telehealth: Payer: Self-pay | Admitting: Gastroenterology

## 2021-12-21 NOTE — Telephone Encounter (Signed)
Dr Christella Hartigan I want to confirm that there is no reason the pt can not proceed with Humira and pneumo vaccine.  Please advise

## 2021-12-21 NOTE — Telephone Encounter (Signed)
Good Afternoon,  Inbound call from patient stating her Humira arrived today . Patient is inquiring if it is ok to proceed with taking the humira or if she needs to wait until after she receives her pneumonia  shot. Please give patient a call back to advise.  Thank You.

## 2021-12-22 NOTE — Telephone Encounter (Signed)
The patient has been notified of this information and all questions answered.

## 2021-12-27 ENCOUNTER — Other Ambulatory Visit: Payer: Self-pay | Admitting: Licensed Clinical Social Worker

## 2021-12-27 ENCOUNTER — Encounter: Payer: Self-pay | Admitting: Gastroenterology

## 2021-12-27 DIAGNOSIS — F4311 Post-traumatic stress disorder, acute: Secondary | ICD-10-CM

## 2021-12-27 NOTE — Patient Outreach (Addendum)
Medicaid Managed Care Social Work Note  12/27/2021 Name:  Lacey Perez MRN:  [id number] DOB:  2001-07-04  Lacey Perez is an 21 y.o. year old female who is a primary patient of Pcp, No.  The Medicaid Managed Care Coordination team was consulted for assistance with:  Lacey Perez and Resources  Lacey Perez was given information about Medicaid Managed Care Coordination team services today. Lacey Perez Patient agreed to services and verbal consent obtained.  Engaged with patient  for by telephone forfollow up visit in response to referral for case management and/or care coordination services.   Assessments/Interventions:  Review of past medical history, allergies, medications, health status, including review of consultants reports, laboratory and other test data, was performed as part of comprehensive evaluation and provision of chronic care management services.  SDOH: (Social Determinant of Health) assessments and interventions performed: SDOH Interventions    Flowsheet Row Most Recent Value  SDOH Interventions   Stress Interventions Offered [employer], Provide Counseling       Advanced Directives Status:  See Care Plan for related entries.  Care Plan                 No Known Allergies  Medications Reviewed Today     Reviewed by Greg Cutter, LCSW (Social Worker) on 12/27/21 at 1516  Med List Status: <None>   Medication Order Taking? Sig Documenting Provider Last Dose Status Informant  acetaminophen (TYLENOL) 500 MG tablet [id number] No Take 2,000 mg by mouth every 6 (six) hours as needed for mild pain. [provider] Taking Active Self  Adalimumab (HUMIRA PEN) 40 MG/0.4ML PNKT [id number] No Inject 1 pen. into the skin every 14 (fourteen) days.  Patient not taking: Reported on 12/07/2021   Milus Banister, MD Not Taking Active            Med Note Lacey Perez, MELANIE A   Wed Dec 07, 2021  3:22 PM) Waiting on PA  Adalimumab [facility]  PEN-CD/UC/HS STARTER) 80 MG/0.8ML PNKT [id number] No Inject 2 pens (160 mg total) under the skin on day 1, inject 1 pen (80 mg total) under the skin on day 15  Patient not taking: Reported on 12/07/2021   Milus Banister, MD Not Taking Active            Med Note Lacey Perez, MELANIE A   Wed Dec 07, 2021  3:22 PM) Has not started, waiting on PA  Discontinued 10/11/19 1240 aspirin-acetaminophen-caffeine (EXCEDRIN MIGRAINE) 250-250-65 MG tablet [id number] No Take 2 tablets by mouth every 6 (six) hours as needed for headache. [provider] Taking Active Self  cyclobenzaprine (FLEXERIL) 10 MG tablet [id number] No Take 1 tablet (10 mg total) by mouth 2 (two) times daily as needed for muscle spasms. Lacey Gens, PA-C Taking Active   dicyclomine (BENTYL) 20 MG tablet [id number] No Take 1 tablet (20 mg total) by mouth 3 (three) times daily as needed for spasms. Lacey Crofts, PA-C Taking Active   fluticasone [facility]) 50 MCG/ACT nasal spray [id number] No Place 2 sprays into both nostrils daily.  Patient taking differently: Place 2 sprays into both nostrils daily as needed for allergies.   Lacey Ripple, MD Taking Active   Discontinued 10/11/19 1240 ibuprofen (ADVIL) 600 MG tablet [id number] No Take 1 tablet (600 mg total) by mouth every 6 (six) hours as needed. Lacey Ripple, MD Taking Active     Discontinued 06/13/20 1454   mesalamine (LIALDA) 1.2 g EC tablet [id number] No Take 4  tablets (4.8 g total) by mouth daily with breakfast. Milus Banister, MD Taking Active   mesalamine (ROWASA) 4 g enema [id number] No Place 60 mLs (4 g total) rectally every other day. Discontinue after 1 week. Milus Banister, MD Taking Expired 12/07/21 2359   Discontinued 10/11/19 1240 omeprazole (PRILOSEC) 20 MG capsule [id number] No Take 1 capsule (20 mg total) by mouth daily before breakfast. 30 mins before food ideally Lacey Crofts, PA-C Taking Expired 12/09/21 2359   Probiotic Product (PROBIOTIC PO) [id number]  No Take 1 capsule by mouth daily. [provider] Taking Active Self            There are no problems to display for this patient.   Conditions to be addressed/monitored per PCP order: Stress  Care Plan : LCSW Plan of Care  Updates made by Greg Cutter, LCSW since 12/27/2021 12:00 AM     Problem: Stress concerns      Long-Range Goal: Stress Symptoms Identified   Start Date: 12/13/2021  Priority: High  Note:   Priority: High  Timeframe:  Long-Range Goal Priority:  High Start Date:   12/13/21               Expected End Date:  ongoing     Follow Up Date--01/26/22  - consider counseling or psychiatry, read email explaining the difference between the two -consider bumping up your self-care  -consider creating a stronger support network  Why is this important?             Beating depression/stress may take some time.            If you don't feel better right away, don't give up on your treatment plan.    Current barriers:   Chronic Mental Health needs related to stress. Patient requires Support, Education, Resources, Referrals, Advocacy, and Care Coordination, in order to meet Unmet Mental Health Needs. Patient will implement clinical interventions discussed today to decrease symptoms of stress and increase knowledge and/or ability of: coping skills. Mental Health Concerns and Social Isolation Patient lacks knowledge of available community counseling agencies and resources.  Clinical Goal(s): verbalize understanding of plan for management of Stress and demonstrate a reduction in symptoms. Patient will consider connecting with a provider for ongoing mental health treatment, increase coping skills, healthy habits, self-management skills, and stress reduction        Clinical Interventions:  Assessed patient's previous and current treatment, coping skills, support system and barriers to care.  Verbalization of feelings encouraged, motivational interviewing  employed Emotional support provided, positive coping strategies explored Self care/establishing healthy boundaries emphasized Patient reports that she does not know what counseling and psychiatry is. [facility] LCSW educated her on this. Email was sent to patient on 12/13/21 with the explanation of these services as well. Patient wants to consider these resources first before agreeing to a referral. UPDATE 12/27/21- Patient is interested in gaining counseling only. Referral made to [facility]. New emails were sent to patient with information on [facility].  [facility] LCSW sent list of coping skills for stress management in email as well for her to review and consider implementing.  LCSW provided education on relaxation techniques such as meditation, deep breathing, massage, grounding exercises or yoga that can activate the body's relaxation response and ease symptoms of stress. LCSW ask that when pt is struggling with difficult emotions and racing thoughts that they start this relaxation response process.  Patient will work on implementing appropriate self-care habits into their daily  routine such as: staying positive, writing a gratitude list, drinking water, staying active around the house, taking their medications as prescribed, combating negative thoughts or emotions and staying connected with their family and friends. Positive reinforcement provided for this decision to work on this. Motivational Interviewing employed Depression screen reviewed  Mindfulness or Relaxation training provided Active listening / Reflection utilized  Emotional Support Provided Problem [place] strategies reviewed Provided psychoeducation for mental health needs  Provided brief CBT  Reviewed mental health medications and discussed importance of compliance:  Quality of sleep assessed & Sleep Hygiene techniques promoted  Participation in counseling encouraged  Verbalization of feelings encouraged  Suicidal Ideation/Homicidal  Ideation assessed: Patient denies SI/HI  Review resources, discussed options and provided patient information about  Mental Health Resource Education Inter-disciplinary care team collaboration (see longitudinal plan of care)  Patient Goals/Self-Care Activities: Over the next 120 days Attend scheduled medical appointments Utilize healthy coping skills and supportive resources discussed Contact PCP with any questions or concerns Consider gaining counseling and psychiatry services Implement self-care into your daily routine Call your insurance provider for more information about your Enhanced Benefits  Check out counseling resources provided  Begin personal counseling with LCSW, to reduce and manage symptoms of Depression and Stress, until well-established with mental health provider Incorporate into daily practice - relaxation techniques, deep breathing exercises, and mindfulness meditation strategies. Talk about feelings with friends, family members, spiritual advisor, etc. Contact LCSW directly [phone]), if you have questions, need assistance, or if additional social work needs are identified between now and our next scheduled telephone outreach call. Call 988 for mental health hotline/crisis line if needed (24/7 available) Try techniques to reduce symptoms of anxiety/negative thinking (deep breathing, distraction, positive self talk, etc)  - develop a personal safety plan - develop a plan to deal with triggers like holidays, anniversaries - exercise at least 2 to 3 times per week - have a plan for how to handle bad days - journal feelings and what helps to feel better or worse - spend time or talk with others at least 2 to 3 times per week - watch for early signs of feeling worse - begin personal counseling - call and visit an old friend - check out volunteer opportunities - join a support group - laugh; watch a funny movie or comedian - learn and use visualization or guided  imagery - perform a random act of kindness - practice relaxation or meditation daily - start or continue a personal journal - practice positive thinking and self-talk -continue with compliance of taking medication  -identify current effective and ineffective coping strategies.  -implement positive self-talk in care to increase self-esteem, confidence and feelings of control.  -consider alternative and complementary therapy approaches such as meditation, mindfulness or yoga.  -journaling, prayer, worship services, meditation or pastoral counseling.  -increase participation in pleasurable group activities such as hobbies, singing, sports or volunteering).  -consider the use of meditative movement therapy such as tai chi, yoga or qigong.  -start a regular daily exercise program based on tolerance, ability and patient choice to support positive thinking and activity     Patient Goals: Follow up goal      Follow up:  Patient agrees to Care Plan and Follow-up.  Plan: The Managed Medicaid care management team will reach out to the patient again over the next 30 days.  Date/time of next scheduled Social Work care management/care coordination outreach:  01/26/22 at 1:00 pm.  Eula Fried, BSW, MSW, LCSW  Managed Medicaid [email or web address] Phone: [phone]

## 2021-12-27 NOTE — Patient Instructions (Signed)
Visit Information  Ms. Sou was given information about Medicaid Managed Care team care coordination services as a part of their Healthy [facility] Medicaid benefit. Britteney Stoklosa verbally consented to engagement with the [facility] Managed Care team.   If you are experiencing a medical emergency, please call 911 or report to your local emergency department or urgent care.   If you have a non-emergency medical problem during routine business hours, please contact your provider's office and ask to speak with a nurse.   For questions related to your Healthy [facility] health plan, please call: [phone] or visit the homepage here: [email or web address]  If you would like to schedule transportation through your Healthy [facility] plan, please call the following number at least 2 days in advance of your appointment: [phone]  For information about your ride after you set it up, call Ride Assist at [phone]. Use this number to activate a Will Call pickup, or if your transportation is late for a scheduled pickup. Use this number, too, if you need to make a change or cancel a previously scheduled reservation.  If you need transportation services right away, call [phone]. The after-hours call center is staffed 24 hours to handle ride assistance and urgent reservation requests (including discharges) 365 days a year. Urgent trips include sick visits, hospital discharge requests and life-sustaining treatment.  Call the [employer] at [phone], at any time, 24 hours a day, 7 days a week. If you are in danger or need immediate medical attention call 911.  If you would like help to quit smoking, call 1-800-QUIT-NOW 561-150-7162) OR Espaol: 1-855-Djelo-Ya (9-628-366-2947) o para ms informacin haga clic aqu or Text READY to 200-400 to register via text  Following is a copy of your plan of care:  Care Plan : LCSW Plan of Care   Updates made by Greg Cutter, LCSW since 12/27/2021 12:00 AM     Problem: Stress concerns      Long-Range Goal: Stress Symptoms Identified   Start Date: 12/13/2021  Priority: High  Note:   Priority: High  Timeframe:  Long-Range Goal Priority:  High Start Date:   12/13/21               Expected End Date:  ongoing     Follow Up Date--01/26/22  - consider counseling or psychiatry, read email explaining the difference between the two -consider bumping up your self-care  -consider creating a stronger support network  Why is this important?             Beating depression/stress may take some time.            If you don't feel better right away, don't give up on your treatment plan.    Current barriers:   Chronic Mental Health needs related to stress. Patient requires Support, Education, Resources, Referrals, Advocacy, and Care Coordination, in order to meet Unmet Mental Health Needs. Patient will implement clinical interventions discussed today to decrease symptoms of stress and increase knowledge and/or ability of: coping skills. Mental Health Concerns and Social Isolation Patient lacks knowledge of available community counseling agencies and resources.  Clinical Goal(s): verbalize understanding of plan for management of Stress and demonstrate a reduction in symptoms. Patient will consider connecting with a provider for ongoing mental health treatment, increase coping skills, healthy habits, self-management skills, and stress reduction       Patient Goals/Self-Care Activities: Over the next 120 days Attend scheduled medical appointments Utilize healthy coping skills and supportive resources  discussed Contact PCP with any questions or concerns Consider gaining counseling and psychiatry services Implement self-care into your daily routine Call your insurance provider for more information about your Enhanced Benefits  Check out counseling resources provided  Begin personal counseling  with LCSW, to reduce and manage symptoms of Stress, until well-established with mental health provider Incorporate into daily practice - relaxation techniques, deep breathing exercises, and mindfulness meditation strategies. Talk about feelings with friends, family members, spiritual advisor, etc. Contact LCSW directly [phone]), if you have questions, need assistance, or if additional social work needs are identified between now and our next scheduled telephone outreach call. Call 988 for mental health hotline/crisis line if needed (24/7 available) Try techniques to reduce symptoms of anxiety/negative thinking (deep breathing, distraction, positive self talk, etc)  - develop a personal safety plan - develop a plan to deal with triggers like holidays, anniversaries - exercise at least 2 to 3 times per week - have a plan for how to handle bad days - journal feelings and what helps to feel better or worse - spend time or talk with others at least 2 to 3 times per week - watch for early signs of feeling worse - begin personal counseling - call and visit an old friend - check out volunteer opportunities - join a support group - laugh; watch a funny movie or comedian - learn and use visualization or guided imagery - perform a random act of kindness - practice relaxation or meditation daily - start or continue a personal journal - practice positive thinking and self-talk -continue with compliance of taking medication  -identify current effective and ineffective coping strategies.  -implement positive self-talk in care to increase self-esteem, confidence and feelings of control.  -consider alternative and complementary therapy approaches such as meditation, mindfulness or yoga.  -journaling, prayer, worship services, meditation or pastoral counseling.  -increase participation in pleasurable group activities such as hobbies, singing, sports or volunteering).  -consider the use of  meditative movement therapy such as tai chi, yoga or qigong.  -start a regular daily exercise program based on tolerance, ability and patient choice to support positive thinking and activity     Patient Goals: Follow up goal  Eula Fried, BSW, MSW, [employer] Managed Medicaid [email or web address] [email or web address] Phone: [phone]

## 2022-01-09 ENCOUNTER — Telehealth: Payer: Self-pay | Admitting: Gastroenterology

## 2022-01-09 NOTE — Telephone Encounter (Signed)
6/6 had Humira for the first time and began to have night sweats and developed hives on both legs with itching the day after Humira where she injected.  She took benadryl and this resolved.   SOB for 1 day about 2 days after the Humira.  The pt says it was not very bothersome however the humira nurse asked that the pt notify our office prior to next injection tomorrow. Ok to proceed?  She will wait to inject until hshe hears back from our office.

## 2022-01-09 NOTE — Telephone Encounter (Signed)
Inbound call from patient stating she has questions regarding Humira. Please advise.

## 2022-01-10 NOTE — Telephone Encounter (Signed)
The pt has been advised and agrees to the pre med.  She will call with any further concerns

## 2022-01-16 DIAGNOSIS — Z124 Encounter for screening for malignant neoplasm of cervix: Secondary | ICD-10-CM | POA: Diagnosis not present

## 2022-01-16 DIAGNOSIS — Z0000 Encounter for general adult medical examination without abnormal findings: Secondary | ICD-10-CM | POA: Diagnosis not present

## 2022-01-19 ENCOUNTER — Telehealth: Payer: Self-pay | Admitting: Gastroenterology

## 2022-01-19 NOTE — Telephone Encounter (Signed)
Inbound call from patients mom inquiring if patient can be prescribed pain medication due to her having Crohns disease. Patient is experiencing abdominal pain and cramping, weight loss and also diarrhea, patient has not been able to eat as well. Please give patients mom a call back to advise [phone] .

## 2022-01-20 ENCOUNTER — Encounter: Payer: Self-pay | Admitting: Gastroenterology

## 2022-01-20 MED ORDER — HYDROCODONE-ACETAMINOPHEN 5-325 MG PO TABS: 1.0000 | ORAL_TABLET | Freq: Four times a day (QID) | ORAL | 0 refills | Status: DC | PRN

## 2022-01-20 NOTE — Telephone Encounter (Signed)
Attempted to contact patient's mother and she did not answer. Will try again later.

## 2022-01-20 NOTE — Telephone Encounter (Signed)
Patient states [facility] pharmacy did not have it in stock. It is Sport and exercise psychologist. Patient requests we try to send it to [facility] on [address] Medication is on hold for you to send Dr. Christella Hartigan.

## 2022-01-23 ENCOUNTER — Other Ambulatory Visit: Payer: Self-pay | Admitting: Gastroenterology

## 2022-01-23 DIAGNOSIS — K51511 Left sided colitis with rectal bleeding: Secondary | ICD-10-CM

## 2022-01-23 MED ORDER — HYDROCODONE-ACETAMINOPHEN 5-325 MG PO TABS: 1.0000 | ORAL_TABLET | Freq: Four times a day (QID) | ORAL | 0 refills | Status: DC | PRN

## 2022-01-23 NOTE — Telephone Encounter (Signed)
Called patient with no answer and no voicemail to leave a message. 

## 2022-01-23 NOTE — Telephone Encounter (Signed)
See my chart message

## 2022-01-25 ENCOUNTER — Other Ambulatory Visit: Payer: Self-pay

## 2022-01-25 ENCOUNTER — Encounter: Payer: Self-pay | Admitting: Gastroenterology

## 2022-01-25 MED ORDER — HYDROCODONE-ACETAMINOPHEN 5-325 MG PO TABS
1.0000 | ORAL_TABLET | Freq: Four times a day (QID) | ORAL | 0 refills | Status: DC | PRN
Start: 2022-01-25 — End: 2022-01-25

## 2022-01-25 MED ORDER — HYDROCODONE-ACETAMINOPHEN 5-325 MG PO TABS: 1.0000 | ORAL_TABLET | Freq: Four times a day (QID) | ORAL | 0 refills | Status: DC | PRN

## 2022-01-25 NOTE — Addendum Note (Signed)
Addended by: [name] on: 01/25/2022 03:23 PM   Modules accepted: Orders

## 2022-01-25 NOTE — Addendum Note (Signed)
Addended by: Lamona Curl on: 01/25/2022 03:20 PM   Modules accepted: Orders

## 2022-01-26 ENCOUNTER — Other Ambulatory Visit: Payer: Self-pay

## 2022-01-26 NOTE — Patient Outreach (Signed)
[facility]) Care Management  01/26/2022  Lacey Perez February 11, 2001 [id number]  LCSW completed [facility] outreach attempt today during scheduled appointment time but was unable to reach patient successfully. A HIPPA compliant voice message was left encouraging patient to return call once available. LCSW will ask Scheduling Care Guide to reschedule [facility] SW appointment with patient as well.  Dickie La, BSW, MSW, [employer] Managed Medicaid LCSW [facility]  [facility] [email or web address] Phone: [phone]

## 2022-01-26 NOTE — Patient Instructions (Signed)
Lacey Perez ,   The Medicaid Managed Care Team is available to provide assistance to you with your healthcare needs at no cost and as a benefit of your Medicaid Health plan. I'm sorry I was unable to reach you today for our scheduled appointment. Our care guide will call you to reschedule our telephone appointment. Please call me at the number below. I am available to be of assistance to you regarding your healthcare needs. .   Thank you,   Clinton Wahlberg, BSW, MSW, LCSW Managed Medicaid LCSW [place]  [facility] [email or web address] Phone: [phone]   

## 2022-01-27 ENCOUNTER — Telehealth: Payer: Self-pay

## 2022-01-27 ENCOUNTER — Other Ambulatory Visit (HOSPITAL_COMMUNITY): Payer: Self-pay

## 2022-01-27 NOTE — Telephone Encounter (Signed)
..   Medicaid Managed Care   Unsuccessful Outreach Note  01/27/2022 Name: Lacey Perez MRN: [id number] DOB: 04/12/2001  Referred by: Pcp, No Reason for referral : High Risk Managed Medicaid (I called the patient today to get her rescheduled with the MM LCSW. I left my name and number on her VM.)   A second unsuccessful telephone outreach was attempted today. The patient was referred to the case management team for assistance with care management and care coordination.   Follow Up Plan: The care management team will reach out to the patient again over the next 7-14 days.      Weston Settle Care Guide, High Risk Medicaid Managed Care Embedded Care Coordination [facility]  [facility]

## 2022-01-31 ENCOUNTER — Other Ambulatory Visit: Payer: Medicaid Other

## 2022-01-31 ENCOUNTER — Ambulatory Visit: Payer: Medicaid Other | Admitting: Gastroenterology

## 2022-01-31 ENCOUNTER — Encounter: Payer: Self-pay | Admitting: Gastroenterology

## 2022-01-31 DIAGNOSIS — A048 Other specified bacterial intestinal infections: Secondary | ICD-10-CM | POA: Diagnosis not present

## 2022-01-31 DIAGNOSIS — K51511 Left sided colitis with rectal bleeding: Secondary | ICD-10-CM | POA: Diagnosis not present

## 2022-01-31 DIAGNOSIS — A498 Other bacterial infections of unspecified site: Secondary | ICD-10-CM

## 2022-01-31 DIAGNOSIS — K649 Unspecified hemorrhoids: Secondary | ICD-10-CM

## 2022-01-31 NOTE — Addendum Note (Signed)
Addended by: Lamona Curl on: 01/31/2022 09:50 AM   Modules accepted: Orders

## 2022-01-31 NOTE — Patient Instructions (Addendum)
If you are age 23 or older, your body mass index should be between 23-30. Your Body mass index is 21.81 kg/m. If this is out of the aforementioned range listed, please consider follow up with your Primary Care Provider.  If you are age 59 or younger, your body mass index should be between 19-25. Your Body mass index is 21.81 kg/m. If this is out of the aformentioned range listed, please consider follow up with your Primary Care Provider.   ________________________________________________________  The [place] [facility] providers would like to encourage you to use [facility] to communicate with providers for non-urgent requests or questions.  Due to long hold times on the telephone, sending your provider a message by [facility] may be a faster and more efficient way to get a response.  Please allow 48 business hours for a response.  Please remember that this is for non-urgent requests.  _______________________________________________________  Your provider has requested that you go to the basement level for lab work before leaving today. Press "B" on the elevator. The lab is located at the first door on the left as you exit the elevator.  You will need to return for additional lab work on Monday 02-06-22.  Due to recent changes in healthcare laws, you may see the results of your imaging and laboratory studies on MyChart before your provider has had a chance to review them.  We understand that in some cases there may be results that are confusing or concerning to you. Not all laboratory results come back in the same time frame and the provider may be waiting for multiple results in order to interpret others.  Please give [place] 48 hours in order for your provider to thoroughly review all the results before contacting the office for clarification of your results.   RESTART: Lialda 4 pills daily.  Please purchase the following medications over the counter and take as directed:  START: Preparation H apply small amount  twice daily.  You should be using Carnation breakfast meals two daily.  You are scheduled to follow up on 03-21-22 at 9:50am.  Thank you for entrusting me with your care and choosing [facility].  Dr Christella Hartigan

## 2022-01-31 NOTE — Progress Notes (Signed)
Review of pertinent gastrointestinal problems: 1.  Indeterminate colitis (left-sided segmental).  Diarrhea, minor bleeding led to colonoscopy 06/2021 terminal ileum not intubated due to tortuosity, no evidence of strictures or inflammation.  Right colon was normal, segmental inflammation in the left colon including distal rectum and also for a 20 cm segment in the sigmoid.  Multiple biopsies taken from throughout and path showed chronic inflammation in the left sided segmental changes.  Lialda 4 pills once daily started, also Rowasa enema once daily started.  CT enteroscopy 10/2021 "severe circumferential wall thickening and hyperenhancement involving the descending colon through the rectum." Labs 10/2021: Hepatitis B surface antigen negative, Quantifuron gold TB testing negative, hepatitis B surface Humira new start 12/2021:  2.  Acute norovirus infection 10/2021    HPI: This is a very pleasant 21 year old woman whom I last saw about 2 months ago.  Since then she has started Humira.  She had her 2 shot induction on June 6 and she has had 2 shots since then.  Her next Humira 40 mg dose is in 1 week from today.  The bleeding has definitely improved.  She only sees blood every few days now.  She is however still having multiple urgent loose watery stools every single day and abdominal pains.  She has early satiety.  She has a poor appetite with anorexia.  She actually stopped taking her Lialda 4 pills once daily a couple days before she started Humira.  She has not been on prednisone for a long time.  She has lost about 10 pounds since her last office visit here 2 months ago.   ROS: complete GI ROS as described in HPI, all other review negative.  Constitutional:  No unintentional weight loss   Past Medical History:  Diagnosis Date   Asthma     Past Surgical History:  Procedure Laterality Date   TONSILLECTOMY AND ADENOIDECTOMY     WISDOM TOOTH EXTRACTION      Current Outpatient Medications   Medication Instructions   acetaminophen (TYLENOL) 2,000 mg, Oral, Every 6 hours PRN   Adalimumab (HUMIRA PEN) 40 MG/0.4ML PNKT 1 pen , Subcutaneous, Every 14 days   Adalimumab (HUMIRA PEN-CD/UC/HS STARTER) 80 MG/0.8ML PNKT Inject 2 pens (160 mg total) under the skin on day 1, inject 1 pen (80 mg total) under the skin on day 15   aspirin-acetaminophen-caffeine (EXCEDRIN MIGRAINE) 250-250-65 MG tablet 2 tablets, Oral, Every 6 hours PRN   cyclobenzaprine (FLEXERIL) 10 mg, Oral, 2 times daily PRN   dicyclomine (BENTYL) 20 mg, Oral, 3 times daily PRN   fluticasone (FLONASE) 50 MCG/ACT nasal spray 2 sprays, Each Nare, Daily   HYDROcodone-acetaminophen (NORCO/VICODIN) 5-325 MG tablet 1 tablet, Oral, Every 6 hours PRN   ibuprofen (ADVIL) 600 mg, Oral, Every 6 hours PRN   mesalamine (LIALDA) 4.8 g, Oral, Daily with breakfast   mesalamine (ROWASA) 4 g, Rectal, Every other day, Discontinue after 1 week.   omeprazole (PRILOSEC) 20 mg, Oral, Daily before breakfast, 30 mins before food ideally   Probiotic Product (PROBIOTIC PO) 1 capsule, Oral, Daily    Allergies as of 01/31/2022   (No Known Allergies)    Family History  Problem Relation Age of Onset   Hypertension Mother    Asthma Mother    Fibroids Mother    Asthma Father    Colon polyps Maternal Grandmother    Breast cancer Paternal Grandmother    Diabetes Paternal Grandmother    Colon cancer Neg Hx    Esophageal cancer  Neg Hx    Rectal cancer Neg Hx    Stomach cancer Neg Hx     Social History   Socioeconomic History   Marital status: Single    Spouse name: Not on file   Number of children: 0   Years of education: Not on file   Highest education level: Not on file  Occupational History   Not on file  Tobacco Use   Smoking status: Never   Smokeless tobacco: Never  Vaping Use   Vaping Use: Every day   Substances: Nicotine, Flavoring  Substance and Sexual Activity   Alcohol use: Yes    Comment: rarely   Drug use: Never    Sexual activity: Not Currently    Birth control/protection: None  Other Topics Concern   Not on file  Social History Narrative   Not on file   Social Determinants of Health   Financial Resource Strain: Not on file  Food Insecurity: No Food Insecurity (12/07/2021)   Hunger Vital Sign    Worried About Running Out of Food in the Last Year: Never true    Ran Out of Food in the Last Year: Never true  Transportation Needs: No Transportation Needs (12/07/2021)   PRAPARE - Administrator, Civil Service (Medical): No    Lack of Transportation (Non-Medical): No  Physical Activity: Not on file  Stress: Stress Concern Present (12/27/2021)   [employer] of Occupational Health - Occupational Stress Questionnaire    Feeling of Stress : To some extent  Social Connections: Not on file  Intimate Partner Violence: Not on file     Physical Exam: Ht 5\' 6"  (1.676 m)   Wt 135 lb 2 oz (61.3 kg)   BMI 21.81 kg/m  Constitutional: generally well-appearing Psychiatric: alert and oriented x3 Abdomen: soft, nontender, nondistended, no obvious ascites, no peritoneal signs, normal bowel sounds No peripheral edema noted in lower extremities Rectal examination with a female assistant in the room revealed a small slightly swollen left-sided internal hemorrhoid.  No fissures.  Assessment and plan: 21 y.o. female with indeterminate colitis, likely ulcerative colitis  She is not responding very well to Humira new start but she does have some clinical improvement.  Her next dose is 1 week from today.  She will have trough antibodies and drug level checked the day prior to her trough.  I am having her restart her Lialda at 4 pills once daily.  She stopped this a day or 2 prior to her Humira.  I am also checking TPMT enzyme activity and will likely start her on azathioprine at usual weight-based dosing.  She will get GI pathogen panel as well today.  She is not eating well and has lost weight and I  recommended Carnation Instant Breakfast 2 of these every day.  She will return to see me in 6 to 8 weeks and sooner if needed.  She has had some anal pains and I examined her backside this morning with female assistant in the room and she does have a small slightly swollen hemorrhoid.  No fissures.  I asked her to put Preparation H on this twice daily.  Please see the "Patient Instructions" section for addition details about the plan.  19, MD  Gastroenterology 01/31/2022, 9:06 AM   Total time on date of encounter was 35 minutes (this included time spent preparing to see the patient reviewing records; obtaining and/or reviewing separately obtained history; performing a medically appropriate exam and/or evaluation; counseling and  educating the patient and family if present; ordering medications, tests or procedures if applicable; and documenting clinical information in the health record).

## 2022-02-01 ENCOUNTER — Telehealth (HOSPITAL_COMMUNITY): Payer: Self-pay

## 2022-02-01 NOTE — Telephone Encounter (Signed)
Pt lvm to get estblished clld pt bck no ans lvm for pt to call us bck Mindi Junker

## 2022-02-02 ENCOUNTER — Other Ambulatory Visit: Payer: Self-pay | Admitting: *Deleted

## 2022-02-02 LAB — GI PROFILE, STOOL, PCR

## 2022-02-02 NOTE — Patient Instructions (Signed)
Visit Information  Ms. Fricke was given information about Medicaid Managed Care team care coordination services as a part of their Healthy [facility] Medicaid benefit. Tigerlily Narine verbally consented to engagement with the [facility] Managed Care team.   If you are experiencing a medical emergency, please call 911 or report to your local emergency department or urgent care.   If you have a non-emergency medical problem during routine business hours, please contact your provider's office and ask to speak with a nurse.   For questions related to your Healthy [facility] health plan, please call: [phone] or visit the homepage here: [email or web address]  If you would like to schedule transportation through your Healthy [facility] plan, please call the following number at least 2 days in advance of your appointment: [phone]  For information about your ride after you set it up, call Ride Assist at [phone]. Use this number to activate a Will Call pickup, or if your transportation is late for a scheduled pickup. Use this number, too, if you need to make a change or cancel a previously scheduled reservation.  If you need transportation services right away, call [phone]. The after-hours call center is staffed 24 hours to handle ride assistance and urgent reservation requests (including discharges) 365 days a year. Urgent trips include sick visits, hospital discharge requests and life-sustaining treatment.  Call the [facility] Line at [phone], at any time, 24 hours a day, 7 days a week. If you are in danger or need immediate medical attention call 911.  If you would like help to quit smoking, call 1-800-QUIT-NOW (304-817-6624) OR Espaol: 1-855-Djelo-Ya (3-785-885-0277) o para ms informacin haga clic aqu or Text READY to 412-878 to register via text  Ms. Degeorge,   Please see education materials related to diet and  ulcerative colitis provided by MyChart link.  Patient verbalizes understanding of instructions and care plan provided today and agrees to view in MyChart. Active MyChart status and patient understanding of how to access instructions and care plan via MyChart confirmed with patient.     Telephone follow up appointment with Managed Medicaid care management team member scheduled for:04/03/22 @ 3:30pm  Estanislado Emms RN, BSN [place]  [facility] RN Care Coordinator   Following is a copy of your plan of care:  Care Plan : RN Care Manager Plan of Care  Updates made by Heidi Dach, RN since 02/02/2022 12:00 AM     Problem: Health Management needs related to Ulcerative Colitis      Long-Range Goal: Development of Plan of Care to address Health Management needs related to Ulcerative Colitis   Start Date: 12/07/2021  Expected End Date: 04/03/2022  Priority: High  Note:   Current Barriers:  Chronic Disease Management support and education needs related to Ulcerative Colitis  Ms. Daughenbaugh started Humira injections, but continues to have abdominal pain, frequent stools, decreased appetite and weight loss. She had a visit with GI this week and follow up scheduled in August. She is requesting a referral to see a dietician to help with meal planning and food selection.  RNCM Clinical Goal(s):  Patient will verbalize understanding of plan for management of Ulcerative Colitis as evidenced by patient reports take all medications exactly as prescribed and will call provider for medication related questions as evidenced by patient reports and documentation in EMR    attend all scheduled medical appointments: 7/17 with LCSW, 8/18 with GYN and 8/29 with GI as evidenced by provider documentation in EMR  work with Child psychotherapist to address Mental Health Concerns  related to the management of Ulcerative Colitis as evidenced by review of EMR and patient or Child psychotherapist report     through  collaboration with Medical illustrator, provider, and care team.   Interventions: Inter-disciplinary care team collaboration (see longitudinal plan of care) Evaluation of current treatment plan related to  self management and patient's adherence to plan as established by provider   Ulcerative Colitis  (Status: Goal on Track (progressing): YES.) Long Term Goal  Evaluation of current treatment plan related to  Ulcerative Colitis ,  self-management and patient's adherence to plan as established by provider. Discussed plans with patient for ongoing care management follow up and provided patient with direct contact information for care management team Provided education to patient re: Ulcerative Colitis; Reviewed medications with patient and discussed avoiding ibuprofen or medications that contain ibuprofen; Collaborated with Dr. Christella Hartigan regarding request for dietician referral; Reviewed scheduled/upcoming provider appointments including LCSW on 7/17, GYN on 8/18 and GI on 8/29; Assessed social determinant of health barriers;  Rescheduled missed appointment with LCSW on 02/06/22 @ 3:45pm Reviewed Dr. Larae Grooms note, he recommended patient to drink 2 carnation instant breakfast shakes a day Provided therapeutic listening  Patient Goals/Self-Care Activities: Take medications as prescribed   Attend all scheduled provider appointments Call provider office for new concerns or questions  Work with LCSW for managing stress

## 2022-02-02 NOTE — Patient Outreach (Signed)
Medicaid Managed Care   Nurse Care Manager Note  02/02/2022 Name:  Lacey Perez MRN:  [id number] DOB:  2001/02/19  Lacey Perez is an 21 y.o. year old female who is a primary patient of Pcp, No.  The Medicaid Managed Care Coordination team was consulted for assistance with:    Ulcerative Colitis  Lacey Perez was given information about Medicaid Managed Care Coordination team services today. Lacey Perez Patient agreed to services and verbal consent obtained.  Engaged with patient by telephone for follow up visit in response to provider referral for case management and/or care coordination services.   Assessments/Interventions:  Review of past medical history, allergies, medications, health status, including review of consultants reports, laboratory and other test data, was performed as part of comprehensive evaluation and provision of chronic care management services.  SDOH (Social Determinants of Health) assessments and interventions performed:   Care Plan  No Known Allergies  Medications Reviewed Today     Reviewed by Lacey Dach, RN (Registered Nurse) on 02/02/22 at 1044  Med List Status: <None>   Medication Order Taking? Sig Documenting Provider Last Dose Status Informant  acetaminophen (TYLENOL) 500 MG tablet [id number] Yes Take 2,000 mg by mouth every 6 (six) hours as needed for mild pain. [provider] Taking Active Self  Adalimumab (HUMIRA PEN) 40 MG/0.4ML PNKT [id number] Yes Inject 1 pen. into the skin every 14 (fourteen) days. [name], MD Taking Active            Med Note (Jonothan Heberle A   Thu Feb 02, 2022 10:27 AM)    Adalimumab [facility] PEN-CD/UC/HS STARTER) 56 MG/0.8ML PNKT [id number] Yes Inject 2 pens (160 mg total) under the skin on day 1, inject 1 pen (80 mg total) under the skin on day 15 [name], MD Taking Active            Med Note [name] Feb 02, 2022 10:28 AM) Completed starter    Discontinued 10/11/19  1240 aspirin-acetaminophen-caffeine (EXCEDRIN MIGRAINE) 250-250-65 MG tablet [id number] No Take 2 tablets by mouth every 6 (six) hours as needed for headache.  Patient not taking: Reported on 02/02/2022   [provider] Not Taking Active Self  cyclobenzaprine (FLEXERIL) 10 MG tablet [id number] No Take 1 tablet (10 mg total) by mouth 2 (two) times daily as needed for muscle spasms.  Patient not taking: Reported on 01/31/2022   Lurene Shadow, PA-C Not Taking Active   dicyclomine (BENTYL) 20 MG tablet [id number] Yes Take 1 tablet (20 mg total) by mouth 3 (three) times daily as needed for spasms. [name], PA-C Taking Active   fluticasone [facility]) 50 MCG/ACT nasal spray [id number] Yes Place 2 sprays into both nostrils daily.  Patient taking differently: Place 2 sprays into both nostrils daily as needed for allergies.   [name], MD Taking Active     Discontinued 10/11/19 1240 HYDROcodone-acetaminophen (NORCO/VICODIN) 5-325 MG tablet [id number] Yes Take 1 tablet by mouth every 6 (six) hours as needed for moderate pain. [name], MD Taking Active   ibuprofen (ADVIL) 600 MG tablet [id number] No Take 1 tablet (600 mg total) by mouth every 6 (six) hours as needed.  Patient not taking: Reported on 02/02/2022   [name], MD Not Taking Active     Discontinued 06/13/20 1454   mesalamine (LIALDA) 1.2 g EC tablet [id number] Yes Take 4 tablets (4.8 g total) by mouth daily with breakfast. [name], MD Taking Active  mesalamine (ROWASA) 4 g enema [id number]  Place 60 mLs (4 g total) rectally every other day. Discontinue after 1 week.  Patient not taking: Reported on 01/31/2022   [name], MD  Expired 01/31/22 2359     Discontinued 10/11/19 1240 omeprazole (PRILOSEC) 20 MG capsule [id number]  Take 1 capsule (20 mg total) by mouth daily before breakfast. 30 mins before food ideally  Patient not taking: Reported on 01/31/2022   [name], PA-C  Expired 01/31/22  2359   Probiotic Product (PROBIOTIC PO) [id number] Yes Take 1 capsule by mouth daily. [provider] Taking Active Self            There are no problems to display for this patient.   Conditions to be addressed/monitored per PCP order:   Ulcerative Colitis  Care Plan : RN Care Manager Plan of Care  Updates made by Lacey Dach, RN since 02/02/2022 12:00 AM     Problem: Health Management needs related to Ulcerative Colitis      Long-Range Goal: Development of Plan of Care to address Health Management needs related to Ulcerative Colitis   Start Date: 12/07/2021  Expected End Date: 04/03/2022  Priority: High  Note:   Current Barriers:  Chronic Disease Management support and education needs related to Ulcerative Colitis  Lacey Perez started Humira injections, but continues to have abdominal pain, frequent stools, decreased appetite and weight loss. She had a visit with GI this week and follow up scheduled in August. She is requesting a referral to see a dietician to help with meal planning and food selection.  RNCM Clinical Goal(s):  Patient will verbalize understanding of plan for management of Ulcerative Colitis as evidenced by patient reports take all medications exactly as prescribed and will call provider for medication related questions as evidenced by patient reports and documentation in EMR    attend all scheduled medical appointments: 7/17 with LCSW, 8/18 with GYN and 8/29 with GI as evidenced by provider documentation in EMR        work with Child psychotherapist to address Mental Health Concerns  related to the management of Ulcerative Colitis as evidenced by review of EMR and patient or social worker report     through collaboration with Medical illustrator, provider, and care team.   Interventions: Inter-disciplinary care team collaboration (see longitudinal plan of care) Evaluation of current treatment plan related to  self management and patient's adherence to plan as  established by provider   Ulcerative Colitis  (Status: Goal on Track (progressing): YES.) Long Term Goal  Evaluation of current treatment plan related to  Ulcerative Colitis ,  self-management and patient's adherence to plan as established by provider. Discussed plans with patient for ongoing care management follow up and provided patient with direct contact information for care management team Provided education to patient re: Ulcerative Colitis; Reviewed medications with patient and discussed avoiding ibuprofen or medications that contain ibuprofen; Collaborated with Dr. Christella Hartigan regarding request for dietician referral; Reviewed scheduled/upcoming provider appointments including LCSW on 7/17, GYN on 8/18 and GI on 8/29; Assessed social determinant of health barriers;  Rescheduled missed appointment with LCSW on 02/06/22 @ 3:45pm Reviewed Dr. Larae Grooms note, he recommended patient to drink 2 carnation instant breakfast shakes a day Provided therapeutic listening  Patient Goals/Self-Care Activities: Take medications as prescribed   Attend all scheduled provider appointments Call provider office for new concerns or questions  Work with LCSW for managing stress       Follow  Up:  Patient agrees to Care Plan and Follow-up.  Plan: The Managed Medicaid care management team will reach out to the patient again over the next 60 days.  Date/time of next scheduled RN care management/care coordination outreach:  04/03/22 @ 3:30pm  Estanislado Emms RN, BSN [place]  [facility] RN Care Coordinator

## 2022-02-03 ENCOUNTER — Other Ambulatory Visit: Payer: Self-pay | Admitting: Gastroenterology

## 2022-02-03 ENCOUNTER — Other Ambulatory Visit: Payer: Self-pay

## 2022-02-03 ENCOUNTER — Ambulatory Visit (INDEPENDENT_AMBULATORY_CARE_PROVIDER_SITE_OTHER): Payer: Medicaid Other | Admitting: Clinical

## 2022-02-03 DIAGNOSIS — F0630 Mood disorder due to known physiological condition, unspecified: Secondary | ICD-10-CM | POA: Diagnosis not present

## 2022-02-03 DIAGNOSIS — K50919 Crohn's disease, unspecified, with unspecified complications: Secondary | ICD-10-CM | POA: Diagnosis not present

## 2022-02-03 DIAGNOSIS — R634 Abnormal weight loss: Secondary | ICD-10-CM

## 2022-02-03 DIAGNOSIS — K51919 Ulcerative colitis, unspecified with unspecified complications: Secondary | ICD-10-CM

## 2022-02-05 LAB — THIOPURINE METHYLTRANSFERASE (TPMT), RBC: Thiopurine Methyltransferase, RBC: 17 nmol/hr/mL RBC

## 2022-02-06 ENCOUNTER — Telehealth: Payer: Self-pay | Admitting: Gastroenterology

## 2022-02-06 ENCOUNTER — Other Ambulatory Visit: Payer: Medicaid Other

## 2022-02-06 ENCOUNTER — Other Ambulatory Visit: Payer: Self-pay

## 2022-02-06 DIAGNOSIS — A048 Other specified bacterial intestinal infections: Secondary | ICD-10-CM

## 2022-02-06 DIAGNOSIS — K51511 Left sided colitis with rectal bleeding: Secondary | ICD-10-CM

## 2022-02-06 DIAGNOSIS — K649 Unspecified hemorrhoids: Secondary | ICD-10-CM

## 2022-02-06 DIAGNOSIS — A498 Other bacterial infections of unspecified site: Secondary | ICD-10-CM

## 2022-02-06 NOTE — Telephone Encounter (Signed)
Returned call to patient regarding need for Hydrocodone refill.  Patient stated she is taking Hydrocodone most day every 6 hours as needed. She stated that she continues to have lower abdominal pain as well as left sided abdominal pain throughout the day.  She stated that "she doesn't get to leave the house that often as she is in so much pain constantly."  Please advise on hydrocodone refills.

## 2022-02-06 NOTE — Telephone Encounter (Signed)
Inbound call from patient stating she needs a refill for hydrocodone. Please advise.

## 2022-02-07 ENCOUNTER — Other Ambulatory Visit: Payer: Self-pay

## 2022-02-07 DIAGNOSIS — R634 Abnormal weight loss: Secondary | ICD-10-CM

## 2022-02-07 DIAGNOSIS — K51919 Ulcerative colitis, unspecified with unspecified complications: Secondary | ICD-10-CM

## 2022-02-07 DIAGNOSIS — R1030 Lower abdominal pain, unspecified: Secondary | ICD-10-CM

## 2022-02-07 MED ORDER — AZATHIOPRINE 100 MG PO TABS: 150.0000 mg | ORAL_TABLET | Freq: Every day | ORAL | 3 refills | Status: DC

## 2022-02-07 MED ORDER — HYDROCODONE-ACETAMINOPHEN 5-325 MG PO TABS
1.0000 | ORAL_TABLET | Freq: Four times a day (QID) | ORAL | 0 refills | Status: DC | PRN
Start: 2022-02-07 — End: 2022-03-24

## 2022-02-07 NOTE — Patient Instructions (Signed)
Angeliki Korb ,   The Medicaid Managed Care Team is available to provide assistance to you with your healthcare needs at no cost and as a benefit of your Medicaid Health plan. I'm sorry I was unable to reach you today for our scheduled appointment. Our care guide will call you to reschedule our telephone appointment. Please call me at the number below. I am available to be of assistance to you regarding your healthcare needs. .   Thank you,   Rebakah Cokley, BSW, MSW, LCSW Managed Medicaid LCSW [place]  [facility] [email or web address] Phone: [phone]   

## 2022-02-07 NOTE — Telephone Encounter (Signed)
Patient aware that she is scheduled for CT scan on 02-14-22 at 230pm.  Patient advised to pick up contrast at our office prior to the appointment.

## 2022-02-07 NOTE — Patient Outreach (Addendum)
[facility]) Care Management  02/06/2022  Lacey Perez 2001-02-27 [id number]  LCSW completed [facility] outreach attempt today on 02/06/22 during scheduled appointment time but was unable to reach patient successfully. A HIPPA compliant voice message was left encouraging patient to return call once available. [facility] LCSW received missed call and voice message from patient. [facility] Ms LCSW rescheduled patient's appointment for 02/08/22 at 1:00 pm.   Dickie La, BSW, MSW, LCSW Managed Medicaid LCSW [facility]  [facility] [email or web address] Phone: [phone]

## 2022-02-08 ENCOUNTER — Other Ambulatory Visit: Payer: Self-pay | Admitting: Licensed Clinical Social Worker

## 2022-02-08 NOTE — Patient Outreach (Signed)
Medicaid Managed Care Social Work Note  02/08/2022 Name:  Lacey Perez MRN:  [id number] DOB:  25-Dec-2000  Lacey Perez is an 21 y.o. year old female who is a primary patient of Pcp, No.  The Medicaid Managed Care Coordination team was consulted for assistance with:  [place] and Resources  [name] was given information about Medicaid Managed Care Coordination team services today. [name] Patient agreed to services and verbal consent obtained.  Engaged with patient  for by telephone forfollow up visit in response to referral for case management and/or care coordination services.   Assessments/Interventions:  Review of past medical history, allergies, medications, health status, including review of consultants reports, laboratory and other test data, was performed as part of comprehensive evaluation and provision of chronic care management services.  SDOH: (Social Determinant of Health) assessments and interventions performed: SDOH Interventions    Flowsheet Row Most Recent Value  SDOH Interventions   Stress Interventions Offered [employer] Resources       Advanced Directives Status:  See Care Plan for related entries.  Care Plan                 No Known Allergies  Medications Reviewed Today     Reviewed by Greg Cutter, LCSW (Social Worker) on 02/08/22 at 92  Med List Status: <None>   Medication Order Taking? Sig Documenting Provider Last Dose Status Informant  acetaminophen (TYLENOL) 500 MG tablet [id number] No Take 2,000 mg by mouth every 6 (six) hours as needed for mild pain. [provider] Taking Active Self  Adalimumab (HUMIRA PEN) 40 MG/0.4ML PNKT [id number] No Inject 1 pen. into the skin every 14 (fourteen) days. Milus Banister, MD Taking Active            Med Note (ROBB, MELANIE A   Thu Feb 02, 2022 10:27 AM)    Adalimumab [facility] PEN-CD/UC/HS STARTER) 80 MG/0.8ML PNKT [id number] No Inject 2 pens (160 mg total)  under the skin on day 1, inject 1 pen (80 mg total) under the skin on day 15 Milus Banister, MD Taking Active            Med Note [name] Feb 02, 2022 10:28 AM) Completed starter  Discontinued 10/11/19 1240 aspirin-acetaminophen-caffeine (EXCEDRIN MIGRAINE) 250-250-65 MG tablet [id number] No Take 2 tablets by mouth every 6 (six) hours as needed for headache.  Patient not taking: Reported on 02/02/2022   [provider] Not Taking Active Self  azathioprine (IMURAN) 100 MG tablet [id number]  Take 1.5 tablets (150 mg total) by mouth daily. Milus Banister, MD  Active   cyclobenzaprine (FLEXERIL) 10 MG tablet [id number] No Take 1 tablet (10 mg total) by mouth 2 (two) times daily as needed for muscle spasms.  Patient not taking: Reported on 01/31/2022   [name], PA-C Not Taking Active   dicyclomine (BENTYL) 20 MG tablet [id number] No Take 1 tablet (20 mg total) by mouth 3 (three) times daily as needed for spasms. Vladimir Crofts, PA-C Taking Expired 02/06/22 2359   fluticasone (FLONASE) 50 MCG/ACT nasal spray [id number] No Place 2 sprays into both nostrils daily.  Patient taking differently: Place 2 sprays into both nostrils daily as needed for allergies.   Melynda Ripple, MD Taking Active   Discontinued 10/11/19 1240 HYDROcodone-acetaminophen (NORCO/VICODIN) 5-325 MG tablet [id number]  Take 1 tablet by mouth every 6 (six) hours as needed for moderate pain. Milus Banister, MD  Active  ibuprofen (ADVIL) 600 MG tablet [id number] No Take 1 tablet (600 mg total) by mouth every 6 (six) hours as needed.  Patient not taking: Reported on 02/02/2022   Melynda Ripple, MD Not Taking Active     Discontinued 06/13/20 1454   mesalamine (LIALDA) 1.2 g EC tablet [id number] No Take 4 tablets (4.8 g total) by mouth daily with breakfast. Milus Banister, MD Taking Active   mesalamine (ROWASA) 4 g enema [id number] No Place 60 mLs (4 g total) rectally every other day. Discontinue after 1  week.  Patient not taking: Reported on 01/31/2022   Milus Banister, MD Not Taking Expired 01/31/22 2359   Discontinued 10/11/19 1240 omeprazole (PRILOSEC) 20 MG capsule [id number] No Take 1 capsule (20 mg total) by mouth daily before breakfast. 30 mins before food ideally  Patient not taking: Reported on 01/31/2022   Vladimir Crofts, PA-C Not Taking Expired 01/31/22 2359   Probiotic Product (PROBIOTIC PO) [id number] No Take 1 capsule by mouth daily. [provider] Taking Active Self            There are no problems to display for this patient.   Conditions to be addressed/monitored per PCP order:  Depression  Care Plan : LCSW Plan of Care  Updates made by Greg Cutter, LCSW since 02/08/2022 12:00 AM     Problem: Stress concerns      Long-Range Goal: Stress Symptoms Identified   Start Date: 12/13/2021  Priority: High  Note:   Priority: High  Timeframe:  Long-Range Goal Priority:  High Start Date:   12/13/21               Expected End Date:  02/08/22  - consider counseling or psychiatry, read email explaining the difference between the two -consider bumping up your self-care  -consider creating a stronger support network  Why is this important?             Beating depression/stress may take some time.            If you don't feel better right away, don't give up on your treatment plan.    Current barriers:   Chronic Mental Health needs related to stress. Patient requires Support, Education, Resources, Referrals, Advocacy, and Care Coordination, in order to meet Unmet Mental Health Needs. Patient will implement clinical interventions discussed today to decrease symptoms of stress and increase knowledge and/or ability of: coping skills. Mental Health Concerns and Social Isolation Patient lacks knowledge of available community counseling agencies and resources.  Clinical Goal(s): verbalize understanding of plan for management of Stress and demonstrate a  reduction in symptoms. Patient will consider connecting with a provider for ongoing mental health treatment, increase coping skills, healthy habits, self-management skills, and stress reduction        Clinical Interventions:  Assessed patient's previous and current treatment, coping skills, support system and barriers to care.  Verbalization of feelings encouraged, motivational interviewing employed Emotional support provided, positive coping strategies explored Self care/establishing healthy boundaries emphasized Patient reports that she does not know what counseling and psychiatry is. [facility] LCSW educated her on this. Email was sent to patient on 12/13/21 with the explanation of these services as well. Patient wants to consider these resources first before agreeing to a referral. UPDATE 12/27/21- Patient is interested in gaining counseling only. Referral made to [facility] New emails were sent to patient with information on [facility].  [facility] LCSW sent list of coping skills for stress management in email as  well for her to review and consider implementing.  LCSW provided education on relaxation techniques such as meditation, deep breathing, massage, grounding exercises or yoga that can activate the body's relaxation response and ease symptoms of stress. LCSW ask that when pt is struggling with difficult emotions and racing thoughts that they start this relaxation response process.  Patient will work on implementing appropriate self-care habits into their daily routine such as: staying positive, writing a gratitude list, drinking water, staying active around the house, taking their medications as prescribed, combating negative thoughts or emotions and staying connected with their family and friends. Positive reinforcement provided for this decision to work on this. Motivational Interviewing employed Depression screen reviewed  Mindfulness or Relaxation training provided Active listening / Reflection utilized   Emotional Support Provided Problem [place] strategies reviewed Provided psychoeducation for mental health needs  Provided brief CBT  Reviewed mental health medications and discussed importance of compliance:  Quality of sleep assessed & Sleep Hygiene techniques promoted  Participation in counseling encouraged  Verbalization of feelings encouraged  Suicidal Ideation/Homicidal Ideation assessed: Patient denies SI/HI  Review resources, discussed options and provided patient information about  Mental Health Resource Education Inter-disciplinary care team collaboration (see longitudinal plan of care)   UPDATE 02/08/22- Patient has been successfully established with a mental health counselor at [facility]. She has already met with counselor and completed her intake. Patient will start therapy next month. She has two future counseling appointments scheduled. Patient denies any future mental health concerns and is agreeable to social work discharge at this time. [place] LCSW will update [facility] RNCM and close case as goal is now met.   Patient Goals/Self-Care Activities: Over the next 120 days Attend scheduled medical appointments Utilize healthy coping skills and supportive resources discussed Contact PCP with any questions or concerns Consider gaining counseling and psychiatry services Implement self-care into your daily routine Call your insurance provider for more information about your Enhanced Benefits  Check out counseling resources provided  Begin personal counseling with LCSW, to reduce and manage symptoms of Depression and Stress, until well-established with mental health provider Incorporate into daily practice - relaxation techniques, deep breathing exercises, and mindfulness meditation strategies. Talk about feelings with friends, family members, spiritual advisor, etc. Contact LCSW directly [phone]), if you have questions, need assistance, or if additional social work needs  are identified between now and our next scheduled telephone outreach call. Call [phone] for mental health hotline/crisis line if needed (24/7 available) Try techniques to reduce symptoms of anxiety/negative thinking (deep breathing, distraction, positive self talk, etc)  - develop a personal safety plan - develop a plan to deal with triggers like holidays, anniversaries - exercise at least 2 to 3 times per week - have a plan for how to handle bad days - journal feelings and what helps to feel better or worse - spend time or talk with others at least 2 to 3 times per week - watch for early signs of feeling worse - begin personal counseling - call and visit an old friend - check out volunteer opportunities - join a support group - laugh; watch a funny movie or comedian - learn and use visualization or guided imagery - perform a random act of kindness - practice relaxation or meditation daily - start or continue a personal journal - practice positive thinking and self-talk -continue with compliance of taking medication  -identify current effective and ineffective coping strategies.  -implement positive self-talk in care to increase self-esteem, confidence and  feelings of control.  -consider alternative and complementary therapy approaches such as meditation, mindfulness or yoga.  -journaling, prayer, worship services, meditation or pastoral counseling.  -increase participation in pleasurable group activities such as hobbies, singing, sports or volunteering).  -consider the use of meditative movement therapy such as tai chi, yoga or qigong.  -start a regular daily exercise program based on tolerance, ability and patient choice to support positive thinking and activity     Patient Goals: Follow up goal      Follow up:  Patient requests no follow-up at this time.  Plan: The Managed Medicaid care management team is available to follow up with the patient after provider conversation with the  patient regarding recommendation for care management engagement and subsequent re-referral to the care management team.   Eula Fried, BSW, MSW, LCSW Managed Medicaid LCSW [email or web address] Phone: [phone]

## 2022-02-08 NOTE — Patient Instructions (Signed)
Visit Information  Ms. Hehr was given information about Medicaid Managed Care team care coordination services as a part of their Healthy [facility] Medicaid benefit. Novah Yam verbally consented to engagement with the [facility] Managed Care team.   If you are experiencing a medical emergency, please call 911 or report to your local emergency department or urgent care.   If you have a non-emergency medical problem during routine business hours, please contact your provider's office and ask to speak with a nurse.   For questions related to your Healthy [facility] health plan, please call: [phone] or visit the homepage here: [email or web address]  If you would like to schedule transportation through your Healthy [facility] plan, please call the following number at least 2 days in advance of your appointment: [phone]  For information about your ride after you set it up, call Ride Assist at [phone]. Use this number to activate a Will Call pickup, or if your transportation is late for a scheduled pickup. Use this number, too, if you need to make a change or cancel a previously scheduled reservation.  If you need transportation services right away, call [phone]. The after-hours call center is staffed 24 hours to handle ride assistance and urgent reservation requests (including discharges) 365 days a year. Urgent trips include sick visits, hospital discharge requests and life-sustaining treatment.  Call the [place] at [phone], at any time, 24 hours a day, 7 days a week. If you are in danger or need immediate medical attention call 911.  If you would like help to quit smoking, call 1-800-QUIT-NOW (440) 100-5095) OR Espaol: 1-855-Djelo-Ya (4-270-623-7628) o para ms informacin haga clic aqu or Text READY to 200-400 to register via text   Following is a copy of your plan of care:  Care Plan : LCSW Plan of Care   Updates made by Greg Cutter, LCSW since 02/08/2022 12:00 AM     Problem: Stress concerns      Long-Range Goal: Stress Symptoms Identified   Start Date: 12/13/2021  Priority: High  Note:   Priority: High  Timeframe:  Long-Range Goal Priority:  High Start Date:   12/13/21               Expected End Date:  02/08/22  - consider counseling or psychiatry, read email explaining the difference between the two -consider bumping up your self-care  -consider creating a stronger support network  Why is this important?             Beating depression/stress may take some time.            If you don't feel better right away, don't give up on your treatment plan.    Current barriers:   Chronic Mental Health needs related to stress. Patient requires Support, Education, Resources, Referrals, Advocacy, and Care Coordination, in order to meet Unmet Mental Health Needs. Patient will implement clinical interventions discussed today to decrease symptoms of stress and increase knowledge and/or ability of: coping skills. Mental Health Concerns and Social Isolation Patient lacks knowledge of available community counseling agencies and resources.  Clinical Goal(s): verbalize understanding of plan for management of Stress and demonstrate a reduction in symptoms. Patient will consider connecting with a provider for ongoing mental health treatment, increase coping skills, healthy habits, self-management skills, and stress reduction        Patient Goals/Self-Care Activities: Over the next 120 days Attend scheduled medical appointments Utilize healthy coping skills and supportive resources discussed Contact PCP with any  questions or concerns Consider gaining counseling and psychiatry services Implement self-care into your daily routine Call your insurance provider for more information about your Enhanced Benefits  Check out counseling resources provided  Begin personal counseling with LCSW, to reduce and  manage symptoms of Depression and Stress, until well-established with mental health provider Incorporate into daily practice - relaxation techniques, deep breathing exercises, and mindfulness meditation strategies. Talk about feelings with friends, family members, spiritual advisor, etc. Contact LCSW directly [phone]), if you have questions, need assistance, or if additional social work needs are identified between now and our next scheduled telephone outreach call. Call 988 for mental health hotline/crisis line if needed (24/7 available) Try techniques to reduce symptoms of anxiety/negative thinking (deep breathing, distraction, positive self talk, etc)  - develop a personal safety plan - develop a plan to deal with triggers like holidays, anniversaries - exercise at least 2 to 3 times per week - have a plan for how to handle bad days - journal feelings and what helps to feel better or worse - spend time or talk with others at least 2 to 3 times per week - watch for early signs of feeling worse - begin personal counseling - call and visit an old friend - check out volunteer opportunities - join a support group - laugh; watch a funny movie or comedian - learn and use visualization or guided imagery - perform a random act of kindness - practice relaxation or meditation daily - start or continue a personal journal - practice positive thinking and self-talk -continue with compliance of taking medication  -identify current effective and ineffective coping strategies.  -implement positive self-talk in care to increase self-esteem, confidence and feelings of control.  -consider alternative and complementary therapy approaches such as meditation, mindfulness or yoga.  -journaling, prayer, worship services, meditation or pastoral counseling.  -increase participation in pleasurable group activities such as hobbies, singing, sports or volunteering).  -consider the use of meditative movement  therapy such as tai chi, yoga or qigong.  -start a regular daily exercise program based on tolerance, ability and patient choice to support positive thinking and activity     Patient Goals: Follow up goal  Eula Fried, BSW, MSW, [employer] Managed Medicaid LCSW [email or web address] Phone: [phone]

## 2022-02-10 LAB — ADALIMUMAB+AB (SERIAL MONITOR)
Adalimumab Drug Level: 9.6 ug/mL
Anti-Adalimumab Antibody: <25 ng/mL

## 2022-02-14 ENCOUNTER — Ambulatory Visit (HOSPITAL_COMMUNITY)
Admission: RE | Admit: 2022-02-14 | Discharge: 2022-02-14 | Disposition: A | Discharge status: Home / Self Care | Payer: Medicaid Other | Source: Ambulatory Visit | Attending: Gastroenterology | Admitting: Gastroenterology

## 2022-02-14 DIAGNOSIS — R634 Abnormal weight loss: Secondary | ICD-10-CM | POA: Insufficient documentation

## 2022-02-14 DIAGNOSIS — R1030 Lower abdominal pain, unspecified: Secondary | ICD-10-CM | POA: Diagnosis not present

## 2022-02-14 MED ORDER — IOHEXOL 300 MG/ML  SOLN
100.0000 mL | Freq: Once | INTRAMUSCULAR | Status: AC | PRN
Start: 2022-02-14 — End: 2022-02-14
  Administered 2022-02-14: 100 mL via INTRAVENOUS

## 2022-02-14 MED ORDER — SODIUM CHLORIDE (PF) 0.9 % IJ SOLN
INTRAMUSCULAR | Status: AC
  Filled 2022-02-14: qty 50

## 2022-02-15 ENCOUNTER — Telehealth: Payer: Self-pay | Admitting: Gastroenterology

## 2022-02-16 NOTE — Telephone Encounter (Signed)
Patient is calling to follow up on refill request for Hydrocortisone. Asked for a call back today.

## 2022-02-16 NOTE — Telephone Encounter (Signed)
Patient aware that Dr Christella Hartigan will no longer refill Hydrocodone and she we are in the process of sending a referral to pain management.  Left message for at [facility] [phone] for referral team to return my call to advise whether or not they will accept this patient for abdominal pain.

## 2022-02-25 NOTE — Progress Notes (Signed)
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  Client reported she is referred by [facility] health employee for clinical assessment. Client reported in December 2022 she was diagnosed with Crohn's disease.  Client reported she was medically discharged from the [employer] after 3 years of service due to her condition.  Client reported her family thinks that she is brushing off her emotions about her diagnosis. Client reported she does not like to act in a way of soaking and likes to as much normalcy about her daily routine as possible. Client reported she does have times where she sits and does not do anything because she is in pain and must constantly go to the bathroom.  Client reported she is hesitant to go out to places and do things because of her sporadic problems with incontinence.  Client reported she does have days when she cries and other days when she is fine.  Client reported no history of outpatient or inpatient services for mental health symptoms.  Client denied illicit substance use. Client presented oriented x5, appropriately dressed, and friendly.  Client denied hallucinations, delusions, suicidal and homicidal ideations.  Client was screened for pain, nutrition and [place] suicide severity.  Treatment recommendations: Individual counseling. Client denied the need for psychiatry.   CCA Biopsychosocial Intake/Chief Complaint:  Client is presenting due to symptoms of depression and anxiety.  Client reported her symptoms have been reoccurring since December 2022 when she was diagnosed with Crohn's disease.  Current Symptoms/Problems: Client reported anxiety  about being outside the home, crying spells, overthinking  Patient Reported Schizophrenia/Schizoaffective Diagnosis in Past: No  Strengths: Family and social support  Preferences: Counseling  Abilities: No data recorded  Type of Services Patient Feels are Needed: Individual therapy  Initial Clinical Notes/Concerns: No data recorded  Mental Health Symptoms Depression:   Change in energy/activity; Tearfulness   Duration of Depressive symptoms:  Greater than two weeks   Mania:   None   Anxiety:    Worrying   Psychosis:   None   Duration of Psychotic symptoms: No data recorded  Trauma:   None   Obsessions:   None   Compulsions:   None   Inattention:   None   Hyperactivity/Impulsivity:   None   Oppositional/Defiant Behaviors:   None   Emotional Irregularity:   None   Other Mood/Personality Symptoms:  No data recorded   Mental Status Exam Appearance and self-care  Stature:   Average   Weight:   Average weight   Clothing:   Casual   Grooming:   Normal   Cosmetic use:   Age appropriate   Posture/gait:   Normal   Motor activity:   Not Remarkable   Sensorium  Attention:   Normal   Concentration:   Normal   Orientation:   X5   Recall/memory:   Normal   Affect and Mood  Affect:   Congruent   Mood:   Depressed   Relating  Eye contact:   Normal   Facial expression:   Responsive   Attitude toward examiner:   Cooperative   Thought and Language  Speech flow:  Clear and Coherent   Thought content:   Appropriate to Mood and  Circumstances   Preoccupation:   None   Hallucinations:   None   Organization:  No data recorded  [employer] of Knowledge:   Good   Intelligence:   Average   Abstraction:   Normal   Judgement:   Good   Reality Testing:   Adequate   Insight:   Good   Decision Making:   Normal   Social Functioning  Social Maturity:   Isolates; Responsible   Social Judgement:    Normal   Stress  Stressors:   Illness   Coping Ability:   Resilient; Overwhelmed   Skill Deficits:   Self-care   Supports:   Family; Friends/Service system     Religion: Religion/Spirituality Are You A Religious Person?: No  Leisure/Recreation: Leisure / Recreation Do You Have Hobbies?: No  Exercise/Diet: Exercise/Diet Do You Exercise?: No Have You Gained or Lost A Significant Amount of Weight in the Past Six Months?: No Do You Follow a Special Diet?: No Do You Have Any Trouble Sleeping?: No   CCA Employment/Education Employment/Work Situation: Employment / Work Situation Employment Situation: Employed Where is Patient Currently Employed?: 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 Does patient have children?: No  Childhood History:  Childhood History By whom was/is the patient raised?: Mother Additional childhood history information: Client reported she is from [place] and was raised by her mother.  Client reported her dad did 10 years in prison. Patient's description of current relationship with people who raised him/her: Client reported her biological father tries to have a relationship with her but distant relationship.  Client reported she does stay in communication with her mother. Does patient have siblings?: Yes Number of Siblings: 1 Description of patient's current relationship with siblings: Client reported she has a younger sister Did patient suffer any verbal/emotional/physical/sexual abuse as a child?: No Did patient  suffer from severe childhood neglect?: No Has patient ever been sexually abused/assaulted/raped as an adolescent or adult?: No Was the patient ever a victim of a crime or a disaster?: No Witnessed domestic violence?: No Has patient been affected by domestic violence as an adult?: No  Child/Adolescent Assessment:     CCA Substance Use Alcohol/Drug Use: Alcohol / Drug Use History of alcohol / drug use?: No history of alcohol / drug abuse                         ASAM's:  Six Dimensions of Multidimensional Assessment  Dimension 1:  Acute Intoxication and/or Withdrawal Potential:      Dimension 2:  Biomedical Conditions and Complications:      Dimension 3:  Emotional, Behavioral, or Cognitive Conditions and Complications:     Dimension 4:  Readiness to Change:     Dimension 5:  Relapse, Continued use, or Continued Problem Potential:     Dimension 6:  Recovery/Living Environment:     ASAM Severity Score:    ASAM Recommended Level of Treatment:     Substance use Disorder (SUD)    Recommendations for Services/Supports/Treatments: Recommendations for Services/Supports/Treatments Recommendations For Services/Supports/Treatments: Medication Management, Individual Therapy  DSM5 Diagnoses: There are no problems to display for this patient.   Patient Centered Plan: Patient is on the following Treatment Plan(s):  Depression   Referrals to Alternative Service(s): Referred to Alternative Service(s):   Place:   Date:   Time:    Referred to Alternative Service(s):   Place:   Date:   Time:    Referred to Alternative Service(s):   Place:   Date:   Time:    Referred to Alternative Service(s):   Place:   Date:   Time:      Collaboration of Care: Referral or follow-up with counselor/therapist [facility]  Patient/Guardian was advised Release of Information must be obtained prior to any record release in order to collaborate their care with an outside provider. Patient/Guardian  was advised if they have not already done so to contact the registration department to sign all necessary forms in order for [place] to release information regarding their care.   Consent: Patient/Guardian gives verbal consent for treatment and assignment of benefits for services provided during this visit. Patient/Guardian expressed understanding and agreed to proceed.   Neena Rhymes Lacey Henrikson, LCSW

## 2022-02-25 NOTE — Plan of Care (Signed)
  Problem: Depression CCP Problem  1  Goal: LTG: Lacey Perez WILL SCORE LESS THAN 10 ON THE PATIENT HEALTH QUESTIONNAIRE (PHQ-9) Outcome: Initial Goal: STG: Reduce overall depression score by a minimum of 25% on the Patient Health Questionnaire (PHQ-9) or the Montgomery-Asberg Depression Rating Scale (MADRS) Outcome: Initial

## 2022-03-08 ENCOUNTER — Encounter: Payer: Self-pay | Admitting: Gastroenterology

## 2022-03-10 ENCOUNTER — Encounter (HOSPITAL_BASED_OUTPATIENT_CLINIC_OR_DEPARTMENT_OTHER): Payer: Medicaid Other | Admitting: Medical

## 2022-03-21 ENCOUNTER — Ambulatory Visit: Payer: Medicaid Other | Admitting: Gastroenterology

## 2022-03-24 ENCOUNTER — Ambulatory Visit (HOSPITAL_BASED_OUTPATIENT_CLINIC_OR_DEPARTMENT_OTHER): Payer: Medicaid Other | Admitting: Medical

## 2022-03-24 ENCOUNTER — Encounter (HOSPITAL_BASED_OUTPATIENT_CLINIC_OR_DEPARTMENT_OTHER): Payer: Self-pay | Admitting: Medical

## 2022-03-24 ENCOUNTER — Other Ambulatory Visit (HOSPITAL_COMMUNITY)
Admission: RE | Admit: 2022-03-24 | Discharge: 2022-03-24 | Disposition: A | Discharge status: Home / Self Care | Payer: Medicaid Other | Source: Ambulatory Visit | Attending: Medical | Admitting: Medical

## 2022-03-24 VITALS — BP 121/89 | HR 78 | Ht 66.0 in | Wt 135.0 lb

## 2022-03-24 DIAGNOSIS — N760 Acute vaginitis: Secondary | ICD-10-CM

## 2022-03-24 DIAGNOSIS — Z113 Encounter for screening for infections with a predominantly sexual mode of transmission: Secondary | ICD-10-CM | POA: Insufficient documentation

## 2022-03-24 DIAGNOSIS — Z124 Encounter for screening for malignant neoplasm of cervix: Secondary | ICD-10-CM | POA: Diagnosis not present

## 2022-03-24 DIAGNOSIS — Z01419 Encounter for gynecological examination (general) (routine) without abnormal findings: Secondary | ICD-10-CM

## 2022-03-24 DIAGNOSIS — R1030 Lower abdominal pain, unspecified: Secondary | ICD-10-CM | POA: Insufficient documentation

## 2022-03-24 DIAGNOSIS — B9689 Other specified bacterial agents as the cause of diseases classified elsewhere: Secondary | ICD-10-CM

## 2022-03-24 NOTE — Progress Notes (Signed)
History:  [name] is a 21 y.o. female who presents to clinic today for annual exam. She is a new patient to our office. She has never had a pap smear. Last intercourse was unprotected ~ 3 months ago. She denies abnormal discharge, bleeding, pain, UTI symptoms or breast concerns. She has chronic abdominal pain due to UC managed by GI.  Cycles are regular lasting 40-45 days.  The following portions of the patient's history were reviewed and updated as appropriate: allergies, current medications, family history, past medical history, social history, past surgical history and problem list.  Review of Systems:  Review of Systems  Constitutional:  Negative for fever and malaise/fatigue.  Gastrointestinal:  Positive for abdominal pain. Negative for constipation, diarrhea, nausea and vomiting.  Genitourinary:  Negative for dysuria, frequency and urgency.       Neg - vaginal bleeding, discharge, pelvic pain      Objective:  Physical Exam BP 121/89 (BP Location: Right Arm, Patient Position: Sitting, Cuff Size: Normal)   Pulse 78   Ht 5\' 6"  (1.676 m)   Wt 135 lb (61.2 kg)   LMP 03/07/2022 (Exact Date)   SpO2 100%   BMI 21.79 kg/m  Physical Exam Vitals and nursing note reviewed. Exam conducted with a chaperone present.  Constitutional:      General: She is not in acute distress.    Appearance: Normal appearance. She is well-developed and normal weight.  HENT:     Head: Normocephalic and atraumatic.  Neck:     Thyroid: No thyromegaly.  Cardiovascular:     Rate and Rhythm: Normal rate and regular rhythm.     Heart sounds: No murmur heard. Pulmonary:     Effort: Pulmonary effort is normal. No respiratory distress.     Breath sounds: Normal breath sounds. No wheezing.  Chest:  Breasts:    Right: Normal.     Left: Normal.  Abdominal:     General: Abdomen is flat. Bowel sounds are normal. There is no distension.     Palpations: Abdomen is soft. There is no mass.      Tenderness: There is no abdominal tenderness. There is no guarding or rebound.  Genitourinary:    General: Normal vulva.     Vagina: No vaginal discharge, erythema, tenderness or bleeding.     Cervix: No cervical motion tenderness, discharge, friability, lesion, erythema or cervical bleeding.     Uterus: Not enlarged and not tender.      Adnexa:        Right: No mass or tenderness.         Left: No mass or tenderness.    Musculoskeletal:     Cervical back: Neck supple.  Skin:    General: Skin is warm and dry.     Findings: No erythema.  Neurological:     Mental Status: She is alert and oriented to person, place, and time.  Psychiatric:        Mood and Affect: Mood normal.     Health Maintenance Due  Topic Date Due   HPV VACCINES (1 - 2-dose series) Never done   HIV Screening  Never done   Hepatitis C Screening  Never done   PAP-Cervical Cytology Screening  Never done   PAP SMEAR-Modifier  Never done   INFLUENZA VACCINE  Never done    Labs, imaging and previous visits in Epic and Care Everywhere reviewed  Assessment & Plan:  1. Well female exam with routine gynecological exam  2.  Cervical cancer screening - Cytology - PAP( [place])  3. Screen for STD (sexually transmitted disease) - Cervicovaginal ancillary only( [place]) - HIV Antibody (routine testing w rflx) - [facility] - Hepatitis C antibody - Hepatitis B surface antigen  4. Lower abdominal pain - Cervicovaginal ancillary only( [place])  Results will be shared with the patient through MyChart Return to [facility] in 1 year or sooner PRN   [name], PA-C 03/24/2022 10:03 AM

## 2022-03-25 LAB — HIV ANTIBODY (ROUTINE TESTING W REFLEX): HIV Screen 4th Generation wRfx: NONREACTIVE

## 2022-03-25 LAB — RPR: RPR Ser Ql: NONREACTIVE

## 2022-03-25 LAB — HEPATITIS C ANTIBODY: Hep C Virus Ab: NONREACTIVE

## 2022-03-25 LAB — HEPATITIS B SURFACE ANTIGEN: Hepatitis B Surface Ag: NEGATIVE

## 2022-03-28 LAB — CERVICOVAGINAL ANCILLARY ONLY
Bacterial Vaginitis (gardnerella): POSITIVE — AB
Candida Glabrata: NEGATIVE
Candida Vaginitis: NEGATIVE
Chlamydia: NEGATIVE
Comment: NEGATIVE
Comment: NEGATIVE
Comment: NEGATIVE
Comment: NEGATIVE
Comment: NEGATIVE
Comment: NORMAL
Neisseria Gonorrhea: NEGATIVE
Trichomonas: NEGATIVE

## 2022-03-29 MED ORDER — METRONIDAZOLE 500 MG PO TABS: 500.0000 mg | ORAL_TABLET | Freq: Two times a day (BID) | ORAL | 0 refills | Status: DC

## 2022-03-29 NOTE — Addendum Note (Signed)
Addended by: [name] on: 03/29/2022 08:24 AM   Modules accepted: Orders

## 2022-03-30 LAB — CYTOLOGY - PAP
Adequacy: ABSENT
Diagnosis: NEGATIVE

## 2022-04-03 ENCOUNTER — Other Ambulatory Visit: Payer: Self-pay | Admitting: *Deleted

## 2022-04-03 NOTE — Patient Instructions (Signed)
Visit Information  Lacey Perez was given information about Medicaid Managed Care team care coordination services as a part of their Healthy [facility] Medicaid benefit. Lacey Perez verbally consented to engagement with the [facility] Managed Care team.   If you are experiencing a medical emergency, please call 911 or report to your local emergency department or urgent care.   If you have a non-emergency medical problem during routine business hours, please contact your provider's office and ask to speak with a nurse.   For questions related to your Healthy [facility] health plan, please call: [phone] or visit the homepage here: [email or web address]  If you would like to schedule transportation through your Healthy [facility] plan, please call the following number at least 2 days in advance of your appointment: [phone]  For information about your ride after you set it up, call Ride Assist at [phone]. Use this number to activate a Will Call pickup, or if your transportation is late for a scheduled pickup. Use this number, too, if you need to make a change or cancel a previously scheduled reservation.  If you need transportation services right away, call [phone]. The after-hours call center is staffed 24 hours to handle ride assistance and urgent reservation requests (including discharges) 365 days a year. Urgent trips include sick visits, hospital discharge requests and life-sustaining treatment.  Call the [facility] Line at [phone], at any time, 24 hours a day, 7 days a week. If you are in danger or need immediate medical attention call 911.  If you would like help to quit smoking, call 1-800-QUIT-NOW (305 172 0173) OR Espaol: 1-855-Djelo-Ya (5-188-416-6063) o para ms informacin haga clic aqu or Text READY to 016-010 to register via text  Lacey Perez,   Please see education materials related to Health  Maintenance provided by MyChart link.  Patient verbalizes understanding of instructions and care plan provided today and agrees to view in MyChart. Active MyChart status and patient understanding of how to access instructions and care plan via MyChart confirmed with patient.     Telephone follow up appointment with Managed Medicaid care management team member scheduled for:06/05/22 @ 3:30pm  Estanislado Emms RN, BSN [place]  [facility] RN Care Coordinator   Following is a copy of your plan of care:  Care Plan : RN Care Manager Plan of Care  Updates made by Heidi Dach, RN since 04/03/2022 12:00 AM     Problem: Health Management needs related to Ulcerative Colitis      Long-Range Goal: Development of Plan of Care to address Health Management needs related to Ulcerative Colitis   Start Date: 12/07/2021  Expected End Date: 06/05/2022  Priority: High  Note:   Current Barriers:  Chronic Disease Management support and education needs related to Ulcerative Colitis  Lacey Perez continues to have about 10 episodes of diarrhea per day. She does reports decreased amount of blood in her stool. She is trying to avoid fried and fatty foods. She is working with a Paramedic, her next appointment is 04/10/22.  RNCM Clinical Goal(s):  Patient will verbalize understanding of plan for management of Ulcerative Colitis as evidenced by patient reports take all medications exactly as prescribed and will call provider for medication related questions as evidenced by patient reports and documentation in EMR    attend all scheduled medical appointments: 04/04/22 with Nutrition, Therapy on 04/10/22, 05/30/22 with GI and scheduling follow up with PCP to discuss pain management as evidenced by provider documentation in EMR  work with Child psychotherapist to address Mental Health Concerns  related to the management of Ulcerative Colitis as evidenced by review of EMR and patient or Child psychotherapist report      through collaboration with Medical illustrator, provider, and care team.   Interventions: Inter-disciplinary care team collaboration (see longitudinal plan of care) Evaluation of current treatment plan related to  self management and patient's adherence to plan as established by provider   Ulcerative Colitis  (Status: Goal on Track (progressing): YES.) Long Term Goal  Evaluation of current treatment plan related to  Ulcerative Colitis ,  self-management and patient's adherence to plan as established by provider. Discussed plans with patient for ongoing care management follow up and provided patient with direct contact information for care management team Advised patient to journal her symptoms and pain to track triggers; Provided education to patient re: health maintenance; Reviewed medications with patient and discussed humira and its side effects; Reviewed scheduled/upcoming provider appointments including 04/04/22 with Nutrition, Therapy on 04/10/22, 05/30/22 with GI and scheduling follow up with PCP to discuss pain management as advised by GI provider; Assessed social determinant of health barriers;  Provided therapeutic listening  Patient Goals/Self-Care Activities: Take medications as prescribed   Attend all scheduled provider appointments Call provider office for new concerns or questions  Work with LCSW for managing stress

## 2022-04-03 NOTE — Patient Outreach (Signed)
Medicaid Managed Care   Nurse Care Manager Note  04/03/2022 Name:  Lacey Perez MRN:  [id number] DOB:  2000/12/11  Lacey Perez is an 21 y.o. year old female who is a primary patient of [facility]  The [facility] Managed Care Coordination team was consulted for assistance with:    Ulcerative Colitis  Lacey Perez was given information about Medicaid Managed Care Coordination team services today. Lacey Perez Patient agreed to services and verbal consent obtained.  Engaged with patient by telephone for follow up visit in response to provider referral for case management and/or care coordination services.   Assessments/Interventions:  Review of past medical history, allergies, medications, health status, including review of consultants reports, laboratory and other test data, was performed as part of comprehensive evaluation and provision of chronic care management services.  SDOH (Social Determinants of Health) assessments and interventions performed: SDOH Interventions    Flowsheet Row Patient Outreach Telephone from 04/03/2022 in [facility] [employer] [facility] Coordination Patient Outreach Telephone from 02/08/2022 in [facility] [employer] [facility] Coord[facility] Patient Outreach Telephone from 12/27/2021 in [facility] [employer] [facility] Patient Outreach Telephone from 12/13/2021 in [facility] [employer] [facility] Patient Outreach Telephone from 12/07/2021 in [facility] [employer] [facility]  SDOH Interventions       Food Insecurity Interventions -- -- -- -- Intervention Not Indicated  Housing Interventions Intervention Not Indicated -- -- -- Intervention Not Indicated  Transportation Interventions Intervention Not Indicated -- -- -- Intervention Not Indicated  Utilities Interventions Intervention Not Indicated -- -- -- --  Stress Interventions -- [employer] [facility] Resources  Offered [employer], Provide Counseling Offered [employer] Resources, Provide Counseling --       Care Plan  No Known Allergies  Medications Reviewed Today     Reviewed by Heidi Dach, RN (Registered Nurse) on 04/03/22 at 1550  Med List Status: <None>   Medication Order Taking? Sig Documenting Provider Last Dose Status Informant  acetaminophen (TYLENOL) 500 MG tablet [id number] Yes Take 2,000 mg by mouth every 6 (six) hours as needed for mild pain. [provider] Taking Active Self  Adalimumab (HUMIRA PEN) 40 MG/0.4ML PNKT [id number] No Inject 1 pen. into the skin every 14 (fourteen) days.  Patient not taking: Reported on 04/03/2022   [name], MD Not Taking Active            Med Note (Audryana Hockenberry A   Thu Feb 02, 2022 10:27 AM)    Adalimumab [facility] PEN-CD/UC/HS STARTER) 80 MG/0.8ML PNKT [id number] Yes Inject 2 pens (160 mg total) under the skin on day 1, inject 1 pen (80 mg total) under the skin on day 15 [name], MD Taking Active            Med Note [name] Feb 02, 2022 10:28 AM) Completed starter    Discontinued 10/11/19 1240 azathioprine (IMURAN) 100 MG tablet [id number] Yes Take 1.5 tablets (150 mg total) by mouth daily. [name], MD Taking Active   dicyclomine (BENTYL) 20 MG tablet [id number] Yes Take 1 tablet (20 mg total) by mouth 3 (three) times daily as needed for spasms. [name], PA-C Taking Active   fluticasone [facility]) 50 MCG/ACT nasal spray [id number] Yes Place 2 sprays into both nostrils daily.  Patient taking differently: Place 2 sprays into both nostrils daily as needed for allergies.   [name], MD Taking Active     Discontinued  10/11/19 1240   Discontinued 06/13/20 1454   mesalamine (LIALDA) 1.2 g EC tablet [id number] Yes Take 4 tablets (4.8 g total) by mouth daily with breakfast. [name], MD Taking Active   metroNIDAZOLE (FLAGYL) 500 MG tablet [id number] Yes Take 1  tablet (500 mg total) by mouth 2 (two) times daily. [name], PA-C Taking Active     Discontinued 10/11/19 1240 Probiotic Product (PROBIOTIC PO) [id number] Yes Take 1 capsule by mouth daily. [provider] Taking Active Self            There are no problems to display for this patient.   Conditions to be addressed/monitored per PCP order:   Ulcerative Colitis  Care Plan : RN Care Manager Plan of Care  Updates made by Heidi Dach, RN since 04/03/2022 12:00 AM     Problem: Health Management needs related to Ulcerative Colitis      Long-Range Goal: Development of Plan of Care to address Health Management needs related to Ulcerative Colitis   Start Date: 12/07/2021  Expected End Date: 06/05/2022  Priority: High  Note:   Current Barriers:  Chronic Disease Management support and education needs related to Ulcerative Colitis  Lacey Perez continues to have about 10 episodes of diarrhea per day. She does reports decreased amount of blood in her stool. She is trying to avoid fried and fatty foods. She is working with a Paramedic, her next appointment is 04/10/22.  RNCM Clinical Goal(s):  Patient will verbalize understanding of plan for management of Ulcerative Colitis as evidenced by patient reports take all medications exactly as prescribed and will call provider for medication related questions as evidenced by patient reports and documentation in EMR    attend all scheduled medical appointments: 04/04/22 with Nutrition, Therapy on 04/10/22, 05/30/22 with GI and scheduling follow up with PCP to discuss pain management as evidenced by provider documentation in EMR        work with Child psychotherapist to address Mental Health Concerns  related to the management of Ulcerative Colitis as evidenced by review of EMR and patient or social worker report     through collaboration with Medical illustrator, provider, and care team.   Interventions: Inter-disciplinary care team collaboration  (see longitudinal plan of care) Evaluation of current treatment plan related to  self management and patient's adherence to plan as established by provider   Ulcerative Colitis  (Status: Goal on Track (progressing): YES.) Long Term Goal  Evaluation of current treatment plan related to  Ulcerative Colitis ,  self-management and patient's adherence to plan as established by provider. Discussed plans with patient for ongoing care management follow up and provided patient with direct contact information for care management team Advised patient to journal her symptoms and pain to track triggers; Provided education to patient re: health maintenance; Reviewed medications with patient and discussed humira and its side effects; Reviewed scheduled/upcoming provider appointments including 04/04/22 with Nutrition, Therapy on 04/10/22, 05/30/22 with GI and scheduling follow up with PCP to discuss pain management as advised by GI provider; Assessed social determinant of health barriers;  Provided therapeutic listening  Patient Goals/Self-Care Activities: Take medications as prescribed   Attend all scheduled provider appointments Call provider office for new concerns or questions  Work with LCSW for managing stress       Follow Up:  Patient agrees to Care Plan and Follow-up.  Plan: The Managed Medicaid care management team will reach out to the patient again over the next 60  days.  Date/time of next scheduled RN care management/care coordination outreach:  06/05/22 @ 3:30pm  Estanislado Emms RN, BSN [place]  [facility] RN Care Coordinator

## 2022-04-04 ENCOUNTER — Encounter: Payer: Self-pay | Admitting: Skilled Nursing Facility1

## 2022-04-04 ENCOUNTER — Encounter: Payer: Medicaid Other | Attending: Gastroenterology | Admitting: Skilled Nursing Facility1

## 2022-04-04 DIAGNOSIS — K51911 Ulcerative colitis, unspecified with rectal bleeding: Secondary | ICD-10-CM | POA: Diagnosis not present

## 2022-04-04 NOTE — Progress Notes (Signed)
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  Pt states she constantly has diarrhea but with the medications the amount of blood she was losing has lessened but still has abdominal pain. Pt states every time she eats she has to run to the bathroom.    Pt states she still tries to eat throughout the day.   Pt states she feels lights are really bright at night.   Pt states she does not eat a whole meal becase her stomach feels too bad    Estimated daily fluid intake: *** oz Supplements: woman's multivitamin, tumeric  Sleep: wakes every 2 hours to diarrhea in the night Stress / self-care: fair Current average weekly physical activity: walking daily because of work  24-Hr Dietary Recall First Meal: [place] + fries and sweet tea Snack:  Second Meal: baked chicken and mac n cheese and salad Snack:  Third Meal: hot dog and chili Snack:  Beverages: water, gingerale, sometimes sprite  Estimated Energy Needs Calories: *** Carbohydrate: ***g Protein: ***g Fat: ***g   NUTRITION DIAGNOSIS  {CHL AMB NUTRITIONAL [phone]}   NUTRITION INTERVENTION  Nutrition education (E-1) on the following topics:  ***  Handouts Provided Include  ***  Learning Style & Readiness for Change Teaching method utilized: Visual & Auditory  Demonstrated degree of understanding via: Teach Back  Barriers to learning/adherence to lifestyle change: ***  Goals Established by Pt Take 2 calcium daily at least 2 hours apart Take a vitamin D daily 2 packets  Pedialyte Breakfast: greek yogurt and banana; 3 hour later snack: crackers and peanut butter Lunch: whatever is at work Holiday representative: chicken or fish + canned vegetable like green beans + white rice Completely cut out FOR NOW: raw veggies, beef, fats food, hot dogs    MONITORING & EVALUATION Dietary intake, weekly physical activity  Next Steps  Patient is to ***.

## 2022-04-05 ENCOUNTER — Encounter (HOSPITAL_BASED_OUTPATIENT_CLINIC_OR_DEPARTMENT_OTHER): Payer: Self-pay

## 2022-04-06 ENCOUNTER — Other Ambulatory Visit (HOSPITAL_BASED_OUTPATIENT_CLINIC_OR_DEPARTMENT_OTHER): Payer: Self-pay | Admitting: *Deleted

## 2022-04-06 MED ORDER — FLUCONAZOLE 150 MG PO TABS: 150.0000 mg | ORAL_TABLET | Freq: Once | ORAL | 0 refills | Status: AC

## 2022-04-10 ENCOUNTER — Encounter (HOSPITAL_COMMUNITY): Payer: Self-pay

## 2022-04-10 ENCOUNTER — Ambulatory Visit (INDEPENDENT_AMBULATORY_CARE_PROVIDER_SITE_OTHER): Payer: Medicaid Other | Admitting: Clinical

## 2022-04-10 DIAGNOSIS — F0630 Mood disorder due to known physiological condition, unspecified: Secondary | ICD-10-CM | POA: Diagnosis not present

## 2022-04-10 DIAGNOSIS — K50919 Crohn's disease, unspecified, with unspecified complications: Secondary | ICD-10-CM

## 2022-04-10 NOTE — Plan of Care (Signed)
  Problem: Depression CCP Problem  1  Goal: LTG: Danali WILL SCORE LESS THAN 10 ON THE PATIENT HEALTH QUESTIONNAIRE (PHQ-9) Outcome: Not Progressing Goal: STG: Reduce overall depression score by a minimum of 25% on the Patient Health Questionnaire (PHQ-9) or the Montgomery-Asberg Depression Rating Scale (MADRS) Outcome: Not Progressing

## 2022-04-10 NOTE — Progress Notes (Signed)
THERAPIST PROGRESS NOTE  Session Time: 40 minutes  Participation Level: Active  Behavioral Response: CasualAlertDepressed  Type of Therapy: Individual Therapy  Treatment Goals addressed: Client will score less than a 10 on the PHQ9  ProgressTowards Goals: Progressing  Interventions: CBT and Supportive  Summary:  Lacey Perez is a 21 y.o. female who presents for the scheduled appointment oriented x5, appropriately dressed, and friendly.  Client denied hallucinations or delusions. Client reported on today she has been feeling stressed.  Client reported she is now back on duty with the [employer] and going through the process of being medically discharged. Client reported being back on duty has caused her to be on high alert and brought back negative emotions and thoughts from over a year ago. 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  Client reported she ended up having a accident on herself which she felt embarrassed about.  Client reported her liters have not been empathetic or understanding of her medical diagnosis and think that she is lying.  Client reported her group and interaction with it is mainly with meals and they make negative comments about her inability to perform the training or her as a person.  Client reported she is worried about having an accident while out in the field training or while they are at work.  Client reported when she goes to the bathroom it is a instant urge that she cannot hold off and in the process she is losing blood and also drinks her energy.  Client reported she continues to lose weight and cannot eat as she should because she does not want to upset her stomach.  Client reported she does have a nutritionist but feels like it is not helpful.  Client reported she has about 2 positive support but notes that majority of her friends and family  do not provide her with the support that she needs emotionally.  Client reported she has lost interest in going out with friends although she does want to see them. Evidence of progress towards goal: Client reported 1 cognitive patterns related to her health that contributes to her anxious/ depressive thoughts and behaviors.  Client's PHQ-9 score is a 16.  Flowsheet Row Counselor from 04/10/2022 in [facility]  PHQ-9 Total Score 16        Suicidal/Homicidal: Nowithout intent/plan  Therapist Response:  Therapist began the appointment asking the client how she has been doing since last seen. Therapist used CBT to engage using active listening and positive emotional support. Therapist used CBT and open-ended questions to allow the client time to discuss how her health diagnoses has negatively impacted her ability to interact socially or perform her job duties. Therapist used CBT to empathize and normalize the clients emotional response. Therapist used CBT to engage the client to discuss change talk and acceptance of her health condition to help improve self-confidence. Therapist used CBT ask the client to identify her progress with frequency of use with coping skills with continued practice in her daily activity.    Therapist assigned client homework to practice positive self talk and self-care.    Plan: Return again in 4 weeks.  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  Patient/Guardian was advised Release of Information must be obtained prior to any record release  in order to collaborate their care with an outside provider. Patient/Guardian was advised if they have not already done so to contact the registration department to sign all necessary forms in order for [place] to release information regarding their care.   Consent: Patient/Guardian gives  verbal consent for treatment and assignment of benefits for services provided during this visit. Patient/Guardian expressed understanding and agreed to proceed.   [place], LCSW 04/10/2022

## 2022-04-12 ENCOUNTER — Telehealth: Payer: Self-pay | Admitting: Gastroenterology

## 2022-04-12 DIAGNOSIS — K51919 Ulcerative colitis, unspecified with unspecified complications: Secondary | ICD-10-CM

## 2022-04-12 NOTE — Telephone Encounter (Signed)
Patient called states she is in severe pain. States she would like to speak to a nurse. Please call to advise.

## 2022-04-12 NOTE — Telephone Encounter (Signed)
Lacey Perez, She has not had any blood work or stool based testing done in the last few months. It is hard to know what is going on with her without knowing her from the past or anyone having evaluated her in months. I recommend the following: 1) CBC/CMP/ESR/CRP/amylase/lipase to be drawn 2) fecal calprotectin to be obtained 3) if diarrheal symptoms are occurring as well she should have a GI pathogen panel with C. difficile checked 4) if this is the same type of discomfort that she has previously had pain medications from Dr. Ardis Hughs, I am willing for a short 5-day course of tramadol to be available for her but we would need to have these labs back at a minimum before anything is ordered 5) get her set up with one of the APP's and I am happy to supervise 6) please find out what she is doing and how she is doing with Humira  Thanks. GM

## 2022-04-12 NOTE — Telephone Encounter (Signed)
Previous Lacey Perez pt with UC. Pt had been given hydrocodone in the past for pain by Dr. Ardis Hughs but there is a note stating that no mor refills would be given and pt was supposed to be seen by pain management clinic. She is calling stating she has been having abdominal pain in her stomach with pressure and it hurts when she walks. States this has been going on for about a week and she also has blood in her stool. She wanted to know if something could be sent in for her pain that is not as strong as the hydrocodone. Dr. Rush Landmark she is scheduled to see you in November. Please advise.

## 2022-04-13 ENCOUNTER — Other Ambulatory Visit (INDEPENDENT_AMBULATORY_CARE_PROVIDER_SITE_OTHER): Payer: Medicaid Other

## 2022-04-13 DIAGNOSIS — K51919 Ulcerative colitis, unspecified with unspecified complications: Secondary | ICD-10-CM | POA: Diagnosis not present

## 2022-04-13 LAB — COMPREHENSIVE METABOLIC PANEL
ALT: 10 U/L (ref 0–35)
AST: 11 U/L (ref 0–37)
Albumin: 4 g/dL (ref 3.5–5.2)
Alkaline Phosphatase: 38 U/L — ABNORMAL LOW (ref 39–117)
BUN: 6 mg/dL (ref 6–23)
CO2: 28 mEq/L (ref 19–32)
Calcium: 9.2 mg/dL (ref 8.4–10.5)
Chloride: 104 mEq/L (ref 96–112)
Creatinine, Ser: 0.7 mg/dL (ref 0.40–1.20)
GFR: 122.27 mL/min (ref >60.00)
Glucose, Bld: 76 mg/dL (ref 70–99)
Potassium: 4 mEq/L (ref 3.5–5.1)
Sodium: 140 mEq/L (ref 135–145)
Total Bilirubin: 0.2 mg/dL (ref 0.2–1.2)
Total Protein: 7.1 g/dL (ref 6.0–8.3)

## 2022-04-13 LAB — AMYLASE: Amylase: 59 U/L (ref 27–131)

## 2022-04-13 LAB — CBC WITH DIFFERENTIAL/PLATELET
Basophils Absolute: 0.1 10*3/uL (ref 0.0–0.1)
Basophils Relative: 0.8 % (ref 0.0–3.0)
Eosinophils Absolute: 0.4 10*3/uL (ref 0.0–0.7)
Eosinophils Relative: 5 % (ref 0.0–5.0)
HCT: 41.6 % (ref 36.0–46.0)
Hemoglobin: 13.7 g/dL (ref 12.0–15.0)
Lymphocytes Relative: 47.7 % — ABNORMAL HIGH (ref 12.0–46.0)
Lymphs Abs: 3.6 10*3/uL (ref 0.7–4.0)
MCHC: 32.9 g/dL (ref 30.0–36.0)
MCV: 101 fl — ABNORMAL HIGH (ref 78.0–100.0)
Monocytes Absolute: 1 10*3/uL (ref 0.1–1.0)
Monocytes Relative: 12.8 % — ABNORMAL HIGH (ref 3.0–12.0)
Neutro Abs: 2.6 10*3/uL (ref 1.4–7.7)
Neutrophils Relative %: 33.7 % — ABNORMAL LOW (ref 43.0–77.0)
Platelets: 319 10*3/uL (ref 150.0–400.0)
RBC: 4.12 Mil/uL (ref 3.87–5.11)
RDW: 13.4 % (ref 11.5–15.5)
WBC: 7.6 10*3/uL (ref 4.0–10.5)

## 2022-04-13 LAB — C-REACTIVE PROTEIN: CRP: 1.2 mg/dL (ref 0.5–20.0)

## 2022-04-13 LAB — SEDIMENTATION RATE: Sed Rate: 13 mm/hr (ref 0–20)

## 2022-04-13 LAB — LIPASE: Lipase: 17 U/L (ref 11.0–59.0)

## 2022-04-13 NOTE — Telephone Encounter (Signed)
Thanks for update. GM 

## 2022-04-13 NOTE — Telephone Encounter (Signed)
Pt aware of Dr. Donneta Romberg recommendations. Lab orders in epic, pt scheduled to see Ellouise Newer PA 05/08/22 at 1:30pm. Pt aware. Pt states she is also having some diarrhea. She reports Humira is going well for her. Dr. Rush Landmark aware.

## 2022-04-13 NOTE — Addendum Note (Signed)
Addended by: Rosanne Sack R on: 04/13/2022 08:50 AM   Modules accepted: Orders

## 2022-04-13 NOTE — Telephone Encounter (Signed)
Inbound call from patient mom. Advised her of recommendations. Patient mom understands and patient is going to have her labs done today. If needing to further advise, Please give a call back thank you .

## 2022-04-14 ENCOUNTER — Ambulatory Visit (INDEPENDENT_AMBULATORY_CARE_PROVIDER_SITE_OTHER)
Admission: RE | Admit: 2022-04-14 | Discharge: 2022-04-14 | Disposition: A | Discharge status: Home / Self Care | Payer: Medicaid Other | Source: Ambulatory Visit | Attending: Gastroenterology | Admitting: Gastroenterology

## 2022-04-14 ENCOUNTER — Other Ambulatory Visit: Payer: Self-pay

## 2022-04-14 ENCOUNTER — Encounter: Payer: Self-pay | Admitting: Gastroenterology

## 2022-04-14 ENCOUNTER — Other Ambulatory Visit (INDEPENDENT_AMBULATORY_CARE_PROVIDER_SITE_OTHER): Payer: Medicaid Other

## 2022-04-14 DIAGNOSIS — K51819 Other ulcerative colitis with unspecified complications: Secondary | ICD-10-CM

## 2022-04-14 DIAGNOSIS — K51919 Ulcerative colitis, unspecified with unspecified complications: Secondary | ICD-10-CM | POA: Diagnosis not present

## 2022-04-14 DIAGNOSIS — R1030 Lower abdominal pain, unspecified: Secondary | ICD-10-CM

## 2022-04-14 DIAGNOSIS — R109 Unspecified abdominal pain: Secondary | ICD-10-CM | POA: Diagnosis not present

## 2022-04-14 LAB — VITAMIN B12: Vitamin B-12: 560 pg/mL (ref 211–911)

## 2022-04-14 LAB — FOLATE: Folate: >23.9 ng/mL

## 2022-04-14 MED ORDER — TRAMADOL HCL 50 MG PO TABS: 50.0000 mg | ORAL_TABLET | Freq: Four times a day (QID) | ORAL | 0 refills | Status: DC | PRN

## 2022-04-14 NOTE — Telephone Encounter (Signed)
Inbound call from patient requesting to speak with a nurse in regards to labs she had drawn yesterday. Advised patient that provider is aware and will give her a call back at their earliest convenience. Patient advised understanding.  Thank you

## 2022-04-14 NOTE — Telephone Encounter (Signed)
This has been addressed. Pt came in for x-ray already

## 2022-04-14 NOTE — Telephone Encounter (Signed)
See my chart message

## 2022-04-14 NOTE — Telephone Encounter (Signed)
The elevated MCV is what we have discussed and a reason why we are getting folate and B12 to be checked.  This GIST suggest that the red blood cells are larger in size than typical red blood cells.  We are trying to exclude other reasons for this. I reviewed her KUB which is negative. The patient may be initiated on a short course of pain medication since this is the similar type of pain that she had previously when she had seen Dr. Ardis Hughs and the work-up up to this date has been negative.  I am only going to give her a short course because she has not been physically seen by me previously and does not have a actual provider visit until next month.  I will not be prescribing extra medication otherwise.  She can use Tylenol up to 3000 mg daily if needed for pain in addition to the tramadol 50 mg every 6 hours as needed.  I will put in a prescription and it will be sent.  Please update the patient Thanks. GM

## 2022-04-15 LAB — CLOSTRIDIUM DIFFICILE BY PCR: Toxigenic C. Difficile by PCR: NEGATIVE

## 2022-04-19 ENCOUNTER — Encounter: Payer: Self-pay | Admitting: Gastroenterology

## 2022-04-19 LAB — GI PROFILE, STOOL, PCR

## 2022-04-19 LAB — CALPROTECTIN, FECAL: Calprotectin, Fecal: 1120 ug/g — ABNORMAL HIGH (ref 0–120)

## 2022-04-20 ENCOUNTER — Telehealth: Payer: Self-pay | Admitting: Gastroenterology

## 2022-04-20 NOTE — Telephone Encounter (Signed)
Patient called requesting to ask to nurse, and is requesting a call back. Please advise.

## 2022-04-20 NOTE — Telephone Encounter (Signed)
Left message on machine to call back  

## 2022-04-20 NOTE — Telephone Encounter (Signed)
Patient's mother called states she would like to speak to a nurse regarding patient. Requesting a call on [phone].Please call to advise.

## 2022-04-20 NOTE — Telephone Encounter (Signed)
The pt wanted to know if she needs to change her med regimen in meantime while waiting for her 10/16 appt.  I did advise her not to make any changes until her appt.  The pt has been advised of the information and verbalized understanding.

## 2022-04-20 NOTE — Telephone Encounter (Signed)
The pt's mother called back to confirm that appt with Anderson Malta to discuss next steps.  She will come with her to the appt

## 2022-04-20 NOTE — Telephone Encounter (Signed)
See phone note dated 9/28.  

## 2022-04-25 ENCOUNTER — Ambulatory Visit: Payer: Medicaid Other | Admitting: Skilled Nursing Facility1

## 2022-05-04 ENCOUNTER — Ambulatory Visit: Payer: Medicaid Other | Admitting: Physician Assistant

## 2022-05-05 ENCOUNTER — Ambulatory Visit (HOSPITAL_COMMUNITY): Payer: Medicaid Other | Admitting: Clinical

## 2022-05-05 ENCOUNTER — Encounter (HOSPITAL_COMMUNITY): Payer: Self-pay

## 2022-05-08 ENCOUNTER — Encounter: Payer: Self-pay | Admitting: Physician Assistant

## 2022-05-08 ENCOUNTER — Ambulatory Visit: Payer: Medicaid Other | Admitting: Physician Assistant

## 2022-05-08 VITALS — BP 110/82 | HR 94 | Ht 66.0 in | Wt 137.0 lb

## 2022-05-08 DIAGNOSIS — K51919 Ulcerative colitis, unspecified with unspecified complications: Secondary | ICD-10-CM

## 2022-05-08 MED ORDER — PREDNISONE 10 MG PO TABS: ORAL_TABLET | ORAL | 0 refills | Status: AC

## 2022-05-08 MED ORDER — HYDROCODONE-ACETAMINOPHEN 5-325 MG PO TABS: 1.0000 | ORAL_TABLET | Freq: Four times a day (QID) | ORAL | 0 refills | Status: DC | PRN

## 2022-05-08 NOTE — Progress Notes (Signed)
Chief Complaint: Ulcerative colitis with diarrhea and abdominal pain  Review of pertinent gastrointestinal problems: 1.  Indeterminate colitis (left-sided segmental).  Diarrhea, minor bleeding led to colonoscopy 06/2021 terminal ileum not intubated due to tortuosity, no evidence of strictures or inflammation.  Right colon was normal, segmental inflammation in the left colon including distal rectum and also for a 20 cm segment in the sigmoid.  Multiple biopsies taken from throughout and path showed chronic inflammation in the left sided segmental changes.  Lialda 4 pills once daily started, also Rowasa enema once daily started.  CT enteroscopy 10/2021 "severe circumferential wall thickening and hyperenhancement involving the descending colon through the rectum." Labs 10/2021: Hepatitis B surface antigen negative, Quantifuron gold TB testing negative, hepatitis B surface Humira new start 12/2021:  2.  Acute norovirus infection 10/2021  HPI:    Lacey Perez is a 21 year old female with a past medical history of ulcerative colitis, known to Dr. Ardis Hughs (recently covered by Dr. Rush Landmark), who presents to clinic today with a complaint of abdominal pain and diarrhea in the setting of ulcerative colitis.    01/31/2022 patient seen in clinic by Dr. Ardis Hughs.  She was improving with recent start of Humira.  That time was discussed she is not responding very well to [facility] but she did have some clinical improvement.  She was instructed to have trough antibodies and drug level checked the day prior.  She was restarted on Lialda 4 pills once a day.  Also check TPMT enzyme activity.  GI path panel.  Also with a hemorrhoid at the time.    02/14/2022 CTAP with mild colitis involving the descending and sigmoid colon.  She was sent referral to a pain clinic as her inflammation of her colon was not clearly worse.    04/13/2022 fecal calprotectin elevated at 1120.  C. difficile and GI profile panel negative.  CBC  with a MCV of 101.  Folate and B12 normal.    04/14/2022 Dr. Rush Landmark noted that he was checking her B12 and folate.  Reviewed and negative KUB.  She was given a short course of pain meds Tylenol up to 3000 mg daily and Tramadol 50 mg every 6 hours.    Today, the patient tells me that she is frustrated because she does not feel like anyone is truly listening to her.  She explains that she has always had abdominal pain everywhere ever since she was diagnosed with UC in late December of last year.  This has never stopped.  She lives in constant pain some days worse than others. Rated as a constant 4-5/10 sometimes worsens to 10/10.  She has tried various pain medications including Tylenol and Tramadol as well as Aleve and none of these helped.  The only thing that helps his pain is Hydrocodone.  She tells me "I am not a pill popper", and prefer not to be on any medications, but does not want to live in pain.    Does tell me that she feels like the Humira has been helping her UC a little bit because "the blood stopped", but she still has 4-5 loose stools a day and generalized abdominal pain worse on the left side of her abdomen.  She was taking Lialda as well 4 caps a day but ran out a couple of months ago.  She is not sure that this was really making a big difference.  She has tried Dicyclomine in the past which makes her dizzy.    Denies  fever, chills, weight loss or blood in her stool.  Past Medical History:  Diagnosis Date   Asthma    UC (ulcerative colitis) ([place])     Past Surgical History:  Procedure Laterality Date   TONSILLECTOMY AND ADENOIDECTOMY     WISDOM TOOTH EXTRACTION      Current Outpatient Medications  Medication Sig Dispense Refill   acetaminophen (TYLENOL) 500 MG tablet Take 2,000 mg by mouth every 6 (six) hours as needed for mild pain.     Adalimumab (HUMIRA PEN) 40 MG/0.4ML PNKT Inject 1 pen. into the skin every 14 (fourteen) days. (Patient not taking: Reported on 04/03/2022) 2  each 6   Adalimumab (HUMIRA PEN-CD/UC/HS STARTER) 80 MG/0.8ML PNKT Inject 2 pens (160 mg total) under the skin on day 1, inject 1 pen (80 mg total) under the skin on day 15 3 each 0   azathioprine (IMURAN) 100 MG tablet Take 1.5 tablets (150 mg total) by mouth daily. 45 tablet 3   dicyclomine (BENTYL) 20 MG tablet Take 1 tablet (20 mg total) by mouth 3 (three) times daily as needed for spasms. 90 tablet 2   fluticasone (FLONASE) 50 MCG/ACT nasal spray Place 2 sprays into both nostrils daily. (Patient taking differently: Place 2 sprays into both nostrils daily as needed for allergies.) 16 g 0   mesalamine (LIALDA) 1.2 g EC tablet Take 4 tablets (4.8 g total) by mouth daily with breakfast. 30 tablet 11   metroNIDAZOLE (FLAGYL) 500 MG tablet Take 1 tablet (500 mg total) by mouth 2 (two) times daily. 14 tablet 0   Probiotic Product (PROBIOTIC PO) Take 1 capsule by mouth daily.     traMADol (ULTRAM) 50 MG tablet Take 1 tablet (50 mg total) by mouth every 6 (six) hours as needed for moderate pain. 30 tablet 0   No current facility-administered medications for this visit.    Allergies as of 05/08/2022   (No Known Allergies)    Family History  Problem Relation Age of Onset   Breast cancer Paternal Grandmother    Diabetes Paternal Grandmother    Colon polyps Maternal Grandmother    Asthma Father    Diabetes Mother    Hypertension Mother    Asthma Mother    Fibroids Mother    Asthma Sister    Colon cancer Neg Hx    Esophageal cancer Neg Hx    Rectal cancer Neg Hx    Stomach cancer Neg Hx     Social History   Socioeconomic History   Marital status: Single    Spouse name: Not on file   Number of children: 0   Years of education: Not on file   Highest education level: Not on file  Occupational History   Not on file  Tobacco Use   Smoking status: Never   Smokeless tobacco: Never  Vaping Use   Vaping Use: Every day   Substances: Nicotine, Flavoring  Substance and Sexual Activity    Alcohol use: Not Currently   Drug use: Never   Sexual activity: Yes    Birth control/protection: None  Other Topics Concern   Not on file  Social History Narrative   Not on file   Social Determinants of Health   Financial Resource Strain: Not on file  Food Insecurity: No Food Insecurity (12/07/2021)   Hunger Vital Sign    Worried About Running Out of Food in the Last Year: Never true    Ran Out of Food in the Last Year: Never true  Transportation Needs: No Transportation Needs (04/03/2022)   PRAPARE - Hydrologist (Medical): No    Lack of Transportation (Non-Medical): No  Physical Activity: Not on file  Stress: Stress Concern Present (02/08/2022)   [place]    Feeling of Stress : To some extent  Social Connections: Not on file  Intimate Partner Violence: Not on file    Review of Systems:    Constitutional: No weight loss, fever or chills Cardiovascular: No chest pain Respiratory: No SOB  Gastrointestinal: See HPI and otherwise negative   Physical Exam:  Vital signs: BP 110/82   Pulse 94   Ht 5\' 6"  (1.676 m)   Wt 137 lb (62.1 kg)   LMP 04/01/2022 (Exact Date)   BMI 22.11 kg/m    Constitutional:   Pleasant AA female appears to be in NAD, Well developed, Well nourished, alert and cooperative Respiratory: Respirations even and unlabored. Lungs clear to auscultation bilaterally.   No wheezes, crackles, or rhonchi.  Cardiovascular: Normal S1, S2. No MRG. Regular rate and rhythm. No peripheral edema, cyanosis or pallor.  Gastrointestinal:  Soft, nondistended, moderate LLQ and b/l Lower abdominal ttp,.  Increased bowel sounds all 4 quadrants. No appreciable masses or hepatomegaly. Rectal:  Not performed.  Psychiatric: Oriented to person, place and time. Demonstrates good judgement and reason without abnormal affect or behaviors.  RELEVANT LABS AND IMAGING: CBC    Component Value  Date/Time   WBC 7.6 04/13/2022 0929   RBC 4.12 04/13/2022 0929   HGB 13.7 04/13/2022 0929   HCT 41.6 04/13/2022 0929   PLT 319.0 04/13/2022 0929   MCV 101.0 (H) 04/13/2022 0929   MCH 34.7 (H) 05/27/2021 0820   MCHC 32.9 04/13/2022 0929   RDW 13.4 04/13/2022 0929   LYMPHSABS 3.6 04/13/2022 0929   MONOABS 1.0 04/13/2022 0929   EOSABS 0.4 04/13/2022 0929   BASOSABS 0.1 04/13/2022 0929    CMP     Component Value Date/Time   NA 140 04/13/2022 0929   K 4.0 04/13/2022 0929   CL 104 04/13/2022 0929   CO2 28 04/13/2022 0929   GLUCOSE 76 04/13/2022 0929   BUN 6 04/13/2022 0929   CREATININE 0.70 04/13/2022 0929   CALCIUM 9.2 04/13/2022 0929   PROT 7.1 04/13/2022 0929   ALBUMIN 4.0 04/13/2022 0929   AST 11 04/13/2022 0929   ALT 10 04/13/2022 0929   ALKPHOS 38 (L) 04/13/2022 0929   BILITOT 0.2 04/13/2022 0929   GFRNONAA >60 05/27/2021 0820    Assessment: 1.  Ulcerative colitis: Diagnosed December 2022, initially started on Lialda which did not control her symptoms, changed to Humira over the past 4 to 5 months which has controlled the bleeding but patient continues with loose stools and generalized abdominal pain requiring opioids, recent fecal calprotectin still severely elevated, trough and antibodies were checked for Humira and normal/showed medication was working  Plan: 1.  Patient would prefer not to be changed from Humira if she does not need to.  Due to this will start her on a Prednisone taper 40 mg once daily x2 weeks, tapering down by 5 mg/week until she is done. 2.  Patient wanted a note not to get her flu vaccine.  Discussed that it is actually more important for her to get the flu vaccine since she is immunocompromised.  She verbalized understanding. 3.  Refilled her Hydrocodone one time.  Discussed that we cannot do this forever.  The  goal will be to get her ulcerative colitis under control so she does not require pain medication.  She verbalized understanding. 4.  If  patient does not respond to Prednisone would recommend repeating colonoscopy and discussing change in therapy. 5.  Patient would like to stay with Dr. Rush Landmark as she has been getting direction through him recently. She will follow with me in 3-4 weeks.  Ellouise Newer, PA-C [facility] 05/08/2022, 1:38 PM  Cc: [facility]*

## 2022-05-08 NOTE — Patient Instructions (Signed)
We have sent the following medications to your pharmacy for you to pick up at your convenience: Prednisone taper and hydrocodone  Due to recent changes in healthcare laws, you may see the results of your imaging and laboratory studies on MyChart before your provider has had a chance to review them.  We understand that in some cases there may be results that are confusing or concerning to you. Not all laboratory results come back in the same time frame and the provider may be waiting for multiple results in order to interpret others.  Please give [place] 48 hours in order for your provider to thoroughly review all the results before contacting the office for clarification of your results.   I appreciate the opportunity to care for you. Ellouise Newer PA-C

## 2022-05-09 NOTE — Progress Notes (Signed)
Attending Physician's Attestation   I have reviewed the chart.   I agree with the Advanced Practitioner's note, impression, and recommendations with any updates as below. Pending how the patient does overall, I suspect that steroids will not be enough with the high fecal calprotectin, but we can certainly see.  If fecal calprotectin remains elevated, will need to think about updating colonoscopy to document if this is effective or not and also updated cross-sectional imaging.   Justice Britain, MD [facility] Advanced Endoscopy Office # [phone]

## 2022-05-29 ENCOUNTER — Ambulatory Visit: Payer: Medicaid Other | Admitting: Skilled Nursing Facility1

## 2022-05-30 ENCOUNTER — Ambulatory Visit: Payer: Medicaid Other | Admitting: Gastroenterology

## 2022-05-31 ENCOUNTER — Telehealth: Payer: Self-pay | Admitting: Pharmacy Technician

## 2022-05-31 ENCOUNTER — Other Ambulatory Visit (HOSPITAL_COMMUNITY): Payer: Self-pay

## 2022-05-31 NOTE — Telephone Encounter (Signed)
Patient Advocate Encounter  Received notification from [employer] that prior authorization for MESALIMINE 1.2 GM  is required.   PA is not needed.   PA NOT submitted on 11.8.23 Key [id number] Status is pending    Ricke Hey, CPhT Patient Advocate Phone: [phone]

## 2022-06-02 DIAGNOSIS — Z79899 Other long term (current) drug therapy: Secondary | ICD-10-CM | POA: Diagnosis not present

## 2022-06-05 ENCOUNTER — Encounter: Payer: Self-pay | Admitting: Skilled Nursing Facility1

## 2022-06-05 ENCOUNTER — Other Ambulatory Visit: Payer: Medicaid Other | Admitting: *Deleted

## 2022-06-05 ENCOUNTER — Encounter: Payer: Self-pay | Admitting: *Deleted

## 2022-06-05 ENCOUNTER — Encounter: Payer: Medicaid Other | Attending: Gastroenterology | Admitting: Skilled Nursing Facility1

## 2022-06-05 VITALS — Ht 66.0 in | Wt 138.0 lb

## 2022-06-05 DIAGNOSIS — K51919 Ulcerative colitis, unspecified with unspecified complications: Secondary | ICD-10-CM | POA: Insufficient documentation

## 2022-06-05 DIAGNOSIS — B009 Herpesviral infection, unspecified: Secondary | ICD-10-CM | POA: Diagnosis not present

## 2022-06-05 NOTE — Progress Notes (Incomplete)
Medical Nutrition Therapy   Primary concerns today: UC  Referral diagnosis: k51.91 Preferred learning style: auditory, visual Learning readiness: change in progress   NUTRITION ASSESSMENT    Clinical Medical Hx: UC Medications: see list Labs: WNL Previous Notable Signs/Symptoms: Bloated, diarrhea, blood in stool, headaches 4 times a week  Lifestyle & Dietary Hx  Pt arrives doing fantastic!   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   Pt states she has been feeling well the last few weeks but then has had a lot of diarrhea due to feeling free from pain and thinking she could eat dairy which now she is seeing was not good for her gut.  Pt states she has been cooking more at home and sees where this is really helping her to feel well.  Pt states she did start taking a multivitamin.  Pt states since making dietary changes she has felt totally well and normal.   Pt states she tried raw vegetables which did not go well.  Pt wants to know what do eat in a flare.    Estimated daily fluid intake: 60+ oz Supplements: woman's multivitamin, tumeric, Oil of oregeno, tumeric, multivmtina, calcium, good belly probiotic drink Sleep: wakes every 2 hours to diarrhea in the night Stress / self-care: fair Current average weekly physical activity: walking daily because of work  24-Hr Dietary Recall First Meal: 2 scrambled eggs in can't believe its not butter + 1 frozen waffle  + syrup Snack:  Second Meal: baked french fries at work or baked chicken sandwich  Snack: crackers or drinkable probiotic yogurt  Third Meal: rice + chicken + cooked vegetables  Snack:  Beverages: water, gingerale, pedialyte packets, karma water   NUTRITION INTERVENTION  Nutrition education (E-1) on the following topics: Continued Processed Food and UC Eating throughout the day and UC In a flavor dietary choices Eating in a flare Adequate protein consumption  Handouts Previously Provided Include   UC recommended and not recommended foods   Learning Style & Readiness for Change Teaching method utilized: Visual & Auditory  Demonstrated degree of understanding via: Teach Back  Barriers to learning/adherence to lifestyle change: GI disturbances   Goals Established by Pt Fantastic job on correcting your diet! Be sure to still eat when in a flare just no raw and no risks  Pedialyte packet for when in a flare Get a thiamine for when in a flare Be sure to always have protein with lunch  MONITORING & EVALUATION Dietary intake, weekly physical activity  Next Steps  Patient is to follow up as needed: please call or email with any future questions or concerns

## 2022-06-05 NOTE — Patient Instructions (Signed)
Visit Information  Lacey Perez was given information about Medicaid Managed Care team care coordination services as a part of their Healthy [facility] Medicaid benefit. Lacey Perez verbally consented to engagement with the [facility] Managed Care team.   If you are experiencing a medical emergency, please call 911 or report to your local emergency department or urgent care.   If you have a non-emergency medical problem during routine business hours, please contact your provider's office and ask to speak with a nurse.   For questions related to your Healthy [facility] health plan, please call: [phone] or visit the homepage here: [email or web address]  If you would like to schedule transportation through your Healthy [facility] plan, please call the following number at least 2 days in advance of your appointment: [phone]  For information about your ride after you set it up, call Ride Assist at [phone]. Use this number to activate a Will Call pickup, or if your transportation is late for a scheduled pickup. Use this number, too, if you need to make a change or cancel a previously scheduled reservation.  If you need transportation services right away, call [phone]. The after-hours call center is staffed 24 hours to handle ride assistance and urgent reservation requests (including discharges) 365 days a year. Urgent trips include sick visits, hospital discharge requests and life-sustaining treatment.  Call the [place] at [phone], at any time, 24 hours a day, 7 days a week. If you are in danger or need immediate medical attention call 911.  If you would like help to quit smoking, call 1-800-QUIT-NOW 437 552 3159) OR Espaol: 1-855-Djelo-Ya QO:409462) o para ms informacin haga clic aqu or Text READY to 200-400 to register via text  Lacey Perez,   Please see education materials related to [facility]  colitis provided by MyChart link.  Patient verbalizes understanding of instructions and care plan provided today and agrees to view in [place]. Active MyChart status and patient understanding of how to access instructions and care plan via MyChart confirmed with patient.     Telephone follow up appointment with Managed Medicaid care management team member scheduled for:08/07/22 @ 3:30pm  Lacey Joiner RN, BSN [place] RN Care Coordinator   Following is a copy of your plan of care:  Care Plan : RN Care Manager Plan of Care  Updates made by Lacey Montane, RN since 06/05/2022 12:00 AM     Problem: Health Management needs related to Ulcerative Colitis      Long-Range Goal: Development of Plan of Care to address Health Management needs related to Ulcerative Colitis   Start Date: 12/07/2021  Expected End Date: 08/07/2022  Priority: High  Note:   Current Barriers:  Chronic Disease Management support and education needs related to Ulcerative Colitis  Lacey Perez is on a steroid taper and reporting solid stools. Plans to discuss what to eat during a flare up with Nutrition today.  RNCM Clinical Goal(s):  Patient will verbalize understanding of plan for management of Ulcerative Colitis as evidenced by patient reports take all medications exactly as prescribed and will call provider for medication related questions as evidenced by patient reports and documentation in EMR    attend all scheduled medical appointments: 06/05/22 with Nutrition, 06/08/22 with GI  and scheduling follow up with [facility] as evidenced by provider documentation in EMR        work with social worker to address [place] Concerns  related to the management of Ulcerative Colitis as evidenced  by review of EMR and patient or social worker report     through collaboration with Medical illustrator, provider, and care team.   Interventions: Inter-disciplinary care team collaboration (see longitudinal  plan of care) Evaluation of current treatment plan related to  self management and patient's adherence to plan as established by provider  Ulcerative Colitis  (Status: Goal on Track (progressing): YES.) Long Term Goal  Evaluation of current treatment plan related to  Ulcerative Colitis ,  self-management and patient's adherence to plan as established by provider. Discussed plans with patient for ongoing care management follow up and provided patient with direct contact information for care management team Advised patient to journal her symptoms and pain to track triggers; Reviewed medications with patient and discussed humira and its side effects; Reviewed scheduled/upcoming provider appointments including 06/05/22 with Nutrition, 06/08/22 with GI and scheduling follow up with [facility] [place]; Provided therapeutic listening  Patient Goals/Self-Care Activities: Take medications as prescribed   Attend all scheduled provider appointments Call provider office for new concerns or questions  Work with LCSW for managing stress

## 2022-06-05 NOTE — Patient Outreach (Signed)
Medicaid Managed Care   Nurse Care Manager Note  06/05/2022 Name:  Lacey Perez MRN:  [id number] DOB:  Sep 19, 2000  Lacey Perez is an 21 y.o. year old female who is a primary patient of [facility]  The [facility] Managed Care Coordination team was consulted for assistance with:    Lacey Colitis  [name] was given information about Medicaid Managed Care Coordination team services today. Lacey Perez Patient agreed to services and verbal consent obtained.  Engaged with patient by telephone for follow up visit in response to provider referral for case management and/or care coordination services.   Assessments/Interventions:  Review of past medical history, allergies, medications, health status, including review of consultants reports, laboratory and other test data, was performed as part of comprehensive evaluation and provision of chronic care management services.  SDOH (Social Determinants of Health) assessments and interventions performed: SDOH Interventions    Flowsheet Row Counselor from 04/10/2022 in [facility] Patient Outreach Telephone from 04/03/2022 in [facility] Care Coordination Patient Outreach Telephone from 02/08/2022 in [facility] Care Coordination Patient Outreach Telephone from 12/27/2021 in [facility] Coordination Patient Outreach Telephone from 12/13/2021 in [facility] Coordination Patient Outreach Telephone from 12/07/2021 in [facility] Care Coordination  SDOH Interventions        Food Insecurity Interventions -- -- -- -- -- Intervention Not Indicated  Housing Interventions -- Intervention Not Indicated -- -- -- Intervention Not Indicated  Transportation Interventions -- Intervention Not Indicated -- -- -- Intervention Not Indicated  Utilities Interventions -- Intervention Not  Indicated -- -- -- --  Depression Interventions/Treatment  Currently on Treatment -- -- -- -- --  Stress Interventions -- -- [employer] Resources Offered [employer], Provide Counseling Offered [employer] Resources, Provide Counseling --       Care Plan  No Known Allergies  Medications Reviewed Today     Reviewed by Heidi Dach, RN (Registered Nurse) on 06/05/22 at 1548  Med List Status: <None>   Medication Order Taking? Sig Documenting Provider Last Dose Status Informant  acetaminophen (TYLENOL) 500 MG tablet [id number] Yes Take 2,000 mg by mouth every 6 (six) hours as needed for mild pain. [provider] Taking Active Self  Adalimumab (HUMIRA PEN) 40 MG/0.4ML PNKT [id number] Yes Inject 1 pen. into the skin every 14 (fourteen) days. Lacey Fee, MD Taking Active            Med Note (Deland Slocumb A   Thu Feb 02, 2022 10:27 AM)    Adalimumab [facility] PEN-CD/UC/HS STARTER) 80 MG/0.8ML PNKT [id number] No Inject 2 pens (160 mg total) under the skin on day 1, inject 1 pen (80 mg total) under the skin on day 15  Patient not taking: Reported on 05/08/2022   Lacey Fee, MD Not Taking Active            Med Note [name] Feb 02, 2022 10:28 AM) Completed starter    Discontinued 10/11/19 1240 azathioprine (IMURAN) 100 MG tablet [id number] Yes Take 1.5 tablets (150 mg total) by mouth daily. Lacey Fee, MD Taking Active   dicyclomine (BENTYL) 20 MG tablet [id number] No Take 1 tablet (20 mg total) by mouth 3 (three) times daily as needed for spasms.  Patient not taking: Reported on 06/05/2022   Lacey Albee, PA-C Not Taking Expired 04/03/22 2359   fluticasone (FLONASE) 50  MCG/ACT nasal spray [id number] Yes Place 2 sprays into both nostrils daily.  Patient taking differently: Place 2 sprays into both nostrils daily as needed for allergies.   [name], MD Taking Active     Discontinued 10/11/19 1240  HYDROcodone-acetaminophen (NORCO/VICODIN) 5-325 MG tablet [id number] No Take 1 tablet by mouth every 6 (six) hours as needed for moderate pain.  Patient not taking: Reported on 06/05/2022   Unk Lacey Perez, [place] Not Taking Active     Discontinued 06/13/20 1454   mesalamine (LIALDA) 1.2 g EC tablet [id number] Yes Take 4 tablets (4.8 g total) by mouth daily with breakfast. Lacey Fee, MD Taking Active   metroNIDAZOLE (FLAGYL) 500 MG tablet [id number] No Take 1 tablet (500 mg total) by mouth 2 (two) times daily.  Patient not taking: Reported on 06/05/2022   Lacey Lowenstein, PA-C Not Taking Active            Med Note Ardelia Mems, Lacey Perez A   Mon Jun 05, 2022  3:48 PM) completed    Discontinued 10/11/19 1240 predniSONE (DELTASONE) 10 MG tablet [id number] Yes Take 4 tablets (40 mg total) by mouth daily with breakfast for 14 days, THEN 3.5 tablets (35 mg total) daily with breakfast for 7 days, THEN 3 tablets (30 mg total) daily with breakfast for 7 days, THEN 2.5 tablets (25 mg total) daily with breakfast for 7 days, THEN 2 tablets (20 mg total) daily with breakfast for 7 days, THEN 1.5 tablets (15 mg total) daily with breakfast for 7 days, THEN 1 tablet (10 mg total) daily with breakfast for 7 days, THEN 0.5 tablets (5 mg total) daily with breakfast for 7 days. Stop after doing 5mg  for a week. , PA Taking Active   Probiotic Product (PROBIOTIC PO) Unk Lacey Perez Yes Take 1 capsule by mouth daily. [provider] Taking Active Self  traMADol (ULTRAM) 50 MG tablet [id number] No Take 1 tablet (50 mg total) by mouth every 6 (six) hours as needed for moderate pain.  Patient not taking: Reported on 06/05/2022   [name], 06/07/2022., MD Not Taking Active             There are no problems to display for this patient.   Conditions to be addressed/monitored per PCP order:   Ulcerative Perez  Care Plan : RN Care Manager Plan of Care  Updates made by Netty Starring, RN since  06/05/2022 12:00 AM     Problem: Health Management needs related to Ulcerative Perez      Long-Range Goal: Development of Plan of Care to address Health Management needs related to Ulcerative Perez   Start Date: 12/07/2021  Expected End Date: 08/07/2022  Priority: High  Note:   Current Barriers:  Chronic Disease Management support and education needs related to Ulcerative Colitis  Ms. Knust is on a steroid taper and reporting solid stools. Plans to discuss what to eat during a flare up with Nutrition today.  RNCM Clinical Goal(s):  Patient will verbalize understanding of plan for management of Ulcerative Perez as evidenced by patient reports take all medications exactly as prescribed and will call provider for medication related questions as evidenced by patient reports and documentation in EMR    attend all scheduled medical appointments: 06/05/22 with Nutrition, 06/08/22 with GI  and scheduling follow up with [facility] as evidenced by provider documentation in EMR        work with social worker to address Mental Health Concerns  related to the management  of Ulcerative Perez as evidenced by review of EMR and patient or social worker report     through collaboration with Medical illustrator, provider, and care team.   Interventions: Inter-disciplinary care team collaboration (see longitudinal plan of care) Evaluation of current treatment plan related to  self management and patient's adherence to plan as established by provider  Ulcerative Perez  (Status: Goal on Track (progressing): YES.) Long Term Goal  Evaluation of current treatment plan related to  Ulcerative Perez ,  self-management and patient's adherence to plan as established by provider. Discussed plans with patient for ongoing care management follow up and provided patient with direct contact information for care management team Advised patient to journal her symptoms and pain to track triggers; Reviewed medications with  patient and discussed humira and its side effects; Reviewed scheduled/upcoming provider appointments including 06/05/22 with Nutrition, 06/08/22 with GI and scheduling follow up with [facility]; Provided therapeutic listening  Patient Goals/Self-Care Activities: Take medications as prescribed   Attend all scheduled provider appointments Call provider office for new concerns or questions  Work with LCSW for managing stress       Follow Up:  Patient agrees to Care Plan and Follow-up.  Plan: The Managed Medicaid care management team will reach out to the patient again over the next 60 days.  Date/time of next scheduled RN care management/care coordination outreach:  08/07/21 @ 3:30pm  Estanislado Emms RN, BSN   [facility] RN Care Coordinator

## 2022-06-08 ENCOUNTER — Ambulatory Visit (INDEPENDENT_AMBULATORY_CARE_PROVIDER_SITE_OTHER): Payer: Medicaid Other | Admitting: Physician Assistant

## 2022-06-08 ENCOUNTER — Encounter: Payer: Self-pay | Admitting: Physician Assistant

## 2022-06-08 VITALS — BP 118/68 | HR 94 | Ht 66.0 in | Wt 136.4 lb

## 2022-06-08 DIAGNOSIS — K51919 Ulcerative colitis, unspecified with unspecified complications: Secondary | ICD-10-CM

## 2022-06-08 MED ORDER — MESALAMINE 1.2 G PO TBEC: 4.8000 g | DELAYED_RELEASE_TABLET | Freq: Every day | ORAL | 11 refills | Status: DC

## 2022-06-08 NOTE — Patient Instructions (Signed)
_______________________________________________________  If you are age 23 or older, your body mass index should be between 23-30. Your Body mass index is 22.01 kg/m. If this is out of the aforementioned range listed, please consider follow up with your Primary Care Provider.  If you are age 78 or younger, your body mass index should be between 19-25. Your Body mass index is 22.01 kg/m. If this is out of the aformentioned range listed, please consider follow up with your Primary Care Provider.   Continue Prednisone taper.  The [place] [facility] providers would like to encourage you to use [facility] to communicate with providers for non-urgent requests or questions.  Due to long hold times on the telephone, sending your provider a message by [facility] may be a faster and more efficient way to get a response.  Please allow 48 business hours for a response.  Please remember that this is for non-urgent requests.   It was a pleasure to see you today!  Thank you for trusting me with your gastrointestinal care!

## 2022-06-08 NOTE — Progress Notes (Signed)
Attending Physician's Attestation   I have reviewed the chart.   I agree with the Advanced Practitioner's note, impression, and recommendations with any updates as below. I have some concern that if the patient does try to get off Humira and then have to go back on it she may develop antibodies.  Certainly the patient has a right to decide what ever medications she wants to be on.  Sounds like she probably needs an updated colonoscopic evaluation but we cannot force the patient either.  She will need to taper as I am not likely to continue a long-term steroid use for this individual at her particular age unless we have active evidence of inflammation on fecal calprotectin and inflammatory markers and/or colonoscopy.   Corliss Parish, MD [facility] Advanced Endoscopy Office # [phone]

## 2022-06-08 NOTE — Progress Notes (Signed)
Chief Complaint: Follow-up ulcerative colitis  Review of pertinent gastrointestinal problems: 1.  Indeterminate colitis (left-sided segmental).  Diarrhea, minor bleeding led to colonoscopy 06/2021 terminal ileum not intubated due to tortuosity, no evidence of strictures or inflammation.  Right colon was normal, segmental inflammation in the left colon including distal rectum and also for a 20 cm segment in the sigmoid.  Multiple biopsies taken from throughout and path showed chronic inflammation in the left sided segmental changes.  Lialda 4 pills once daily started, also Rowasa enema once daily started.  CT enteroscopy 10/2021 "severe circumferential wall thickening and hyperenhancement involving the descending colon through the rectum." Labs 10/2021: Hepatitis B surface antigen negative, Quantifuron gold TB testing negative, hepatitis B surface Humira new start 12/2021:  2.  Acute norovirus infection 10/2021  HPI:    [name] is a 21 year old female with a past medical history of ulcerative colitis, known to Dr. Ardis Hughs (covered by Dr. Rush Landmark), who presents to clinic today for follow-up of her ulcerative colitis flare.    01/31/2022 patient seen in clinic by Dr. Ardis Hughs.  She was improving with recent start of Humira.  That time was discussed she is not responding very well to [facility] but she did have some clinical improvement.  She was instructed to have trough antibodies and drug level checked the day prior.  She was restarted on Lialda 4 pills once a day.  Also check TPMT enzyme activity.  GI path panel.  Also with a hemorrhoid at the time.    02/14/2022 CTAP with mild colitis involving the descending and sigmoid colon.  She was sent referral to a pain clinic as her inflammation of her colon was not clearly worse.    04/13/2022 fecal calprotectin elevated at 1120.  C. difficile and GI profile panel negative.  CBC with a MCV of 101.  Folate and B12 normal.    04/14/2022 Dr. Rush Landmark  noted that he was checking her B12 and folate.  Reviewed and negative KUB.  She was given a short course of pain meds Tylenol up to 3000 mg daily and Tramadol 50 mg every 6 hours.    05/08/2022 patient presented to clinic and described that she felt like no one is truly listening to her.  She continued with abdominal pain since her initial diagnosis of UC in late December 2022.  Felt like the Humira was helping her UC a little bit because the blood had stopped.  Still 4-5 loose stools a day and generalized abdominal pain taking Lialda as well 4 caps a day but did run out.  At that point patient preferred not to change the Humira.  She was started on Prednisone taper 40 mg once a day for 2 weeks tapering down by 5 mg/week until she was done.  Discussed getting her flu vaccine.  Refilled her Hydrocodone 1 time.  Discussed repeating colonoscopy she did not respond to the Prednisone.  Dr. Rush Landmark recommended recheck a fecal calprotectin, possibly checking CTAP with contrast and repeating colonoscopy if needed.    Today, patient explains that she is feeling very good on the Prednisone.  She is still on 35 mg once a day and tapering by 5 mg every week.  Tells me her generalized abdominal pain is for the most part controlled and her bowel movements are down to 1-2 a day which are soft solid.  She is still been using her Humira but has not been taking the Lialda because we were not able to get  this refilled for her yet.  She tells me that in truth that she was not taking the Lialda as she should and often skip days because they were only giving her 30 pills at a time when this was her solo therapy.  She wonders if she were using this medication correctly if she could come off of the Humira because she has been fighting various infections here and there since starting it.  Currently feels like she may have a sinus infection with lingering congestion over the past couple of weeks.  She has not seen her PCP about this  yet.  Does tell me that she had some diarrhea couple of days ago and used a suppository that she had leftover worked worked well for to control symptoms.    Did get her flu vaccine. 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    Denies fever, chills, nausea, vomiting, continued abdominal pain or blood in her stools.  Past Medical History:  Diagnosis Date   Asthma    UC (ulcerative colitis) ([place])     Past Surgical History:  Procedure Laterality Date   TONSILLECTOMY AND ADENOIDECTOMY     WISDOM TOOTH EXTRACTION      Current Outpatient Medications  Medication Sig Dispense Refill   acetaminophen (TYLENOL) 500 MG tablet Take 2,000 mg by mouth every 6 (six) hours as needed for mild pain.     Adalimumab (HUMIRA PEN) 40 MG/0.4ML PNKT Inject 1 pen. into the skin every 14 (fourteen) days. 2 each 6   Adalimumab (HUMIRA PEN-CD/UC/HS STARTER) 80 MG/0.8ML PNKT Inject 2 pens (160 mg total) under the skin on day 1, inject 1 pen (80 mg total) under the skin on day 15 (Patient not taking: Reported on 05/08/2022) 3 each 0   azathioprine (IMURAN) 100 MG tablet Take 1.5 tablets (150 mg total) by mouth daily. 45 tablet 3   dicyclomine (BENTYL) 20 MG tablet Take 1 tablet (20 mg total) by mouth 3 (three) times daily as needed for spasms. (Patient not taking: Reported on 06/05/2022) 90 tablet 2   fluticasone (FLONASE) 50 MCG/ACT nasal spray Place 2 sprays into both nostrils daily. (Patient taking differently: Place 2 sprays into both nostrils daily as needed for allergies.) 16 g 0   HYDROcodone-acetaminophen (NORCO/VICODIN) 5-325 MG tablet Take 1 tablet by mouth every 6 (six) hours as needed for moderate pain. (Patient not taking: Reported on 06/05/2022) 30 tablet 0   mesalamine (LIALDA) 1.2 g EC tablet Take 4 tablets (4.8 g total) by mouth daily with breakfast. 30 tablet 11   metroNIDAZOLE (FLAGYL) 500 MG tablet Take 1 tablet (500 mg total) by mouth 2 (two) times daily. (Patient not taking: Reported on 06/05/2022) 14 tablet  0   predniSONE (DELTASONE) 10 MG tablet Take 4 tablets (40 mg total) by mouth daily with breakfast for 14 days, THEN 3.5 tablets (35 mg total) daily with breakfast for 7 days, THEN 3 tablets (30 mg total) daily with breakfast for 7 days, THEN 2.5 tablets (25 mg total) daily with breakfast for 7 days, THEN 2 tablets (20 mg total) daily with breakfast for 7 days, THEN 1.5 tablets (15 mg total) daily with breakfast for 7 days, THEN 1 tablet (10 mg total) daily with breakfast for 7 days, THEN 0.5 tablets (5 mg total) daily with breakfast for 7 days. Stop after doing 5mg  for a week.. 100 tablet 0   Probiotic Product (PROBIOTIC PO) Take 1 capsule by mouth daily.     traMADol (ULTRAM) 50 MG tablet  Take 1 tablet (50 mg total) by mouth every 6 (six) hours as needed for moderate pain. (Patient not taking: Reported on 06/05/2022) 30 tablet 0   No current facility-administered medications for this visit.    Allergies as of 06/08/2022   (No Known Allergies)    Family History  Problem Relation Age of Onset   Breast cancer Paternal Grandmother    Diabetes Paternal Grandmother    Colon polyps Maternal Grandmother    Asthma Father    Diabetes Mother    Hypertension Mother    Asthma Mother    Fibroids Mother    Asthma Sister    Colon cancer Neg Hx    Esophageal cancer Neg Hx    Rectal cancer Neg Hx    Stomach cancer Neg Hx     Social History   Socioeconomic History   Marital status: Single    Spouse name: Not on file   Number of children: 0   Years of education: Not on file   Highest education level: Not on file  Occupational History   Not on file  Tobacco Use   Smoking status: Never   Smokeless tobacco: Never  Vaping Use   Vaping Use: Every day   Substances: Nicotine, Flavoring  Substance and Sexual Activity   Alcohol use: Not Currently   Drug use: Never   Sexual activity: Yes    Birth control/protection: None  Other Topics Concern   Not on file  Social History Narrative   Not on  file   Social Determinants of Health   Financial Resource Strain: Not on file  Food Insecurity: No Food Insecurity (12/07/2021)   Hunger Vital Sign    Worried About Running Out of Food in the Last Year: Never true    Ran Out of Food in the Last Year: Never true  Transportation Needs: No Transportation Needs (04/03/2022)   PRAPARE - Hydrologist (Medical): No    Lack of Transportation (Non-Medical): No  Physical Activity: Not on file  Stress: Stress Concern Present (02/08/2022)   [facility]    Feeling of Stress : To some extent  Social Connections: Not on file  Intimate Partner Violence: Not on file    Review of Systems:    Constitutional: No weight loss, fever or chills Cardiovascular: No chest pain  Respiratory: No SOB  Gastrointestinal: See HPI and otherwise negative   Physical Exam:  Vital signs: BP 118/68 (BP Location: Left Arm, Patient Position: Sitting, Cuff Size: Normal)   Pulse 94   Ht 5\' 6"  (1.676 m)   Wt 136 lb 6 oz (61.9 kg)   SpO2 97%   BMI 22.01 kg/m    Constitutional:   Pleasant AA female appears to be in NAD, Well developed, Well nourished, alert and cooperative Respiratory: Respirations even and unlabored. Lungs clear to auscultation bilaterally.   No wheezes, crackles, or rhonchi.  Cardiovascular: Normal S1, S2. No MRG. Regular rate and rhythm. No peripheral edema, cyanosis or pallor.  Gastrointestinal:  Soft, nondistended, nontender. No rebound or guarding. Normal bowel sounds. No appreciable masses or hepatomegaly. Rectal:  Not performed.  Psychiatric: Demonstrates good judgement and reason without abnormal affect or behaviors.  RELEVANT LABS AND IMAGING: CBC    Component Value Date/Time   WBC 7.6 04/13/2022 0929   RBC 4.12 04/13/2022 0929   HGB 13.7 04/13/2022 0929   HCT 41.6 04/13/2022 0929   PLT 319.0 04/13/2022 0929  MCV 101.0 (H) 04/13/2022 0929    MCH 34.7 (H) 05/27/2021 0820   MCHC 32.9 04/13/2022 0929   RDW 13.4 04/13/2022 0929   LYMPHSABS 3.6 04/13/2022 0929   MONOABS 1.0 04/13/2022 0929   EOSABS 0.4 04/13/2022 0929   BASOSABS 0.1 04/13/2022 0929    CMP     Component Value Date/Time   NA 140 04/13/2022 0929   K 4.0 04/13/2022 0929   CL 104 04/13/2022 0929   CO2 28 04/13/2022 0929   GLUCOSE 76 04/13/2022 0929   BUN 6 04/13/2022 0929   CREATININE 0.70 04/13/2022 0929   CALCIUM 9.2 04/13/2022 0929   PROT 7.1 04/13/2022 0929   ALBUMIN 4.0 04/13/2022 0929   AST 11 04/13/2022 0929   ALT 10 04/13/2022 0929   ALKPHOS 38 (L) 04/13/2022 0929   BILITOT 0.2 04/13/2022 0929   GFRNONAA >60 05/27/2021 0820    Assessment: 1.  Ulcerative colitis with flare: Under better control on Prednisone taper, currently 35 mg a day tapering down by 5 mg a week and also on her Humira, was supposed to be on Lialda as well but was not taking this medication as it was hard for [place] to get filled  Plan: 1.  Patient was wanting to go back on just the Lialda 4 caps a day to see if this would actually help her if she were using it correctly instead of staying on Humira.  I discussed this was not the best idea because typically when we increase therapy patients are unable to go backwards.  She tells me she is still going to get the Humira sent to her house but she is going to do an experiment on her own where she just uses the Lialda 4 caps a day. 2.  Continue Prednisone taper 3.  Refilled Lialda 4.8 g daily #120 with 3 refills 4.  Patient was scheduled to follow-up in 3 to 4 months with me.  We will see how she is doing then.  Did discuss that if she has another flare would recommend that we repeat a colonoscopy.  Hyacinth Meeker, PA-C [facility] 06/08/2022, 11:37 AM  Cc: [facility]

## 2022-07-01 ENCOUNTER — Encounter (HOSPITAL_BASED_OUTPATIENT_CLINIC_OR_DEPARTMENT_OTHER): Payer: Self-pay

## 2022-07-03 ENCOUNTER — Ambulatory Visit (HOSPITAL_BASED_OUTPATIENT_CLINIC_OR_DEPARTMENT_OTHER): Payer: Medicaid Other

## 2022-07-03 DIAGNOSIS — Z9229 Personal history of other drug therapy: Secondary | ICD-10-CM | POA: Diagnosis not present

## 2022-07-03 DIAGNOSIS — Z23 Encounter for immunization: Secondary | ICD-10-CM | POA: Diagnosis not present

## 2022-07-03 DIAGNOSIS — Z7689 Persons encountering health services in other specified circumstances: Secondary | ICD-10-CM | POA: Diagnosis not present

## 2022-07-05 ENCOUNTER — Ambulatory Visit (HOSPITAL_BASED_OUTPATIENT_CLINIC_OR_DEPARTMENT_OTHER): Payer: Medicaid Other

## 2022-07-05 ENCOUNTER — Other Ambulatory Visit (HOSPITAL_COMMUNITY)
Admission: RE | Admit: 2022-07-05 | Discharge: 2022-07-05 | Disposition: A | Discharge status: Home / Self Care | Payer: Medicaid Other | Source: Ambulatory Visit | Attending: Obstetrics & Gynecology | Admitting: Obstetrics & Gynecology

## 2022-07-05 DIAGNOSIS — N898 Other specified noninflammatory disorders of vagina: Secondary | ICD-10-CM | POA: Diagnosis not present

## 2022-07-05 NOTE — Progress Notes (Signed)
Patient came in today for to do a self aptima swab. Patient is complaining of vaginal discharge and itching.

## 2022-07-06 ENCOUNTER — Other Ambulatory Visit: Payer: Self-pay | Admitting: Obstetrics and Gynecology

## 2022-07-06 DIAGNOSIS — B379 Candidiasis, unspecified: Secondary | ICD-10-CM

## 2022-07-06 LAB — CERVICOVAGINAL ANCILLARY ONLY
Bacterial Vaginitis (gardnerella): NEGATIVE
Candida Glabrata: NEGATIVE
Candida Vaginitis: POSITIVE — AB
Chlamydia: NEGATIVE
Comment: NEGATIVE
Comment: NEGATIVE
Comment: NEGATIVE
Comment: NEGATIVE
Comment: NEGATIVE
Comment: NORMAL
Neisseria Gonorrhea: NEGATIVE
Trichomonas: NEGATIVE

## 2022-07-06 MED ORDER — FLUCONAZOLE 150 MG PO TABS: 150.0000 mg | ORAL_TABLET | Freq: Once | ORAL | 0 refills | Status: AC

## 2022-08-07 ENCOUNTER — Other Ambulatory Visit: Payer: Medicaid Other | Admitting: *Deleted

## 2022-08-07 ENCOUNTER — Encounter: Payer: Self-pay | Admitting: *Deleted

## 2022-08-07 NOTE — Patient Instructions (Signed)
Visit Information  Lacey Perez was given information about Medicaid Managed Care team care coordination services as a part of their Healthy [facility] Medicaid benefit. Lacey Perez verbally consented to engagement with the [facility] Managed Care team.   If you are experiencing a medical emergency, please call 911 or report to your local emergency department or urgent care.   If you have a non-emergency medical problem during routine business hours, please contact your provider's office and ask to speak with a nurse.   For questions related to your Healthy [facility] health plan, please call: [phone] or visit the homepage here: [email or web address]  If you would like to schedule transportation through your Healthy [facility] plan, please call the following number at least 2 days in advance of your appointment: [phone]  For information about your ride after you set it up, call Ride Assist at [phone]. Use this number to activate a Will Call pickup, or if your transportation is late for a scheduled pickup. Use this number, too, if you need to make a change or cancel a previously scheduled reservation.  If you need transportation services right away, call [phone]. The after-hours call center is staffed 24 hours to handle ride assistance and urgent reservation requests (including discharges) 365 days a year. Urgent trips include sick visits, hospital discharge requests and life-sustaining treatment.  Call the [place] at [phone], at any time, 24 hours a day, 7 days a week. If you are in danger or need immediate medical attention call 911.  If you would like help to quit smoking, call 1-800-QUIT-NOW (469) 642-2347) OR Espaol: 1-855-Djelo-Ya (0-932-355-7322) o para ms informacin haga clic aqu or Text READY to 200-400 to register via text  Lacey Perez,    Patient verbalizes understanding of instructions and  care plan provided today and agrees to view in [place]. Active MyChart status and patient understanding of how to access instructions and care plan via MyChart confirmed with patient.     Telephone follow up appointment with Managed Medicaid care management team member scheduled for:10/06/22 @ 2:30pm  [name] RN, BSN [place] RN Care Coordinator   Following is a copy of your plan of care:  Care Plan : RN Care Manager Plan of Care  Updates made by Melissa Montane, RN since 08/07/2022 12:00 AM     Problem: Health Management needs related to Ulcerative Colitis      Long-Range Goal: Development of Plan of Care to address Health Management needs related to Ulcerative Colitis   Start Date: 12/07/2021  Expected End Date: 10/20/2022  Priority: High  Note:   Current Barriers:  Chronic Disease Management support and education needs related to Ulcerative Colitis  [name] reports improvement in her health. She is taking her medications as prescribed and eating the right foods. She will be changing PCPs and has a new appointment on 09-07-22. 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  RNCM Clinical Goal(s):  Patient will verbalize understanding of plan for management of Ulcerative Colitis as evidenced by patient reports take all medications exactly as prescribed and will call provider for medication related questions as evidenced by patient reports and documentation in EMR    attend all scheduled medical appointments: 09/07/22 with new PCP and scheduling follow up with [facility] as evidenced by provider documentation in EMR        work with social worker to address [place] Concerns  related to the management  of Ulcerative Colitis as evidenced by review of EMR and patient or social worker report     through collaboration with Consulting civil engineer, provider, and care team.   Interventions: Inter-disciplinary care team collaboration (see  longitudinal plan of care) Evaluation of current treatment plan related to  self management and patient's adherence to plan as established by provider  Ulcerative Colitis  (Status: Goal on Track (progressing): YES.) Long Term Goal  Evaluation of current treatment plan related to  Ulcerative Colitis ,  self-management and patient's adherence to plan as established by provider. Discussed plans with patient for ongoing care management follow up and provided patient with direct contact information for care management team Advised patient to journal her symptoms and pain to track triggers; Reviewed medications with patient and discussed humira and its side effects; Reviewed scheduled/upcoming provider appointments including new PCP on 09/07/22; Reviewed provider notes from recent GI visit Advised patient to contact GI provider with any new flare up Collaborated with LCSW, scheduled telephone outreach on 08/09/22  Provided education on managing stress  Patient Goals/Self-Care Activities: Take medications as prescribed   Attend all scheduled provider appointments Call provider office for new concerns or questions  Work with LCSW for managing stress

## 2022-08-07 NOTE — Patient Outreach (Signed)
Medicaid Managed Care   Nurse Care Manager Note  08/07/2022 Name:  Lacey Perez MRN:  [id number] DOB:  [birth year]  Lacey Perez is an 22 y.o. year old female who is a primary patient of [place]  The [facility] Managed Care Coordination team was consulted for assistance with:    Colitis  Lacey Perez was given information about Medicaid Managed Care Coordination team services today. Lacey Perez Patient agreed to services and verbal consent obtained.  Engaged with patient by telephone for follow up visit in response to provider referral for case management and/or care coordination services.   Assessments/Interventions:  Review of past medical history, allergies, medications, health status, including review of consultants reports, laboratory and other test data, was performed as part of comprehensive evaluation and provision of chronic care management services.  SDOH (Social Determinants of Health) assessments and interventions performed: SDOH Interventions    Flowsheet Row Patient Outreach Telephone from 08/07/2022 in [place] Patient Outreach Telephone from 04/03/2022 in [place] Patient Outreach Telephone from 02/08/2022 in [place] Patient Outreach Telephone from 12/27/2021 in [place] Patient Outreach Telephone from 12/13/2021 in [place] Patient Outreach Telephone from 12/07/2021 in [place] Interventions        Food Insecurity Interventions -- -- -- -- -- Intervention Not Indicated  Housing Interventions Intervention Not Indicated Intervention Not Indicated -- -- -- Intervention Not Indicated  Transportation Interventions Intervention Not Indicated Intervention Not Indicated -- -- -- Intervention Not  Indicated  Utilities Interventions Intervention Not Indicated Intervention Not Indicated -- -- -- --  Stress Interventions -- -- Lacey Perez  No Known Allergies  Medications Reviewed Today     Reviewed by Lacey Montane, RN (Registered Nurse) on 08/07/22 at 1539  Med List Status: <None>   Medication Order Taking? Sig Documenting Provider Last Dose Status Informant  Adalimumab (HUMIRA PEN) 40 MG/0.4ML PNKT [id number] Yes Inject 1 pen. into the skin every 14 (fourteen) days. Lacey Banister, MD Taking Active            Med Note Lacey Perez Feb 02, 2022 10:27 AM)      Discontinued 10/11/19 1240 azathioprine (IMURAN) 100 MG tablet [id number] Yes Take 1.5 tablets (150 mg total) by mouth daily. Lacey Banister, MD Taking Active   fluticasone [facility]) 50 MCG/ACT nasal spray [id number] Yes Place 2 sprays into both nostrils daily.  Patient taking differently: Place 2 sprays into both nostrils daily as needed for allergies.   Lacey Ripple, MD Taking Active     Discontinued 10/11/19 1240   Discontinued 06/13/20 1454   mesalamine (LIALDA) 1.2 g EC tablet [id number] Yes Take 4 tablets (4.8 g total) by mouth daily with breakfast. Lacey Erp, PA Taking Active     Discontinued 10/11/19 1240 Probiotic Product (PROBIOTIC PO) [id number] Yes Take 1 capsule by mouth daily. [provider] Taking Active Self            There are no problems to display for this patient.   Conditions to be addressed/monitored per PCP order:   Colitis  Care Plan : [place] of Care  Updates made by Lacey Montane, RN since 08/07/2022 12:00 AM  Problem: Health Management needs related to Ulcerative Colitis      Long-Range Goal: Development of Plan of Care to address Health Management needs related to Ulcerative  Colitis   Start Date: 12/07/2021  Expected End Date: 10/20/2022  Priority: High  Note:   Current Barriers:  Chronic Disease Management support and education needs related to Ulcerative Colitis  [name] reports improvement in her health. She is taking her medications as prescribed and eating the right foods. She will be changing PCPs and has a new appointment on 09-07-22. 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  RNCM Clinical Goal(s):  Patient will verbalize understanding of plan for management of Ulcerative Colitis as evidenced by patient reports take all medications exactly as prescribed and will call provider for medication related questions as evidenced by patient reports and documentation in EMR    attend all scheduled medical appointments: 09/07/22 with new PCP and scheduling follow up with [facility] as evidenced by provider documentation in EMR        work with Education officer, museum to address [place] Concerns  related to the management of Ulcerative Colitis as evidenced by review of EMR and patient or social worker report     through collaboration with Consulting civil engineer, provider, and care team.   Interventions: Inter-disciplinary care team collaboration (see longitudinal plan of care) Evaluation of current treatment plan related to  self management and patient's adherence to plan as established by provider  Ulcerative Colitis  (Status: Goal on Track (progressing): YES.) Long Term Goal  Evaluation of current treatment plan related to  Ulcerative Colitis ,  self-management and patient's adherence to plan as established by provider. Discussed plans with patient for ongoing care management follow up and provided patient with direct contact information for care management team Advised patient to journal her symptoms and pain to track triggers; Reviewed medications with patient and discussed humira and its side effects; Reviewed scheduled/upcoming provider  appointments including new PCP on 09/07/22; Reviewed provider notes from recent GI visit Advised patient to contact GI provider with any new flare up Collaborated with LCSW, scheduled telephone outreach on 08/09/22  Provided education on managing stress  Patient Goals/Self-Care Activities: Take medications as prescribed   Attend all scheduled provider appointments Call provider office for new concerns or questions  Work with LCSW for managing stress       Follow Up:  Patient agrees to Care Plan and Follow-up.  Plan: The Managed Medicaid care management team will reach out to the patient again over the next 60 days.  Date/time of next scheduled RN care management/care coordination outreach:  10/06/22 @ 2:30pm  [name] RN, BSN [place] RN Care Coordinator

## 2022-08-09 ENCOUNTER — Other Ambulatory Visit: Payer: Medicaid Other | Admitting: Licensed Clinical Social Worker

## 2022-08-09 NOTE — Patient Outreach (Signed)
Medicaid Managed Care Social Work Note  08/09/2022 Name:  Lacey Perez MRN:  [id number] DOB:  2000/09/09  Lacey Perez is an 22 y.o. year old female who is a primary patient of [facility]  The Medicaid Managed Care Coordination team was consulted for assistance with:  Mental Health Counseling and Resources  Lacey Perez was given information about Medicaid Managed Care Coordination team services today. Lacey Perez Patient agreed to services and verbal consent obtained.  Engaged with patient  for by telephone forfollow up visit in response to referral for case management and/or care coordination services.   Assessments/Interventions:  Review of past medical history, allergies, medications, health status, including review of consultants reports, laboratory and other test data, was performed as part of comprehensive evaluation and provision of chronic care management services.  SDOH: (Social Determinant of Health) assessments and interventions performed: SDOH Interventions    Flowsheet Row Patient Outreach Telephone from 08/09/2022 in [place] POPULATION HEALTH DEPARTMENT Patient Outreach Telephone from 08/07/2022 in [place] POPULATION HEALTH DEPARTMENT Counselor from 04/10/2022 in [facility] Patient Outreach Telephone from 04/03/2022 in [facility] Community Care Coordination Patient Outreach Telephone from 02/08/2022 in [facility] Care Coordination Patient Outreach Telephone from 12/27/2021 in [facility] [facility] Care Coordination  SDOH Interventions        Housing Interventions -- Intervention Not Indicated -- Intervention Not Indicated -- --  Transportation Interventions -- Intervention Not Indicated -- Intervention Not Indicated -- --  Utilities Interventions -- Intervention Not Indicated -- Intervention Not Indicated -- --  Depression Interventions/Treatment  Counseling --  Currently on Treatment -- -- --  Stress Interventions Offered [employer], Provide Counseling  [facility] LCSW reconnected pt with her counselor Lacey Perez at [employer] -- -- -- Social research officer, government [employer], Provide Counseling       Advanced Directives Status:  See Care Plan for related entries.  Care Plan                 No Known Allergies  Medications Reviewed Today     Reviewed by Heidi Dach, RN (Registered Nurse) on 08/07/22 at 1539  Med List Status: <None>   Medication Order Taking? Sig Documenting Provider Last Dose Status Informant  Adalimumab (HUMIRA PEN) 40 MG/0.4ML PNKT [id number] Yes Inject 1 pen. into the skin every 14 (fourteen) days. [name], MD Taking Active            Med Note [name] Feb 02, 2022 10:27 AM)      Discontinued 10/11/19 1240 azathioprine (IMURAN) 100 MG tablet [id number] Yes Take 1.5 tablets (150 mg total) by mouth daily. [name], MD Taking Active   fluticasone [facility]) 50 MCG/ACT nasal spray [id number] Yes Place 2 sprays into both nostrils daily.  Patient taking differently: Place 2 sprays into both nostrils daily as needed for allergies.   [name], MD Taking Active     Discontinued 10/11/19 1240   Discontinued 06/13/20 1454   mesalamine (LIALDA) 1.2 g EC tablet [id number] Yes Take 4 tablets (4.8 g total) by mouth daily with breakfast. [name], PA Taking Active     Discontinued 10/11/19 1240 Probiotic Product (PROBIOTIC PO) [id number] Yes Take 1 capsule by mouth daily. [provider] Taking Active Self            There are no problems to display for this patient.   Conditions to be addressed/monitored  per PCP order:   Stress  Care Plan : LCSW Plan of Care  Updates made by Gustavus Bryant, LCSW since 08/09/2022 12:00 AM     Problem: Stress concerns      Long-Range Goal: Stress Symptoms Identified   Start Date: 12/13/2021   Priority: High  Note:   Priority: High  Timeframe:  Long-Range Goal Priority:  High Start Date:   12/13/21               Expected End Date:  02/08/22  - consider counseling or psychiatry, read email explaining the difference between the two -consider bumping up your self-care  -consider creating a stronger support network  Why is this important?             Beating depression/stress may take some time.            If you don't feel better right away, don't give up on your treatment plan.    Current barriers:   Chronic Mental Health needs related to stress. Patient requires Support, Education, Resources, Referrals, Advocacy, and Care Coordination, in order to meet Unmet Mental Health Needs. Patient will implement clinical interventions discussed today to decrease symptoms of stress and increase knowledge and/or ability of: coping skills. Mental Health Concerns and Social Isolation Patient lacks knowledge of available community counseling agencies and resources.  Clinical Goal(s): verbalize understanding of plan for management of Stress and demonstrate a reduction in symptoms. Patient will consider RE-connecting with a provider for ongoing mental health treatment, increase coping skills, healthy habits, self-management skills, and stress reduction        Clinical Interventions:  Assessed patient's previous and current treatment, coping skills, support system and barriers to care.  Verbalization of feelings encouraged, motivational interviewing employed Emotional support provided, positive coping strategies explored Self care/establishing healthy boundaries emphasized Patient reports that she does not know what counseling and psychiatry is. [facility] LCSW educated her on this. Email was sent to patient on 12/13/21 with the explanation of these services as well. Patient wants to consider these resources first before agreeing to a referral. UPDATE 12/27/21- Patient is interested in gaining counseling  only. Referral made to [facility]. New emails were sent to patient with information on [facility].  [facility] LCSW sent list of coping skills for stress management in email as well for her to review and consider implementing.  LCSW provided education on relaxation techniques such as meditation, deep breathing, massage, grounding exercises or yoga that can activate the body's relaxation response and ease symptoms of stress. LCSW ask that when pt is struggling with difficult emotions and racing thoughts that they start this relaxation response process.  Patient will work on implementing appropriate self-care habits into their daily routine such as: staying positive, writing a gratitude list, drinking water, staying active around the house, taking their medications as prescribed, combating negative thoughts or emotions and staying connected with their family and friends. Positive reinforcement provided for this decision to work on this. Motivational Interviewing employed Depression screen reviewed  Mindfulness or Relaxation training provided Active listening / Reflection utilized  Emotional Support Provided Problem Solving /Task Center strategies reviewed Provided psychoeducation for mental health needs  Provided brief CBT  Reviewed mental health medications and discussed importance of compliance:  Quality of sleep assessed & Sleep Hygiene techniques promoted  Participation in counseling encouraged  Verbalization of feelings encouraged  Suicidal Ideation/Homicidal Ideation assessed: Patient denies SI/HI  Review resources, discussed options and provided patient information about  Mental Health Resource  Education Inter-disciplinary care team collaboration (see longitudinal plan of care)   UPDATE 02/08/22- Patient has been successfully established with a mental health counselor at [facility]. She has already met with counselor and completed her intake. Patient will start therapy next month. She has two future counseling  appointments scheduled. Patient denies any future mental health concerns and is agreeable to social work discharge at this time. [place] LCSW will update [facility] RNCM and close case as goal is now met. 08/09/22 update- [place] LCSW was asked to get back involved with patient as she has contacted [facility] several times in order to reschedule her last mental health appointment and never received a return call back. [facility] LCSW and patient completed joint call to [facility] and scheduled her for a telephonic therapy session on 09/01/22 at 10 am. Email sent to patient. [facility] LCSW provided brief self-care education. [facility] LCSW will follow up next month.   Patient Goals/Self-Care Activities: Over the next 120 days Attend scheduled medical appointments Utilize healthy coping skills and supportive resources discussed Contact PCP with any questions or concerns Consider gaining counseling and psychiatry services Implement self-care into your daily routine Call your insurance provider for more information about your Enhanced Benefits  Check out counseling resources provided  Begin personal counseling with LCSW, to reduce and manage symptoms of Depression and Stress, until well-established with mental health provider Incorporate into daily practice - relaxation techniques, deep breathing exercises, and mindfulness meditation strategies. Talk about feelings with friends, family members, spiritual advisor, etc. Contact LCSW directly [phone]), if you have questions, need assistance, or if additional social work needs are identified between now and our next scheduled telephone outreach call. Call 988 for mental health hotline/crisis line if needed (24/7 available) Try techniques to reduce symptoms of anxiety/negative thinking (deep breathing, distraction, positive self talk, etc)  - develop a personal safety plan - develop a plan to deal with triggers like holidays, anniversaries - exercise at least 2 to 3 times per week - have a plan  for how to handle bad days - journal feelings and what helps to feel better or worse - spend time or talk with others at least 2 to 3 times per week - watch for early signs of feeling worse - begin personal counseling - call and visit an old friend - check out volunteer opportunities - join a support group - laugh; watch a funny movie or comedian - learn and use visualization or guided imagery - perform a random act of kindness - practice relaxation or meditation daily - start or continue a personal journal - practice positive thinking and self-talk -continue with compliance of taking medication  -identify current effective and ineffective coping strategies.  -implement positive self-talk in care to increase self-esteem, confidence and feelings of control.  -consider alternative and complementary therapy approaches such as meditation, mindfulness or yoga.  -journaling, prayer, worship services, meditation or pastoral counseling.  -increase participation in pleasurable group activities such as hobbies, singing, sports or volunteering).  -consider the use of meditative movement therapy such as tai chi, yoga or qigong.  -start a regular daily exercise program based on tolerance, ability and patient choice to support positive thinking and activity     Patient Goals: Follow up goal      Follow up:  Patient agrees to Care Plan and Follow-up.  Plan: The Managed Medicaid care management team will reach out to the patient again over the next 30 days.  Date/time of next scheduled Social Work care management/care coordination  outreach:  08/25/22 at 9 am  Eula Fried, BSW, MSW, [employer] Managed Medicaid LCSW [email or web address] Phone: [phone]

## 2022-08-09 NOTE — Patient Instructions (Signed)
Visit Information  Lacey Perez was given information about Medicaid Managed Care team care coordination services as a part of their Healthy [facility] Medicaid benefit. Glee Warmack verbally consented to engagement with the [facility] Managed Care team.   If you are experiencing a medical emergency, please call 911 or report to your local emergency department or urgent care.   If you have a non-emergency medical problem during routine business hours, please contact your provider's office and ask to speak with a nurse.   For questions related to your Healthy [facility] health plan, please call: [phone] or visit the homepage here: [email or web address]  If you would like to schedule transportation through your Healthy [facility] plan, please call the following number at least 2 days in advance of your appointment: [phone]  For information about your ride after you set it up, call Ride Assist at [phone]. Use this number to activate a Will Call pickup, or if your transportation is late for a scheduled pickup. Use this number, too, if you need to make a change or cancel a previously scheduled reservation.  If you need transportation services right away, call [phone]. The after-hours call center is staffed 24 hours to handle ride assistance and urgent reservation requests (including discharges) 365 days a year. Urgent trips include sick visits, hospital discharge requests and life-sustaining treatment.  Call the [place] at [phone], at any time, 24 hours a day, 7 days a week. If you are in danger or need immediate medical attention call 911.  If you would like help to quit smoking, call 1-800-QUIT-NOW 615 211 0219) OR Espaol: 1-855-Djelo-Ya (4-742-595-6387) o para ms informacin haga clic aqu or Text READY to 200-400 to register via text   Following is a copy of your plan of care:  Care Plan : LCSW Plan of Care   Updates made by Greg Cutter, LCSW since 08/09/2022 12:00 AM     Problem: Stress concerns      Long-Range Goal: Stress Symptoms Identified   Start Date: 12/13/2021  Priority: High  Note:   Priority: High  Timeframe:  Long-Range Goal Priority:  High Start Date:   12/13/21               Expected End Date:  02/08/22  - consider counseling or psychiatry, read email explaining the difference between the two -consider bumping up your self-care  -consider creating a stronger support network  Why is this important?             Beating depression/stress may take some time.            If you don't feel better right away, don't give up on your treatment plan.    Current barriers:   Chronic Mental Health needs related to stress. Patient requires Support, Education, Resources, Referrals, Advocacy, and Care Coordination, in order to meet Unmet Mental Health Needs. Patient will implement clinical interventions discussed today to decrease symptoms of stress and increase knowledge and/or ability of: coping skills. Mental Health Concerns and Social Isolation Patient lacks knowledge of available community counseling agencies and resources.  Clinical Goal(s): verbalize understanding of plan for management of Stress and demonstrate a reduction in symptoms. Patient will consider RE-connecting with a provider for ongoing mental health treatment, increase coping skills, healthy habits, self-management skills, and stress reduction       Patient Goals/Self-Care Activities: Over the next 120 days Attend scheduled medical appointments Utilize healthy coping skills and supportive resources discussed Contact PCP with any questions  or concerns Consider gaining counseling and psychiatry services Implement self-care into your daily routine Call your insurance provider for more information about your Enhanced Benefits  Check out counseling resources provided  Begin personal counseling with LCSW, to reduce  and manage symptoms of Depression and Stress, until well-established with mental health provider Incorporate into daily practice - relaxation techniques, deep breathing exercises, and mindfulness meditation strategies. Talk about feelings with friends, family members, spiritual advisor, etc. Contact LCSW directly [phone]), if you have questions, need assistance, or if additional social work needs are identified between now and our next scheduled telephone outreach call. Call 988 for mental health hotline/crisis line if needed (24/7 available) Try techniques to reduce symptoms of anxiety/negative thinking (deep breathing, distraction, positive self talk, etc)  - develop a personal safety plan - develop a plan to deal with triggers like holidays, anniversaries - exercise at least 2 to 3 times per week - have a plan for how to handle bad days - journal feelings and what helps to feel better or worse - spend time or talk with others at least 2 to 3 times per week - watch for early signs of feeling worse - begin personal counseling - call and visit an old friend - check out volunteer opportunities - join a support group - laugh; watch a funny movie or comedian - learn and use visualization or guided imagery - perform a random act of kindness - practice relaxation or meditation daily - start or continue a personal journal - practice positive thinking and self-talk -continue with compliance of taking medication  -identify current effective and ineffective coping strategies.  -implement positive self-talk in care to increase self-esteem, confidence and feelings of control.  -consider alternative and complementary therapy approaches such as meditation, mindfulness or yoga.  -journaling, prayer, worship services, meditation or pastoral counseling.  -increase participation in pleasurable group activities such as hobbies, singing, sports or volunteering).  -consider the use of meditative  movement therapy such as tai chi, yoga or qigong.  -start a regular daily exercise program based on tolerance, ability and patient choice to support positive thinking and activity     Patient Goals: Follow up goal

## 2022-08-18 ENCOUNTER — Encounter: Payer: Self-pay | Admitting: Physician Assistant

## 2022-08-18 DIAGNOSIS — Z20822 Contact with and (suspected) exposure to covid-19: Secondary | ICD-10-CM | POA: Diagnosis not present

## 2022-08-25 ENCOUNTER — Other Ambulatory Visit: Payer: Medicaid Other | Admitting: Licensed Clinical Social Worker

## 2022-08-25 NOTE — Patient Outreach (Signed)
Medicaid Managed Care Social Work Note  08/25/2022 Name:  Lacey Perez MRN:  [id number] DOB:  Sep 30, 2000  Lacey Perez is an 22 y.o. year old female who is a primary patient of [place].  The Medicaid Managed Care Coordination team was consulted for assistance with:  [place] and Resources  [name] was given information about Medicaid Managed Care Coordination team services today. [name] Patient agreed to services and verbal consent obtained.  Engaged with patient  for by telephone forfollow up visit in response to referral for case management and/or care coordination services.   Assessments/Interventions:  Review of past medical history, allergies, medications, health status, including review of consultants reports, laboratory and other test data, was performed as part of comprehensive evaluation and provision of chronic care management services.  SDOH: (Social Determinant of Health) assessments and interventions performed: SDOH Interventions    Flowsheet Row Patient Outreach Telephone from 08/25/2022 in [place] Patient Outreach Telephone from 08/09/2022 in [place] Patient Outreach Telephone from 08/07/2022 in [place] Counselor from 04/10/2022 in [facility] Patient Outreach Telephone from 04/03/2022 in [place] Patient Outreach Telephone from 02/08/2022 in [place] Coordination  SDOH Interventions        Housing Interventions -- -- Intervention Not Indicated -- Intervention Not Indicated --  Transportation Interventions -- -- Intervention Not Indicated -- Intervention Not Indicated --  Utilities Interventions -- -- Intervention Not Indicated -- Intervention Not Indicated --  Depression Interventions/Treatment  -- Counseling -- Currently on  Treatment -- --  Stress Interventions Offered [employer], Provide Counseling [place], Provide Counseling  [facility] LCSW reconnected pt with her counselor [name] at [employer] -- -- -- Energy manager Status:  See Care Plan for related entries.  Care Plan                 No Known Allergies  Medications Reviewed Today     Reviewed by Melissa Montane, RN (Registered Nurse) on 08/07/22 at 1539  Med List Status: <None>   Medication Order Taking? Sig Documenting Provider Last Dose Status Informant  Adalimumab (HUMIRA PEN) 40 MG/0.4ML PNKT [id number] Yes Inject 1 pen. into the skin every 14 (fourteen) days. Milus Banister, MD Taking Active            Med Note [name] Feb 02, 2022 10:27 AM)      Discontinued 10/11/19 1240 azathioprine (IMURAN) 100 MG tablet [id number] Yes Take 1.5 tablets (150 mg total) by mouth daily. Milus Banister, MD Taking Active   fluticasone [facility]) 50 MCG/ACT nasal spray [id number] Yes Place 2 sprays into both nostrils daily.  Patient taking differently: Place 2 sprays into both nostrils daily as needed for allergies.   Melynda Ripple, MD Taking Active     Discontinued 10/11/19 1240   Discontinued 06/13/20 1454   mesalamine (LIALDA) 1.2 g EC tablet [id number] Yes Take 4 tablets (4.8 g total) by mouth daily with breakfast. Levin Erp, PA Taking Active     Discontinued 10/11/19 1240 Probiotic Product (PROBIOTIC PO) [id number] Yes Take 1 capsule by mouth daily. [provider] Taking Active Self            There are no problems to display for this patient.   Conditions to be addressed/monitored per  PCP order:   Stress management  Care Plan : LCSW Plan of Care  Updates made by Greg Cutter, LCSW since 08/25/2022 12:00 AM     Problem: Stress concerns      Long-Range Goal: Stress Symptoms Identified   Start Date: 12/13/2021  Priority:  High  Note:   Priority: High  Timeframe:  Long-Range Goal Priority:  High Start Date:   12/13/21               Expected End Date:  ongoing  - consider counseling or psychiatry, read email explaining the difference between the two -consider bumping up your self-care  -consider creating a stronger support network  Why is this important?             Beating depression/stress may take some time.            If you don't feel better right away, don't give up on your treatment plan.    Current barriers:   Chronic Mental Health needs related to stress. Patient requires Support, Education, Resources, Referrals, Advocacy, and Care Coordination, in order to meet Unmet Mental Health Needs. Patient will implement clinical interventions discussed today to decrease symptoms of stress and increase knowledge and/or ability of: coping skills. Mental Health Concerns and Social Isolation Patient lacks knowledge of available community counseling agencies and resources.  Clinical Goal(s): verbalize understanding of plan for management of Stress and demonstrate a reduction in symptoms. Patient will consider RE-connecting with a provider for ongoing mental health treatment, increase coping skills, healthy habits, self-management skills, and stress reduction        Clinical Interventions:  Assessed patient's previous and current treatment, coping skills, support system and barriers to care.  Verbalization of feelings encouraged, motivational interviewing employed Emotional support provided, positive coping strategies explored Self care/establishing healthy boundaries emphasized Patient reports that she does not know what counseling and psychiatry is. [facility] LCSW educated her on this. Email was sent to patient on 12/13/21 with the explanation of these services as well. Patient wants to consider these resources first before agreeing to a referral. UPDATE 12/27/21- Patient is interested in gaining counseling only.  Referral made to [facility]. New emails were sent to patient with information on [facility].  [facility] LCSW sent list of coping skills for stress management in email as well for her to review and consider implementing.  LCSW provided education on relaxation techniques such as meditation, deep breathing, massage, grounding exercises or yoga that can activate the body's relaxation response and ease symptoms of stress. LCSW ask that when pt is struggling with difficult emotions and racing thoughts that they start this relaxation response process.  Patient will work on implementing appropriate self-care habits into their daily routine such as: staying positive, writing a gratitude list, drinking water, staying active around the house, taking their medications as prescribed, combating negative thoughts or emotions and staying connected with their family and friends. Positive reinforcement provided for this decision to work on this. Motivational Interviewing employed Depression screen reviewed  Mindfulness or Relaxation training provided Active listening / Reflection utilized  Emotional Support Provided Problem [place] strategies reviewed Provided psychoeducation for mental health needs  Provided brief CBT  Reviewed mental health medications and discussed importance of compliance:  Quality of sleep assessed & Sleep Hygiene techniques promoted  Participation in counseling encouraged  Verbalization of feelings encouraged  Suicidal Ideation/Homicidal Ideation assessed: Patient denies SI/HI  Review resources, discussed options and provided patient information about  Mental Health Resource  Education Inter-disciplinary care team collaboration (see longitudinal plan of care)   UPDATE 02/08/22- Patient has been successfully established with a mental health counselor at [facility]. She has already met with counselor and completed her intake. Patient will start therapy next month. She has two future counseling  appointments scheduled. Patient denies any future mental health concerns and is agreeable to social work discharge at this time. [place] LCSW will update [facility] RNCM and close case as goal is now met. 08/09/22 update- [place] LCSW was asked to get back involved with patient as she has contacted [facility] several times in order to reschedule her last mental health appointment and never received a return call back. [facility] LCSW and patient completed joint call to [facility] and scheduled her for a telephonic therapy session on 09/01/22 at 10 am. Email sent to patient. [facility] LCSW provided brief self-care education. [facility] LCSW will follow up next month. 08/25/22 LCSW update- Patient reports stable transportation to her upcoming initial therapy session next Friday at 10 am. She reports that she will notify [facility] LCSW if she has any concerns related to start back therapy. [facility] LCSW will follow up within two weeks to ensure her mental health needs were met. Brief self-care education provided.   Patient Goals/Self-Care Activities: Over the next 120 days Attend scheduled medical appointments Utilize healthy coping skills and supportive resources discussed Contact PCP with any questions or concerns Consider gaining counseling and psychiatry services Implement self-care into your daily routine Call your insurance provider for more information about your Enhanced Benefits  Check out counseling resources provided  Begin personal counseling with LCSW, to reduce and manage symptoms of Depression and Stress, until well-established with mental health provider Incorporate into daily practice - relaxation techniques, deep breathing exercises, and mindfulness meditation strategies. Talk about feelings with friends, family members, spiritual advisor, etc. Contact LCSW directly [phone]), if you have questions, need assistance, or if additional social work needs are identified between now and our next scheduled telephone outreach call. Call 988 for  mental health hotline/crisis line if needed (24/7 available) Try techniques to reduce symptoms of anxiety/negative thinking (deep breathing, distraction, positive self talk, etc)  - develop a personal safety plan - develop a plan to deal with triggers like holidays, anniversaries - exercise at least 2 to 3 times per week - have a plan for how to handle bad days - journal feelings and what helps to feel better or worse - spend time or talk with others at least 2 to 3 times per week - watch for early signs of feeling worse - begin personal counseling - call and visit an old friend - check out volunteer opportunities - join a support group - laugh; watch a funny movie or comedian - learn and use visualization or guided imagery - perform a random act of kindness - practice relaxation or meditation daily - start or continue a personal journal - practice positive thinking and self-talk -continue with compliance of taking medication  -identify current effective and ineffective coping strategies.  -implement positive self-talk in care to increase self-esteem, confidence and feelings of control.  -consider alternative and complementary therapy approaches such as meditation, mindfulness or yoga.  -journaling, prayer, worship services, meditation or pastoral counseling.  -increase participation in pleasurable group activities such as hobbies, singing, sports or volunteering).  -consider the use of meditative movement therapy such as tai chi, yoga or qigong.  -start a regular daily exercise program based on tolerance, ability and patient choice to support positive thinking  and activity     Patient Goals: Follow up goal      Follow up:  Patient agrees to Care Plan and Follow-up.  Plan: The Managed Medicaid care management team will reach out to the patient again over the next 30 days.  Date/time of next scheduled Social Work care management/care coordination outreach:  09/06/22 at 9  am  Dickie La, BSW, MSW, [employer] Managed Medicaid LCSW [facility]  [email or web address] Phone: [phone]

## 2022-08-25 NOTE — Patient Instructions (Signed)
Visit Information  Ms. Birenbaum was given information about Medicaid Managed Care team care coordination services as a part of their Healthy [facility] Medicaid benefit. Shaquetta Ackers verbally consented to engagement with the [facility] Managed Care team.   If you are experiencing a medical emergency, please call 911 or report to your local emergency department or urgent care.   If you have a non-emergency medical problem during routine business hours, please contact your provider's office and ask to speak with a nurse.   For questions related to your Healthy [facility] health plan, please call: [phone] or visit the homepage here: [email or web address]  If you would like to schedule transportation through your Healthy [facility] plan, please call the following number at least 2 days in advance of your appointment: [phone]  For information about your ride after you set it up, call Ride Assist at [phone]. Use this number to activate a Will Call pickup, or if your transportation is late for a scheduled pickup. Use this number, too, if you need to make a change or cancel a previously scheduled reservation.  If you need transportation services right away, call [phone]. The after-hours call center is staffed 24 hours to handle ride assistance and urgent reservation requests (including discharges) 365 days a year. Urgent trips include sick visits, hospital discharge requests and life-sustaining treatment.  Call the [place] at [phone], at any time, 24 hours a day, 7 days a week. If you are in danger or need immediate medical attention call 911.  If you would like help to quit smoking, call 1-800-QUIT-NOW 650-859-7675) OR Espaol: 1-855-Djelo-Ya (4-332-951-8841) o para ms informacin haga clic aqu or Text READY to 200-400 to register via text  Following is a copy of your plan of care:  Care Plan : LCSW Plan of Care   Updates made by Greg Cutter, LCSW since 08/25/2022 12:00 AM     Problem: Stress concerns      Long-Range Goal: Stress Symptoms Identified   Start Date: 12/13/2021  Priority: High  Note:   Priority: High  Timeframe:  Long-Range Goal Priority:  High Start Date:   12/13/21               Expected End Date:  ongoing  - consider counseling or psychiatry, read email explaining the difference between the two -consider bumping up your self-care  -consider creating a stronger support network  Why is this important?             Beating depression/stress may take some time.            If you don't feel better right away, don't give up on your treatment plan.    Current barriers:   Chronic Mental Health needs related to stress. Patient requires Support, Education, Resources, Referrals, Advocacy, and Care Coordination, in order to meet Unmet Mental Health Needs. Patient will implement clinical interventions discussed today to decrease symptoms of stress and increase knowledge and/or ability of: coping skills. Mental Health Concerns and Social Isolation Patient lacks knowledge of available community counseling agencies and resources.  Clinical Goal(s): verbalize understanding of plan for management of Stress and demonstrate a reduction in symptoms. Patient will consider RE-connecting with a provider for ongoing mental health treatment, increase coping skills, healthy habits, self-management skills, and stress reduction        Patient Goals/Self-Care Activities: Over the next 120 days Attend scheduled medical appointments Utilize healthy coping skills and supportive resources discussed Contact PCP with any questions  or concerns Consider gaining counseling and psychiatry services Implement self-care into your daily routine Call your insurance provider for more information about your Enhanced Benefits  Check out counseling resources provided  Begin personal counseling with LCSW, to reduce  and manage symptoms of Depression and Stress, until well-established with mental health provider Incorporate into daily practice - relaxation techniques, deep breathing exercises, and mindfulness meditation strategies. Talk about feelings with friends, family members, spiritual advisor, etc. Contact LCSW directly [phone]), if you have questions, need assistance, or if additional social work needs are identified between now and our next scheduled telephone outreach call. Call 988 for mental health hotline/crisis line if needed (24/7 available) Try techniques to reduce symptoms of anxiety/negative thinking (deep breathing, distraction, positive self talk, etc)  - develop a personal safety plan - develop a plan to deal with triggers like holidays, anniversaries - exercise at least 2 to 3 times per week - have a plan for how to handle bad days - journal feelings and what helps to feel better or worse - spend time or talk with others at least 2 to 3 times per week - watch for early signs of feeling worse - begin personal counseling - call and visit an old friend - check out volunteer opportunities - join a support group - laugh; watch a funny movie or comedian - learn and use visualization or guided imagery - perform a random act of kindness - practice relaxation or meditation daily - start or continue a personal journal - practice positive thinking and self-talk -continue with compliance of taking medication  -identify current effective and ineffective coping strategies.  -implement positive self-talk in care to increase self-esteem, confidence and feelings of control.  -consider alternative and complementary therapy approaches such as meditation, mindfulness or yoga.  -journaling, prayer, worship services, meditation or pastoral counseling.  -increase participation in pleasurable group activities such as hobbies, singing, sports or volunteering).  -consider the use of meditative  movement therapy such as tai chi, yoga or qigong.  -start a regular daily exercise program based on tolerance, ability and patient choice to support positive thinking and activity     Patient Goals: Follow up goal

## 2022-09-01 ENCOUNTER — Telehealth (HOSPITAL_COMMUNITY): Payer: Self-pay | Admitting: Clinical

## 2022-09-01 ENCOUNTER — Encounter (HOSPITAL_COMMUNITY): Payer: Self-pay

## 2022-09-01 ENCOUNTER — Ambulatory Visit (HOSPITAL_COMMUNITY): Payer: Medicaid Other | Admitting: Clinical

## 2022-09-01 NOTE — Telephone Encounter (Signed)
Client did not present for the scheduled virtual therapy appointment.  Client reported however he was completing with a current appointment that she was having with class.  Client reported she would like to reschedule the appointment for in person session.  Client reported no concerns at this time.

## 2022-09-06 ENCOUNTER — Other Ambulatory Visit: Payer: Medicaid Other | Admitting: Licensed Clinical Social Worker

## 2022-09-06 NOTE — Patient Outreach (Addendum)
  Medicaid Managed Care   Unsuccessful Attempt Note   09/06/2022 Name: Lacey Perez MRN: [id number] DOB: 11-10-2000  Referred by: [facility] Reason for referral : No chief complaint on file.   An unsuccessful telephone outreach was attempted today. The patient was referred to the case management team for assistance with care management and care coordination.    Follow Up Plan: A HIPAA compliant phone message was left for the patient providing contact information and requesting a return call.  Email sent to patient as well.  Eula Fried, BSW, MSW, [employer] Managed Medicaid LCSW [email or web address] Phone: [phone]

## 2022-09-06 NOTE — Patient Instructions (Signed)
Visit Information  Lacey Perez was given information about Medicaid Managed Care team care coordination services as a part of their Healthy [facility] Medicaid benefit. Lacey Perez verbally consented to engagement with the [facility] Managed Care team.   If you are experiencing a medical emergency, please call 911 or report to your local emergency department or urgent care.   If you have a non-emergency medical problem during routine business hours, please contact your provider's office and ask to speak with a nurse.   For questions related to your Healthy [facility] health plan, please call: [phone] or visit the homepage here: [email or web address]  If you would like to schedule transportation through your Healthy [facility] plan, please call the following number at least 2 days in advance of your appointment: [phone]  For information about your ride after you set it up, call Ride Assist at [phone]. Use this number to activate a Will Call pickup, or if your transportation is late for a scheduled pickup. Use this number, too, if you need to make a change or cancel a previously scheduled reservation.  If you need transportation services right away, call [phone]. The after-hours call center is staffed 24 hours to handle ride assistance and urgent reservation requests (including discharges) 365 days a year. Urgent trips include sick visits, hospital discharge requests and life-sustaining treatment.  Call the [place] at [phone], at any time, 24 hours a day, 7 days a week. If you are in danger or need immediate medical attention call 911.  If you would like help to quit smoking, call 1-800-QUIT-NOW 209-690-2616) OR Espaol: 1-855-Djelo-Ya HD:1601594) o para ms informacin haga clic aqu or Text READY to 200-400 to register via text   Following is a copy of your plan of care:  Care Plan : LCSW Plan of Care   Updates made by Greg Cutter, LCSW since 09/06/2022 12:00 AM     Problem: Stress concerns      Long-Range Goal: Stress Symptoms Identified   Start Date: 12/13/2021  Priority: High  Note:   Priority: High  Timeframe:  Long-Range Goal Priority:  High Start Date:   12/13/21               Expected End Date:  ongoing  - consider counseling or psychiatry, read email explaining the difference between the two -consider bumping up your self-care  -consider creating a stronger support network  Why is this important?             Beating depression/stress may take some time.            If you don't feel better right away, don't give up on your treatment plan.    Current barriers:   Chronic Mental Health needs related to stress. Patient requires Support, Education, Resources, Referrals, Advocacy, and Care Coordination, in order to meet Unmet Mental Health Needs. Patient will implement clinical interventions discussed today to decrease symptoms of stress and increase knowledge and/or ability of: coping skills. Mental Health Concerns and Social Isolation Patient lacks knowledge of available community counseling agencies and resources.  Clinical Goal(s): verbalize understanding of plan for management of Stress and demonstrate a reduction in symptoms. Patient will consider RE-connecting with a provider for ongoing mental health treatment, increase coping skills, healthy habits, self-management skills, and stress reduction        Patient Goals/Self-Care Activities: Over the next 120 days Attend scheduled medical appointments Utilize healthy coping skills and supportive resources discussed Contact PCP with any  questions or concerns Consider gaining counseling and psychiatry services Implement self-care into your daily routine Call your insurance provider for more information about your Enhanced Benefits  Check out counseling resources provided  Begin personal counseling with LCSW, to reduce  and manage symptoms of Depression and Stress, until well-established with mental health provider Incorporate into daily practice - relaxation techniques, deep breathing exercises, and mindfulness meditation strategies. Talk about feelings with friends, family members, spiritual advisor, etc. Contact LCSW directly [phone]), if you have questions, need assistance, or if additional social work needs are identified between now and our next scheduled telephone outreach call. Call 988 for mental health hotline/crisis line if needed (24/7 available) Try techniques to reduce symptoms of anxiety/negative thinking (deep breathing, distraction, positive self talk, etc)  - develop a personal safety plan - develop a plan to deal with triggers like holidays, anniversaries - exercise at least 2 to 3 times per week - have a plan for how to handle bad days - journal feelings and what helps to feel better or worse - spend time or talk with others at least 2 to 3 times per week - watch for early signs of feeling worse - begin personal counseling - call and visit an old friend - check out volunteer opportunities - join a support group - laugh; watch a funny movie or comedian - learn and use visualization or guided imagery - perform a random act of kindness - practice relaxation or meditation daily - start or continue a personal journal - practice positive thinking and self-talk -continue with compliance of taking medication  -identify current effective and ineffective coping strategies.  -implement positive self-talk in care to increase self-esteem, confidence and feelings of control.  -consider alternative and complementary therapy approaches such as meditation, mindfulness or yoga.  -journaling, prayer, worship services, meditation or pastoral counseling.  -increase participation in pleasurable group activities such as hobbies, singing, sports or volunteering).  -consider the use of meditative  movement therapy such as tai chi, yoga or qigong.  -start a regular daily exercise program based on tolerance, ability and patient choice to support positive thinking and activity     Patient Goals: Follow up goal

## 2022-09-06 NOTE — Patient Outreach (Signed)
Medicaid Managed Care Social Work Note  09/06/2022 Name:  Lacey Perez MRN:  [id number] DOB:  [birth year]  Lacey Perez is an 22 y.o. year old female who is a primary patient of [place].  The Medicaid Managed Care Coordination team was consulted for assistance with:  [place] and Resources  Ms. Abood was given information about Medicaid Managed Care Coordination team services today. Lacey Perez Patient agreed to services and verbal consent obtained.  Engaged with patient  for by telephone forfollow up visit in response to referral for case management and/or care coordination services.   Assessments/Interventions:  Review of past medical history, allergies, medications, health status, including review of consultants reports, laboratory and other test data, was performed as part of comprehensive evaluation and provision of chronic care management services.  SDOH: (Social Determinant of Health) assessments and interventions performed: SDOH Interventions    Flowsheet Row Patient Outreach Telephone from 09/06/2022 in [place] Patient Outreach Telephone from 08/25/2022 in [place] Patient Outreach Telephone from 08/09/2022 in [place] Patient Outreach Telephone from 08/07/2022 in [place] Counselor from 04/10/2022 in [facility] Patient Outreach Telephone from 04/03/2022 in [place] Coordination  SDOH Interventions        Housing Interventions -- -- -- Intervention Not Indicated -- Intervention Not Indicated  Transportation Interventions -- -- -- Intervention Not Indicated -- Intervention Not Indicated  Utilities Interventions -- -- -- Intervention Not Indicated -- Intervention Not Indicated  Depression Interventions/Treatment  -- -- Counseling -- Currently on  Treatment --  Stress Interventions Offered [employer], Provide Counseling [place], Provide Counseling  [facility] LCSW reconnected pt with her counselor [name] at [employer] -- -- --       Advanced Directives Status:  See Care Plan for related entries.  Care Plan                 No Known Allergies  Medications Reviewed Today     Reviewed by Melissa Montane, RN (Registered Nurse) on 08/07/22 at 1539  Med List Status: <None>   Medication Order Taking? Sig Documenting Provider Last Dose Status Informant  Adalimumab (HUMIRA PEN) 40 MG/0.4ML PNKT [id number] Yes Inject 1 pen. into the skin every 14 (fourteen) days. Milus Banister, MD Taking Active            Med Note [name] Feb 02, 2022 10:27 AM)      Discontinued 10/11/19 1240 azathioprine (IMURAN) 100 MG tablet [id number] Yes Take 1.5 tablets (150 mg total) by mouth daily. Milus Banister, MD Taking Active   fluticasone [facility]) 50 MCG/ACT nasal spray [id number] Yes Place 2 sprays into both nostrils daily.  Patient taking differently: Place 2 sprays into both nostrils daily as needed for allergies.   Lacey Ripple, MD Taking Active     Discontinued 10/11/19 1240   Discontinued 06/13/20 1454   mesalamine (LIALDA) 1.2 g EC tablet [id number] Yes Take 4 tablets (4.8 g total) by mouth daily with breakfast. Levin Erp, PA Taking Active     Discontinued 10/11/19 1240 Probiotic Product (PROBIOTIC PO) [id number] Yes Take 1 capsule by mouth daily. [provider] Taking Active Self            There are no problems to display for this patient.   Conditions to be addressed/monitored  per PCP order:   Stress  Care Plan : LCSW Plan of Care  Updates made by Greg Cutter, LCSW since 09/06/2022 12:00 AM     Problem: Stress concerns      Long-Range Goal: Stress Symptoms Identified   Start Date: 12/13/2021   Priority: High  Note:   Priority: High  Timeframe:  Long-Range Goal Priority:  High Start Date:   12/13/21               Expected End Date:  ongoing  - consider counseling or psychiatry, read email explaining the difference between the two -consider bumping up your self-care  -consider creating a stronger support network  Why is this important?             Beating depression/stress may take some time.            If you don't feel better right away, don't give up on your treatment plan.    Current barriers:   Chronic Mental Health needs related to stress. Patient requires Support, Education, Resources, Referrals, Advocacy, and Care Coordination, in order to meet Unmet Mental Health Needs. Patient will implement clinical interventions discussed today to decrease symptoms of stress and increase knowledge and/or ability of: coping skills. Mental Health Concerns and Social Isolation Patient lacks knowledge of available community counseling agencies and resources.  Clinical Goal(s): verbalize understanding of plan for management of Stress and demonstrate a reduction in symptoms. Patient will consider RE-connecting with a provider for ongoing mental health treatment, increase coping skills, healthy habits, self-management skills, and stress reduction        Clinical Interventions:  Assessed patient's previous and current treatment, coping skills, support system and barriers to care.  Verbalization of feelings encouraged, motivational interviewing employed Emotional support provided, positive coping strategies explored Self care/establishing healthy boundaries emphasized Patient reports that she does not know what counseling and psychiatry is. [facility] LCSW educated her on this. Email was sent to patient on 12/13/21 with the explanation of these services as well. Patient wants to consider these resources first before agreeing to a referral. UPDATE 12/27/21- Patient is interested in gaining counseling  only. Referral made to [facility]. New emails were sent to patient with information on [facility].  [facility] LCSW sent list of coping skills for stress management in email as well for her to review and consider implementing.  LCSW provided education on relaxation techniques such as meditation, deep breathing, massage, grounding exercises or yoga that can activate the body's relaxation response and ease symptoms of stress. LCSW ask that when pt is struggling with difficult emotions and racing thoughts that they start this relaxation response process.  Patient will work on implementing appropriate self-care habits into their daily routine such as: staying positive, writing a gratitude list, drinking water, staying active around the house, taking their medications as prescribed, combating negative thoughts or emotions and staying connected with their family and friends. Positive reinforcement provided for this decision to work on this. Motivational Interviewing employed Depression screen reviewed  Mindfulness or Relaxation training provided Active listening / Reflection utilized  Emotional Support Provided Problem [place] strategies reviewed Provided psychoeducation for mental health needs  Provided brief CBT  Reviewed mental health medications and discussed importance of compliance:  Quality of sleep assessed & Sleep Hygiene techniques promoted  Participation in counseling encouraged  Verbalization of feelings encouraged  Suicidal Ideation/Homicidal Ideation assessed: Patient denies SI/HI  Review resources, discussed options and provided patient information about  Mental Health Resource  Education Inter-disciplinary care team collaboration (see longitudinal plan of care)   UPDATE 02/08/22- Patient has been successfully established with a mental health counselor at [facility]. She has already met with counselor and completed her intake. Patient will start therapy next month. She has two future counseling  appointments scheduled. Patient denies any future mental health concerns and is agreeable to social work discharge at this time. [place] LCSW will update [facility] RNCM and close case as goal is now met. 08/09/22 update- [place] LCSW was asked to get back involved with patient as she has contacted [facility] several times in order to reschedule her last mental health appointment and never received a return call back. [facility] LCSW and patient completed joint call to [facility] and scheduled her for a telephonic therapy session on 09/01/22 at 10 am. Email sent to patient. [facility] LCSW provided brief self-care education. [facility] LCSW will follow up next month. 08/25/22 LCSW update- Patient reports stable transportation to her upcoming initial therapy session next Friday at 10 am. She reports that she will notify [facility] LCSW if she has any concerns related to start back therapy. [facility] LCSW will follow up within two weeks to ensure her mental health needs were met. Brief self-care education provided. [facility] LCSW 09/06/22 update- Patient reports that she was unable to attend her scheduled counseling appointment last week because she was in class but did get on the video call and inform her therapist that she would need to reschedule. Patient has not rescheduled appointment but wrote down the number for [facility] to do so. Patient was provided education on crisis support resources.   Patient Goals/Self-Care Activities: Over the next 120 days Attend scheduled medical appointments Utilize healthy coping skills and supportive resources discussed Contact PCP with any questions or concerns Consider gaining counseling and psychiatry services Implement self-care into your daily routine Call your insurance provider for more information about your Enhanced Benefits  Check out counseling resources provided  Begin personal counseling with LCSW, to reduce and manage symptoms of Depression and Stress, until well-established with mental health provider Incorporate into daily  practice - relaxation techniques, deep breathing exercises, and mindfulness meditation strategies. Talk about feelings with friends, family members, spiritual advisor, etc. Contact LCSW directly [phone]), if you have questions, need assistance, or if additional social work needs are identified between now and our next scheduled telephone outreach call. Call 988 for mental health hotline/crisis line if needed (24/7 available) Try techniques to reduce symptoms of anxiety/negative thinking (deep breathing, distraction, positive self talk, etc)  - develop a personal safety plan - develop a plan to deal with triggers like holidays, anniversaries - exercise at least 2 to 3 times per week - have a plan for how to handle bad days - journal feelings and what helps to feel better or worse - spend time or talk with others at least 2 to 3 times per week - watch for early signs of feeling worse - begin personal counseling - call and visit an old friend - check out volunteer opportunities - join a support group - laugh; watch a funny movie or comedian - learn and use visualization or guided imagery - perform a random act of kindness - practice relaxation or meditation daily - start or continue a personal journal - practice positive thinking and self-talk -continue with compliance of taking medication  -identify current effective and ineffective coping strategies.  -implement positive self-talk in care to increase self-esteem, confidence and feelings of control.  -consider alternative and complementary therapy  approaches such as meditation, mindfulness or yoga.  -journaling, prayer, worship services, meditation or pastoral counseling.  -increase participation in pleasurable group activities such as hobbies, singing, sports or volunteering).  -consider the use of meditative movement therapy such as tai chi, yoga or qigong.  -start a regular daily exercise program based on tolerance, ability and  patient choice to support positive thinking and activity     Patient Goals: Follow up goal      Follow up:  Patient agrees to Care Plan and Follow-up.  Plan: The Managed Medicaid care management team will reach out to the patient again over the next 30 days.  Date/time of next scheduled Social Work care management/care coordination outreach:  09/20/22 at 1245 pm.  Eula Fried, [place], MSW, [place] Medicaid LCSW [email or web address] Phone: [phone]

## 2022-09-20 ENCOUNTER — Other Ambulatory Visit: Payer: Medicaid Other | Admitting: Licensed Clinical Social Worker

## 2022-09-20 NOTE — Patient Instructions (Signed)
Lacey Perez ,   [facility] Managed Care Team is available to provide assistance to you with your healthcare needs at no cost and as a benefit of your [facility] plan. I'm sorry I was unable to reach you today for our scheduled appointment. Our care guide will call you to reschedule our telephone appointment. Please call me at the number below. I am available to be of assistance to you regarding your healthcare needs. .   Thank you,   Eula Fried, BSW, MSW, LCSW Managed Medicaid LCSW [email or web address] Phone: [phone]

## 2022-09-20 NOTE — Patient Outreach (Signed)
  Medicaid Managed Care   Unsuccessful Attempt Note   09/20/2022 Name: Lacey Perez MRN: [id number] DOB: 18-Oct-2000  Referred by: [facility] Reason for referral : No chief complaint on file.   An unsuccessful telephone outreach was attempted today. The patient was referred to the case management team for assistance with care management and care coordination.    Follow Up Plan: A HIPAA compliant phone message was left for the patient providing contact information and requesting a return call.   Eula Fried, BSW, MSW, [employer] Managed Medicaid LCSW [email or web address] Phone: [phone]

## 2022-09-26 ENCOUNTER — Telehealth: Payer: Self-pay

## 2022-09-26 NOTE — Telephone Encounter (Signed)
..   Medicaid Managed Care   Unsuccessful Outreach Note  09/26/2022 Name: Lacey Perez MRN: [id number] DOB: [birth year]  Referred by: [facility] Reason for referral : Appointment (I called the patient today to reschedule her missed phone appt with the MM LCSW. I left my name and number on her VM.)   Third unsuccessful telephone outreach was attempted today. The patient was referred to the case management team for assistance with care management and care coordination. The patient's primary care provider has been notified of our unsuccessful attempts to make or maintain contact with the patient. The care management team is pleased to engage with this patient at any time in the future should he/she be interested in assistance from the care management team.   Follow Up Plan: We have been unable to make contact with the patient for follow up. The care management team is available to follow up with the patient after provider conversation with the patient regarding recommendation for care management engagement and subsequent re-referral to the care management team.  A HIPAA compliant phone message was left for the patient providing contact information and requesting a return call.   [place]

## 2022-09-29 ENCOUNTER — Ambulatory Visit: Payer: Medicaid Other | Admitting: Physician Assistant

## 2022-09-29 ENCOUNTER — Encounter: Payer: Self-pay | Admitting: Physician Assistant

## 2022-09-29 VITALS — BP 118/80 | HR 80 | Ht 66.0 in | Wt 146.8 lb

## 2022-09-29 DIAGNOSIS — K5100 Ulcerative (chronic) pancolitis without complications: Secondary | ICD-10-CM | POA: Diagnosis not present

## 2022-09-29 NOTE — Progress Notes (Signed)
Chief Complaint: Follow up Ulcerative Colitis  Review of pertinent gastrointestinal problems: 1.  Indeterminate colitis (left-sided segmental).  Diarrhea, minor bleeding led to colonoscopy 06/2021 terminal ileum not intubated due to tortuosity, no evidence of strictures or inflammation.  Right colon was normal, segmental inflammation in the left colon including distal rectum and also for a 20 cm segment in the sigmoid.  Multiple biopsies taken from throughout and path showed chronic inflammation in the left sided segmental changes.  Lialda 4 pills once daily started, also Rowasa enema once daily started.  CT enteroscopy 10/2021 "severe circumferential wall thickening and hyperenhancement involving the descending colon through the rectum." Labs 10/2021: Hepatitis B surface antigen negative, Quantifuron gold TB testing negative, hepatitis B surface Humira new start 12/2021: Personally wanted to go back on Lialda 4 tabs a day and did this in November 2023, has been maintained on that since and doing well 2.  Acute norovirus infection 10/2021  HPI:    Lacey Perez is a 22 year old female with a past medical history as listed below including ulcerative colitis, assigned to Dr. Rush Landmark (in lieu of Dr. Ardis Hughs absence), who presents to clinic today for follow-up of her ulcerative colitis.    Today, the patient tells me that she is surprised that she is doing so well.  She started just the Lialda 4 tabs a day back in November after seeing me and has stopped her Humira altogether.  Tells me she still has some at home though.  She has daily soft solid bowel movements and occasionally has to take a laxative because she has not had a bowel movement in a few days.  Things are going well.  Reminds me that when she was on Lialda before she was not taking this every day just here and there and now she is religious about doing this.  She feels like taking this medicine every day helps her.  She would like to stay on  it.    Currently going through nursing school, just started in the spring.  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    Denies fever, chills, weight loss, blood in her stool, nausea, vomiting, heartburn or reflux.  Past Medical History:  Diagnosis Date   Asthma    UC (ulcerative colitis) ([place])     Past Surgical History:  Procedure Laterality Date   TONSILLECTOMY AND ADENOIDECTOMY     WISDOM TOOTH EXTRACTION      Current Outpatient Medications  Medication Sig Dispense Refill   Adalimumab (HUMIRA PEN) 40 MG/0.4ML PNKT Inject 1 pen. into the skin every 14 (fourteen) days. 2 each 6   azathioprine (IMURAN) 100 MG tablet Take 1.5 tablets (150 mg total) by mouth daily. 45 tablet 3   fluticasone (FLONASE) 50 MCG/ACT nasal spray Place 2 sprays into both nostrils daily. (Patient taking differently: Place 2 sprays into both nostrils daily as needed for allergies.) 16 g 0   mesalamine (LIALDA) 1.2 g EC tablet Take 4 tablets (4.8 g total) by mouth daily with breakfast. 120 tablet 11   Probiotic Product (PROBIOTIC PO) Take 1 capsule by mouth daily.     No current facility-administered medications for this visit.    Allergies as of 09/29/2022   (No Known Allergies)    Family History  Problem Relation Age of Onset   Breast cancer Paternal Grandmother    Diabetes Paternal Grandmother    Colon polyps Maternal Grandmother    Asthma Father    Diabetes Mother  Hypertension Mother    Asthma Mother    Fibroids Mother    Asthma Sister    Colon cancer Neg Hx    Esophageal cancer Neg Hx    Rectal cancer Neg Hx    Stomach cancer Neg Hx     Social History   Socioeconomic History   Marital status: Single    Spouse name: Not on file   Number of children: 0   Years of education: Not on file   Highest education level: Not on file  Occupational History   Not on file  Tobacco Use   Smoking status: Never   Smokeless tobacco: Never  Vaping Use   Vaping Use: Every day    Substances: Nicotine, Flavoring  Substance and Sexual Activity   Alcohol use: Not Currently   Drug use: Never   Sexual activity: Yes    Birth control/protection: None  Other Topics Concern   Not on file  Social History Narrative   Not on file   Social Determinants of Health   Financial Resource Strain: Not on file  Food Insecurity: No Food Insecurity (12/07/2021)   Hunger Vital Sign    Worried About Running Out of Food in the Last Year: Never true    Ran Out of Food in the Last Year: Never true  Transportation Needs: No Transportation Needs (08/07/2022)   PRAPARE - Hydrologist (Medical): No    Lack of Transportation (Non-Medical): No  Physical Activity: Not on file  Stress: Stress Concern Present (09/06/2022)   [place]    Feeling of Stress : Rather much  Social Connections: Not on file  Intimate Partner Violence: Not on file    Review of Systems:    Constitutional: No weight loss, fever or chills Cardiovascular: No chest pain Respiratory: No SOB Gastrointestinal: See HPI and otherwise negative   Physical Exam:  Vital signs: BP 118/80   Pulse 80   Ht '5\' 6"'$  (1.676 m)   Wt 146 lb 12.8 oz (66.6 kg)   SpO2 98%   BMI 23.69 kg/m    Constitutional:   Pleasant AA female appears to be in NAD, Well developed, Well nourished, alert and cooperative Respiratory: Respirations even and unlabored. Lungs clear to auscultation bilaterally.   No wheezes, crackles, or rhonchi.  Cardiovascular: Normal S1, S2. No MRG. Regular rate and rhythm. No peripheral edema, cyanosis or pallor.  Gastrointestinal:  Soft, nondistended, nontender. No rebound or guarding. Normal bowel sounds. No appreciable masses or hepatomegaly. Rectal:  Not performed.  Psychiatric: Oriented to person, place and time. Demonstrates good judgement and reason without abnormal affect or behaviors.  RELEVANT LABS AND  IMAGING: CBC    Component Value Date/Time   WBC 7.6 04/13/2022 0929   RBC 4.12 04/13/2022 0929   HGB 13.7 04/13/2022 0929   HCT 41.6 04/13/2022 0929   PLT 319.0 04/13/2022 0929   MCV 101.0 (H) 04/13/2022 0929   MCH 34.7 (H) 05/27/2021 0820   MCHC 32.9 04/13/2022 0929   RDW 13.4 04/13/2022 0929   LYMPHSABS 3.6 04/13/2022 0929   MONOABS 1.0 04/13/2022 0929   EOSABS 0.4 04/13/2022 0929   BASOSABS 0.1 04/13/2022 0929    CMP     Component Value Date/Time   NA 140 04/13/2022 0929   K 4.0 04/13/2022 0929   CL 104 04/13/2022 0929   CO2 28 04/13/2022 0929   GLUCOSE 76 04/13/2022 0929   BUN 6 04/13/2022 0929  CREATININE 0.70 04/13/2022 0929   CALCIUM 9.2 04/13/2022 0929   PROT 7.1 04/13/2022 0929   ALBUMIN 4.0 04/13/2022 0929   AST 11 04/13/2022 0929   ALT 10 04/13/2022 0929   ALKPHOS 38 (L) 04/13/2022 0929   BILITOT 0.2 04/13/2022 0929   GFRNONAA >60 05/27/2021 0820    Assessment: 1.  Ulcerative colitis: Patient stopped her Humira and went back to the Lialda 1.2 g, 4 tabs daily and is doing well  Plan: 1.  Patient can continue Lialda 1.2 g, 4 tabs daily for now.  She tells me she has enough of this medication at home. 2.  Patient will need repeat labs in 6 months for medication maintenance and will need further discussion of repeat colonoscopy for surveillance. 3.  I would like her to come back in 6 months and see Dr. Rush Landmark her new physician here as she has never met him.  At that time further labs and maintenance can be done for her ulcerative colitis.  Did tell her to call us if she feels like she is having a flare between then and now.  Ellouise Newer, PA-C [facility] 09/29/2022, 10:07 AM  Cc: [place]*

## 2022-09-29 NOTE — Progress Notes (Signed)
Attending Physician's Attestation   I have reviewed the chart.   I agree with the Advanced Practitioner's note, impression, and recommendations with any updates as below. I am happy to hear how she is doing with the [place] therapy.   Justice Britain, MD [facility] Advanced [facility]oscopy Office # [phone]

## 2022-09-29 NOTE — Patient Instructions (Signed)
_______________________________________________________  If your blood pressure at your visit was 140/90 or greater, please contact your primary care physician to follow up on this.  _______________________________________________________  If you are age 24 or older, your body mass index should be between 23-30. Your Body mass index is 23.69 kg/m. If this is out of the aforementioned range listed, please consider follow up with your Primary Care Provider.  If you are age 75 or younger, your body mass index should be between 19-25. Your Body mass index is 23.69 kg/m. If this is out of the aformentioned range listed, please consider follow up with your Primary Care Provider.   ________________________________________________________  The [facility] providers would like to encourage you to use [facility] to communicate with providers for non-urgent requests or questions.  Due to long hold times on the telephone, sending your provider a message by [facility] may be a faster and more efficient way to get a response.  Please allow 48 business hours for a response.  Please remember that this is for non-urgent requests.  _______________________________________________________  Please follow up in 6 month with Dr. Rush Landmark.   Continue Lialda, 4 tablets a day.   It was a pleasure to see you today!  Thank you for trusting me with your gastrointestinal care!

## 2022-10-06 ENCOUNTER — Other Ambulatory Visit: Payer: Medicaid Other | Admitting: *Deleted

## 2022-10-06 NOTE — Patient Outreach (Signed)
Medicaid Managed Care   Nurse Care Manager Note  10/06/2022 Name:  Lacey Perez MRN:  [id number] DOB:  [birth year]  Lacey Perez is an 22 y.o. year old female who is a primary patient of [place]  [facility] Managed Care Coordination team was consulted for assistance with:    Ulcerative Colitis  Lacey Perez was given information about Medicaid Managed Care Coordination team services today. Lacey Perez Patient agreed to services and verbal consent obtained.  Engaged with patient by telephone for follow up visit in response to provider referral for case management and/or care coordination services.   Assessments/Interventions:  Review of past medical history, allergies, medications, health status, including review of consultants reports, laboratory and other test data, was performed as part of comprehensive evaluation and provision of chronic care management services.  SDOH (Social Determinants of Health) assessments and interventions performed: SDOH Interventions    Flowsheet Row Patient Outreach Telephone from 09/06/2022 in [place] Patient Outreach Telephone from 08/25/2022 in [place] Patient Outreach Telephone from 08/09/2022 in [place] Patient Outreach Telephone from 08/07/2022 in [place] Patient Outreach Telephone from 04/03/2022 in [place] Coordination Patient Outreach Telephone from 02/08/2022 in [place] Coordination  SDOH Interventions        Housing Interventions -- -- -- Intervention Not Indicated Intervention Not Indicated --  Transportation Interventions -- -- -- Intervention Not Indicated Intervention Not Indicated --  Utilities Interventions -- -- -- Intervention Not Indicated Intervention Not Indicated --  Depression Interventions/Treatment  -- --  Counseling -- -- --  Stress Interventions [place], Provide Counseling  [facility] LCSW reconnected pt with her counselor Lacey Perez at [place]  No Known Allergies  Medications Reviewed Today     Reviewed by Lacey Montane, RN (Registered Nurse) on 10/06/22 at 1452  Med List Status: <None>   Medication Order Taking? Sig Documenting Provider Last Dose Status Informant  Adalimumab (HUMIRA PEN) 40 MG/0.4ML PNKT [id number] No Inject 1 pen. into the skin every 14 (fourteen) days.  Patient not taking: Reported on 10/06/2022   Milus Banister, MD Not Taking Active            Med Note [name] Feb 02, 2022 10:27 AM)      Discontinued 10/11/19 1240 azathioprine (IMURAN) 100 MG tablet [id number] Yes Take 1.5 tablets (150 mg total) by mouth daily. Milus Banister, MD Taking Active   fluticasone [facility]) 50 MCG/ACT nasal spray [id number] Yes Place 2 sprays into both nostrils daily.  Patient taking differently: Place 2 sprays into both nostrils daily as needed for allergies.   Melynda Ripple, MD Taking Active     Discontinued 10/11/19 1240   Discontinued 06/13/20 1454   mesalamine (LIALDA) 1.2 g EC tablet [id number] Yes Take 4 tablets (4.8 g total) by mouth daily with breakfast. Levin Erp, PA Taking Active     Discontinued 10/11/19 1240 Probiotic Product (PROBIOTIC PO) [id number] Yes Take 1 capsule by mouth daily. [provider] Taking Active Self            There are no problems to display for this patient.   Conditions to be addressed/monitored per PCP order:   Ulcerative Colitis  Care Plan :  RN Care Manager Plan of Care  Updates made by Lacey Montane, RN since 10/06/2022 12:00 AM  Completed 10/06/2022   Problem: Health Management needs related to Ulcerative  Colitis Resolved 10/06/2022     Long-Range Goal: Development of Plan of Care to address Health Management needs related to Ulcerative Colitis Completed 10/06/2022  Start Date: 12/07/2021  Expected End Date: 10/20/2022  Priority: High  Note:   Current Barriers:  Chronic Disease Management support and education needs related to Ulcerative Colitis  [name] reports improvement in her health. She has established care with new PCP with [facility]. She has switched to taking Lialda and feels her IBS flares are much shorter in duration.  RNCM Clinical Goal(s):  Patient will verbalize understanding of plan for management of Ulcerative Colitis as evidenced by patient reports take all medications exactly as prescribed and will call provider for medication related questions as evidenced by patient reports and documentation in EMR    attend all scheduled medical appointments: 09/07/22 with new PCP and scheduling follow up with [facility] as evidenced by provider documentation in EMR        work with Education officer, museum to address [place] Concerns  related to the management of Ulcerative Colitis as evidenced by review of EMR and patient or social worker report     through collaboration with Consulting civil engineer, provider, and care team.   Interventions: Inter-disciplinary care team collaboration (see longitudinal plan of care) Evaluation of current treatment plan related to  self management and patient's adherence to plan as established by provider  Ulcerative Colitis  (Status: Goal Met.) Long Term Goal  Evaluation of current treatment plan related to  Ulcerative Colitis ,  self-management and patient's adherence to plan as established by provider. Discussed plans with patient for ongoing care management follow up and provided patient with direct contact information for care management team Advised patient to journal her symptoms and pain to track triggers; Reviewed medications with patient and  discussed humira and its side effects; Reviewed scheduled/upcoming provider appointments including new PCP on 09/07/22; Reviewed provider notes from recent GI visit Advised patient to contact GI provider with any new flare up Collaborated with LCSW, scheduled telephone outreach on 08/09/22  Provided education on managing stress  Patient Goals/Self-Care Activities: Take medications as prescribed   Attend all scheduled provider appointments Call provider office for new concerns or questions  Work with LCSW for managing stress       Follow Up:  Patient agrees to Care Plan and Follow-up.  Plan: The  Patient has been provided with contact information for the Managed Medicaid care management team and has been advised to call with any health related questions or concerns.  Date/time of next scheduled RN care management/care coordination outreach:  no follow up  [name] RN, [place] RN Care Coordinator

## 2022-10-26 ENCOUNTER — Ambulatory Visit
Admission: EM | Admit: 2022-10-26 | Discharge: 2022-10-26 | Disposition: A | Discharge status: Home / Self Care | Payer: Medicaid Other | Attending: Urgent Care | Admitting: Urgent Care

## 2022-10-26 DIAGNOSIS — Z1152 Encounter for screening for COVID-19: Secondary | ICD-10-CM | POA: Insufficient documentation

## 2022-10-26 DIAGNOSIS — Z0189 Encounter for other specified special examinations: Secondary | ICD-10-CM | POA: Insufficient documentation

## 2022-10-26 DIAGNOSIS — B349 Viral infection, unspecified: Secondary | ICD-10-CM

## 2022-10-26 DIAGNOSIS — D849 Immunodeficiency, unspecified: Secondary | ICD-10-CM | POA: Insufficient documentation

## 2022-10-26 LAB — POCT INFLUENZA A/B
Influenza A, POC: NEGATIVE
Influenza B, POC: NEGATIVE

## 2022-10-26 MED ORDER — CETIRIZINE HCL 10 MG PO TABS: 10.0000 mg | ORAL_TABLET | Freq: Every day | ORAL | 0 refills | Status: AC

## 2022-10-26 MED ORDER — PSEUDOEPHEDRINE HCL 60 MG PO TABS: 60.0000 mg | ORAL_TABLET | Freq: Three times a day (TID) | ORAL | 0 refills | Status: DC | PRN

## 2022-10-26 MED ORDER — ACETAMINOPHEN 325 MG PO TABS: 650.0000 mg | ORAL_TABLET | Freq: Four times a day (QID) | ORAL | 0 refills | Status: AC | PRN

## 2022-10-26 MED ORDER — PROMETHAZINE-DM 6.25-15 MG/5ML PO SYRP: 5.0000 mL | ORAL_SOLUTION | Freq: Three times a day (TID) | ORAL | 0 refills | Status: DC | PRN

## 2022-10-26 NOTE — ED Triage Notes (Signed)
Pt reports sore throat, headache and nasal congestion x 1 day.   Pt requested Flu and COVID test.

## 2022-10-26 NOTE — Discharge Instructions (Signed)
We will notify you of your test results as they arrive and may take between about 24 hours.  I encourage you to sign up for MyChart if you have not already done so as this can be the easiest way for us to communicate results to you online or through a phone app.  Generally, we only contact you if it is a positive test result.  In the meantime, if you develop worsening symptoms including fever, chest pain, shortness of breath despite our current treatment plan then please report to the emergency room as this may be a sign of worsening status from possible viral infection.  Otherwise, we will manage this as a viral syndrome. For sore throat or cough try using a honey-based tea. Use 3 teaspoons of honey with juice squeezed from half lemon. Place shaved pieces of ginger into 1/2-1 cup of water and warm over stove top. Then mix the ingredients and repeat every 4 hours as needed. Please take Tylenol 500mg-650mg every 6 hours for aches and pains, fevers. Hydrate very well with at least 2 liters of water. Eat light meals such as soups to replenish electrolytes and soft fruits, veggies. Start an antihistamine like Zyrtec (10mg daily) for postnasal drainage, sinus congestion.  You can take this together with pseudoephedrine (Sudafed) at a dose of 60 mg 2-3 times a day as needed for the same kind of congestion.  Use the cough medications as needed.   

## 2022-10-26 NOTE — ED Provider Notes (Addendum)
[name]RG[facility]  Note:  This document was prepared using Systems analyst and may include unintentional dictation errors.  MRN: [id number] DOB: [birth year]  Subjective:   Lacey Perez is a 22 y.o. female presenting for 1 day history of acute onset sinus headaches, sinus congestion, throat pain.  No fever, body pains, ear pain, cough, chest pain, shortness of breath or wheezing.  Has a history of ulcerative colitis, is Humira and Imuran. Quit vaping. Has a history of asthma but doesn't have any difficulty with it.  Patient is in nursing school.  No current facility-administered medications for this encounter.  Current Outpatient Medications:    Adalimumab (HUMIRA PEN) 40 MG/0.4ML PNKT, Inject 1 pen. into the skin every 14 (fourteen) days. (Patient not taking: Reported on 10/06/2022), Disp: 2 each, Rfl: 6   azathioprine (IMURAN) 100 MG tablet, Take 1.5 tablets (150 mg total) by mouth daily., Disp: 45 tablet, Rfl: 3   fluticasone (FLONASE) 50 MCG/ACT nasal spray, Place 2 sprays into both nostrils daily. (Patient taking differently: Place 2 sprays into both nostrils daily as needed for allergies.), Disp: 16 g, Rfl: 0   mesalamine (LIALDA) 1.2 g EC tablet, Take 4 tablets (4.8 g total) by mouth daily with breakfast., Disp: 120 tablet, Rfl: 11   Probiotic Product (PROBIOTIC PO), Take 1 capsule by mouth daily., Disp: , Rfl:    No Known Allergies  Past Medical History:  Diagnosis Date   Asthma    UC (ulcerative colitis)      Past Surgical History:  Procedure Laterality Date   TONSILLECTOMY AND ADENOIDECTOMY     WISDOM TOOTH EXTRACTION      Family History  Problem Relation Age of Onset   Breast cancer Paternal Grandmother    Diabetes Paternal Grandmother    Colon polyps Maternal Grandmother    Asthma Father    Diabetes Mother    Hypertension Mother    Asthma Mother    Fibroids Mother    Asthma Sister    Colon cancer Neg Hx    Esophageal  cancer Neg Hx    Rectal cancer Neg Hx    Stomach cancer Neg Hx     Social History   Tobacco Use   Smoking status: Never   Smokeless tobacco: Never  Vaping Use   Vaping Use: Every day   Substances: Nicotine, Flavoring  Substance Use Topics   Alcohol use: Not Currently   Drug use: Never    ROS   Objective:   Vitals: BP 117/79 (BP Location: Right Arm)   Pulse 77   Temp 97.9 F (36.6 C) (Oral)   Resp 18   LMP 10/19/2022 (Exact Date)   SpO2 98%   Physical Exam Constitutional:      General: She is not in acute distress.    Appearance: Normal appearance. She is well-developed and normal weight. She is not ill-appearing, toxic-appearing or diaphoretic.  HENT:     Head: Normocephalic and atraumatic.     Right Ear: Tympanic membrane, ear canal and external ear normal. No drainage or tenderness. No middle ear effusion. There is no impacted cerumen. Tympanic membrane is not erythematous or bulging.     Left Ear: Tympanic membrane, ear canal and external ear normal. No drainage or tenderness.  No middle ear effusion. There is no impacted cerumen. Tympanic membrane is not erythematous or bulging.     Nose: Congestion present. No rhinorrhea.     Mouth/Throat:     Mouth: Mucous membranes  are moist. No oral lesions.     Pharynx: No pharyngeal swelling, oropharyngeal exudate, posterior oropharyngeal erythema or uvula swelling.     Tonsils: No tonsillar exudate or tonsillar abscesses.     Comments: Thick wall of postnasal drainage overlying the pharynx. Eyes:     General: No scleral icterus.       Right eye: No discharge.        Left eye: No discharge.     Extraocular Movements: Extraocular movements intact.     Right eye: Normal extraocular motion.     Left eye: Normal extraocular motion.     Conjunctiva/sclera: Conjunctivae normal.  Cardiovascular:     Rate and Rhythm: Normal rate and regular rhythm.     Heart sounds: Normal heart sounds. No murmur heard.    No friction rub.  No gallop.  Pulmonary:     Effort: Pulmonary effort is normal. No respiratory distress.     Breath sounds: No stridor. No wheezing, rhonchi or rales.  Chest:     Chest wall: No tenderness.  Musculoskeletal:     Cervical back: Normal range of motion and neck supple.  Lymphadenopathy:     Cervical: No cervical adenopathy.  Skin:    General: Skin is warm and dry.  Neurological:     General: No focal deficit present.     Mental Status: She is alert and oriented to person, place, and time.  Psychiatric:        Mood and Affect: Mood normal.        Behavior: Behavior normal.    Results for orders placed or performed during the hospital encounter of 10/26/22 (from the past 24 hour(s))  POCT Influenza A/B     Status: None   Collection Time: 10/26/22  3:08 PM  Result Value Ref Range   Influenza A, POC Negative Negative   Influenza B, POC Negative Negative    Assessment and Plan :   PDMP not reviewed this encounter.  1. Acute viral syndrome   2. Patient requested diagnostic testing   3. Immunocompromised     Deferred imaging given clear cardiopulmonary exam, hemodynamically stable vital signs. Will manage for viral illness such as viral URI, viral syndrome, viral rhinitis, COVID-19. Recommended supportive care. Offered scripts for symptomatic relief. Testing is pending. Counseled patient on potential for adverse effects with medications prescribed/recommended today, ER and return-to-clinic precautions discussed, patient verbalized understanding.   Patient is immunocompromised and absolutely should undergo COVID antiviral treatment should she test positive for this.   Jaynee Eagles, [place] 10/26/22 [id number]

## 2022-10-27 LAB — SARS CORONAVIRUS 2 (TAT 6-24 HRS): SARS Coronavirus 2: NEGATIVE

## 2022-11-03 ENCOUNTER — Telehealth: Payer: Medicaid Other | Admitting: Physician Assistant

## 2022-11-03 DIAGNOSIS — J028 Acute pharyngitis due to other specified organisms: Secondary | ICD-10-CM

## 2022-11-03 DIAGNOSIS — B9689 Other specified bacterial agents as the cause of diseases classified elsewhere: Secondary | ICD-10-CM | POA: Diagnosis not present

## 2022-11-03 MED ORDER — AMOXICILLIN 500 MG PO CAPS
500.0000 mg | ORAL_CAPSULE | Freq: Two times a day (BID) | ORAL | 0 refills | Status: AC
Start: 2022-11-03 — End: 2022-11-13

## 2022-11-03 MED ORDER — PROMETHAZINE-DM 6.25-15 MG/5ML PO SYRP: 5.0000 mL | ORAL_SOLUTION | Freq: Three times a day (TID) | ORAL | 0 refills | Status: DC | PRN

## 2022-11-03 NOTE — Addendum Note (Signed)
Addended by: Margaretann Loveless on: 11/03/2022 12:14 PM   Modules accepted: Orders

## 2022-11-03 NOTE — Progress Notes (Signed)
E-Visit for Sore Throat   We are sorry that you are not feeling well.  Here is how we plan to help!  Based on what you have shared with me it is likely that you have bacterial pharyngitis.  Bacterial pharyngitis is inflammation and infection in the back of the throat.  This is an infection cause by bacteria and is treated with antibiotics.  I have prescribed Amoxicillin 500 mg twice a day for 10 days. For throat pain, we recommend over the counter oral pain relief medications such as acetaminophen or aspirin, or anti-inflammatory medications such as ibuprofen or naproxen sodium. Topical treatments such as oral throat lozenges or sprays may be used as needed. Bacterial infections are not as easily transmitted as other respiratory infections, however we still recommend that you avoid close contact with loved ones, especially the very young and elderly.  Remember to wash your hands thoroughly throughout the day as this is the number one way to prevent the spread of infection and wipe down door knobs and counters with disinfectant.   Home Care: Only take medications as instructed by your medical team. Complete the entire course of an antibiotic. Do not take these medications with alcohol. A steam or ultrasonic humidifier can help congestion.  You can place a towel over your head and breathe in the steam from hot water coming from a faucet. Avoid close contacts especially the very young and the elderly. Cover your mouth when you cough or sneeze. Always remember to wash your hands.  Get Help Right Away If: You develop worsening fever or sinus pain. You develop a severe head ache or visual changes. Your symptoms persist after you have completed your treatment plan.  Make sure you Understand these instructions. Will watch your condition. Will get help right away if you are not doing well or get worse.   Thank you for choosing an e-visit.  Your e-visit answers were reviewed by a board certified  advanced clinical practitioner to complete your personal care plan. Depending upon the condition, your plan could have included both over the counter or prescription medications.  Please review your pharmacy choice. Make sure the pharmacy is open so you can pick up prescription now. If there is a problem, you may contact your provider through MyChart messaging and have the prescription routed to another pharmacy.  Your safety is important to us. If you have drug allergies check your prescription carefully.   For the next 24 hours you can use MyChart to ask questions about today's visit, request a non-urgent call back, or ask for a work or school excuse. You will get an email in the next two days asking about your experience. I hope that your e-visit has been valuable and will speed your recovery.  I have spent 5 minutes in review of e-visit questionnaire, review and updating patient chart, medical decision making and response to patient.   Pantera Winterrowd M Toia Micale, PA-C  

## 2022-11-16 ENCOUNTER — Other Ambulatory Visit (HOSPITAL_COMMUNITY): Payer: Self-pay

## 2023-01-05 IMAGING — CT CT ABD-PELV W/ CM
2 of 4 series · 16 of 46 positions shown, 18 images · IV contrast (Omni 300)
Comparison: None.

CLINICAL DATA: 20-year-old female with abdominal pain.

EXAM:
CT ABDOMEN AND PELVIS WITHOUT CONTRAST
TECHNIQUE: Multidetector CT imaging of the abdomen and pelvis was performed
following the standard protocol without IV contrast.

[Series 3: a/p w/ 5mm · axial · 0.66mm/px · z∈[+847,+1297]mm · 13 of 98 slices shown, 15 images]
[im 4/98  soft-tissue]
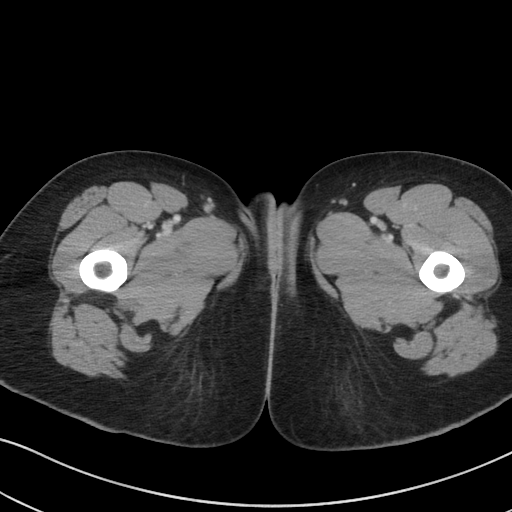
[im 4/98  bone]
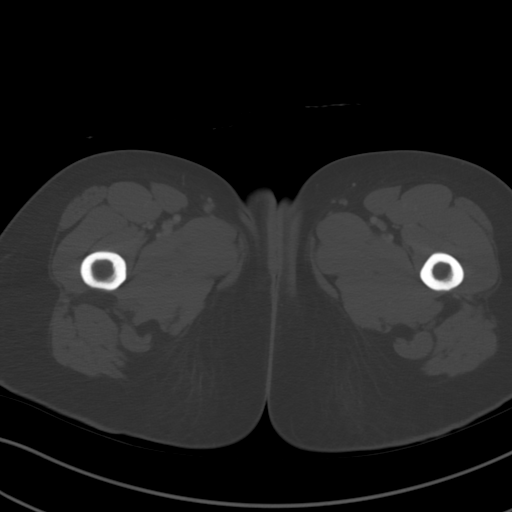
[im 12/98  soft-tissue]
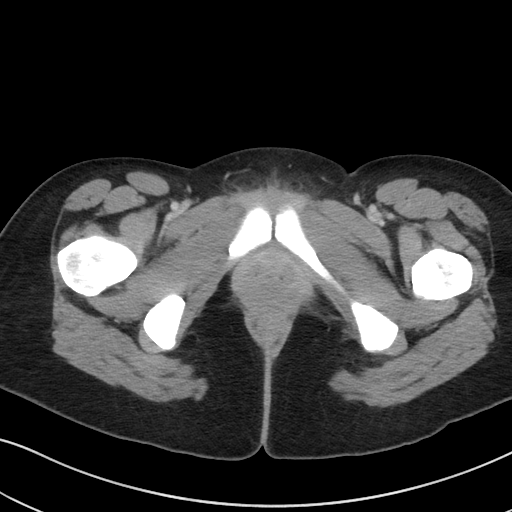
[im 19/98  soft-tissue]
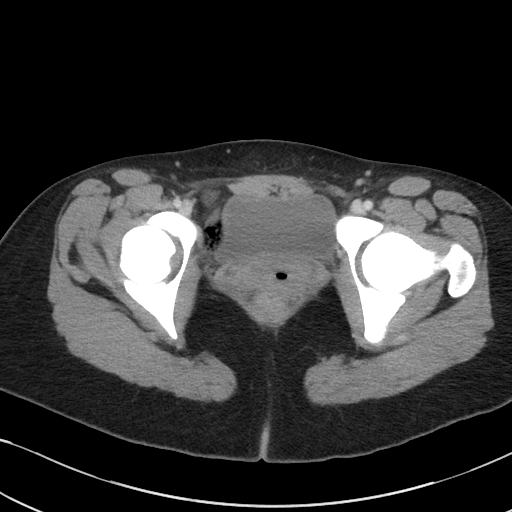
[im 27/98  soft-tissue]
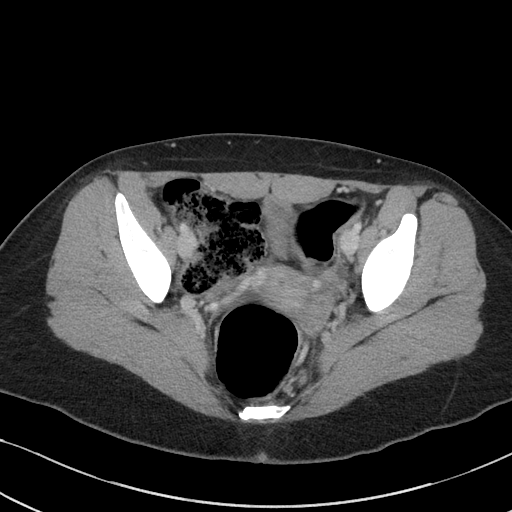
[im 34/98  soft-tissue]
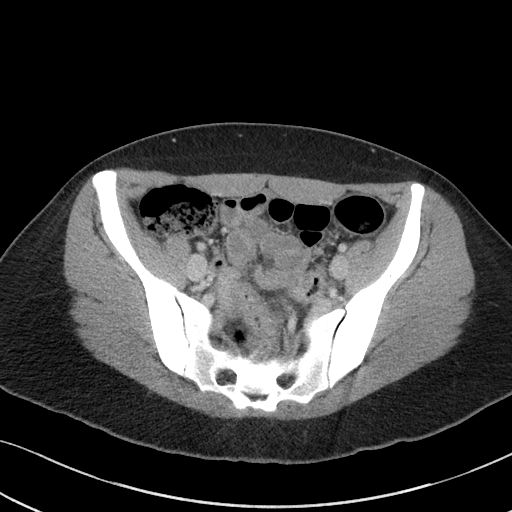
[im 42/98  soft-tissue]
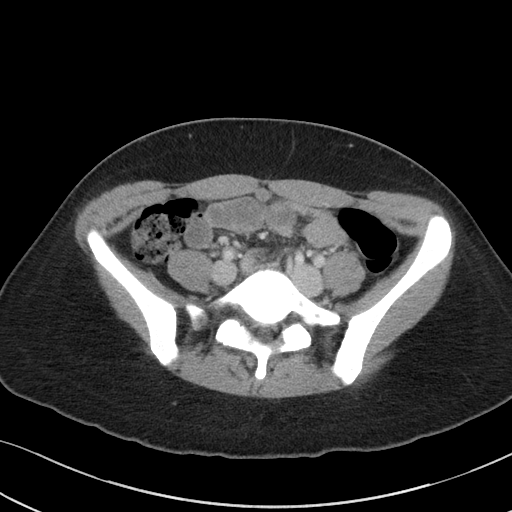
[im 49/98  soft-tissue]
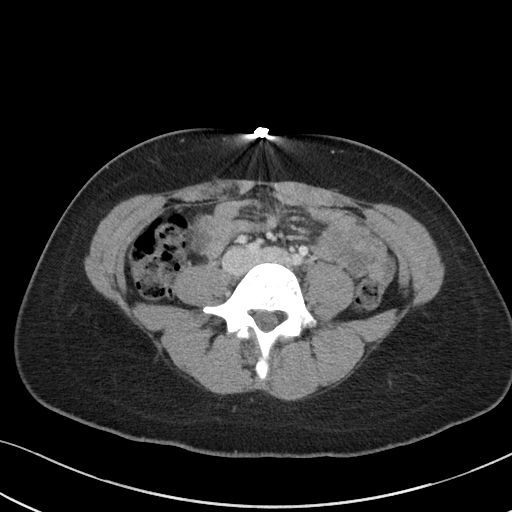
[im 56/98  soft-tissue]
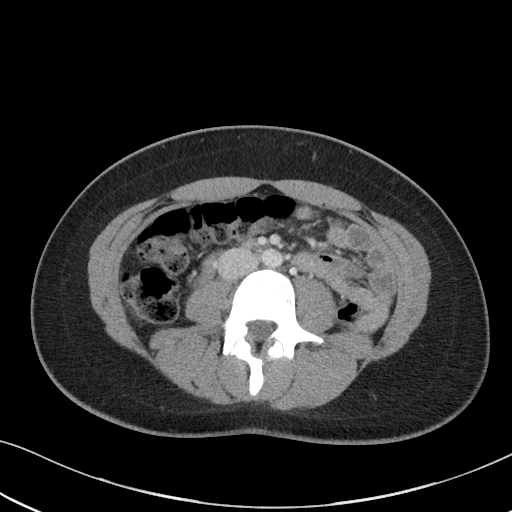
[im 64/98  soft-tissue]
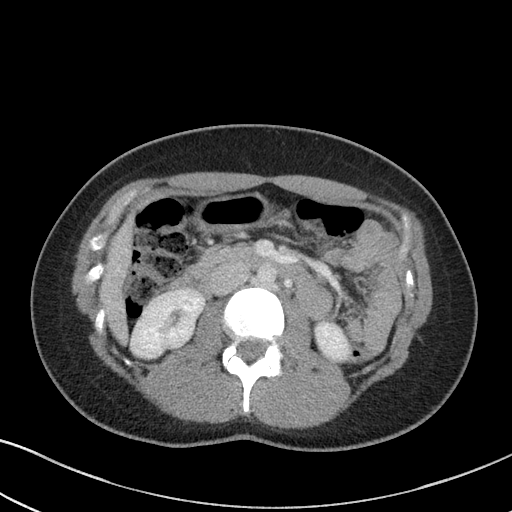
[im 64/98  bone]
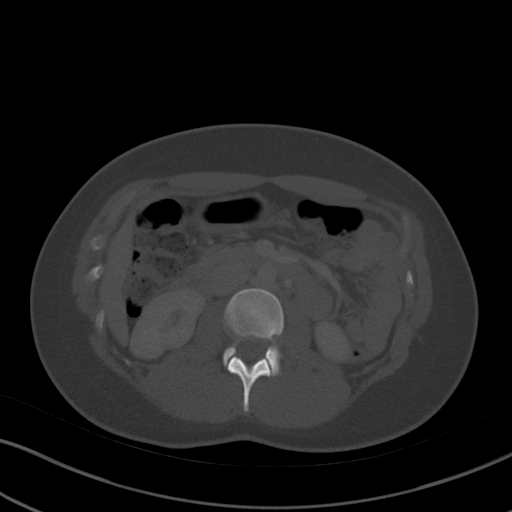
[im 71/98  soft-tissue]
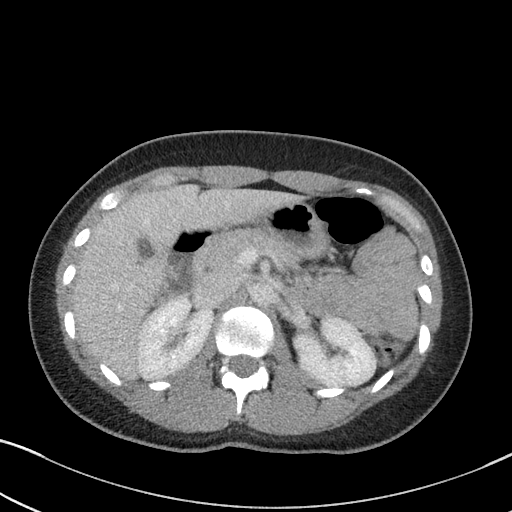
[im 79/98  soft-tissue]
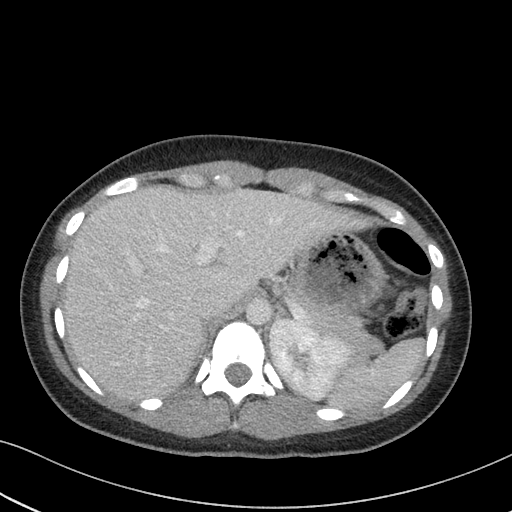
[im 86/98  soft-tissue]
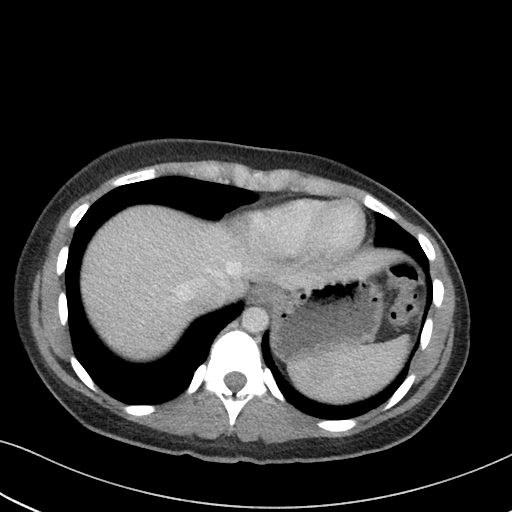
[im 94/98  soft-tissue]
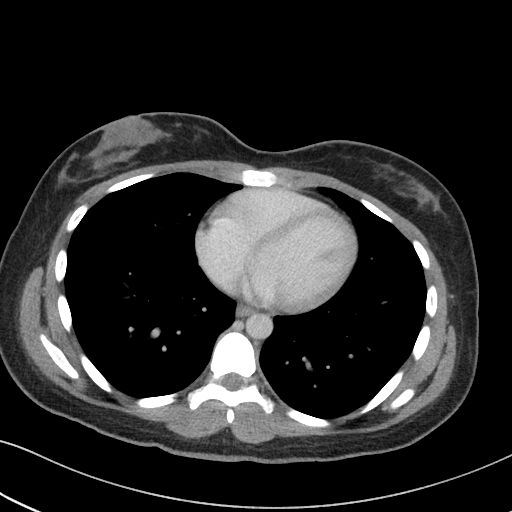

[Series 6: a/p w/ cor · coronal · 0.79mm/px · 3 of 150 slices shown]
[im 50/150  soft-tissue]
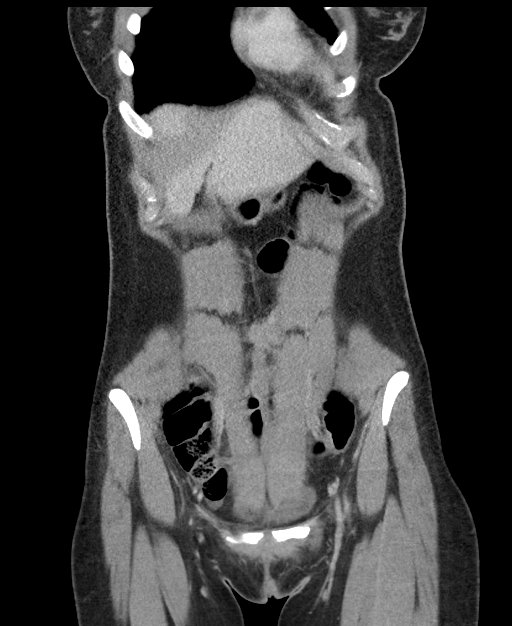
[im 67/150  soft-tissue]
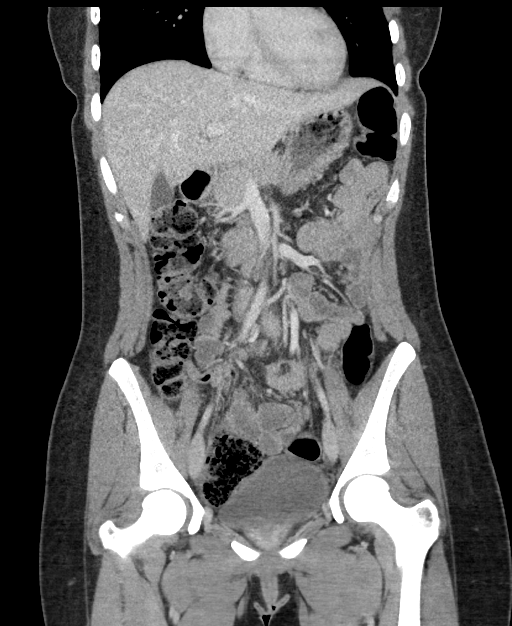
[im 83/150  soft-tissue]
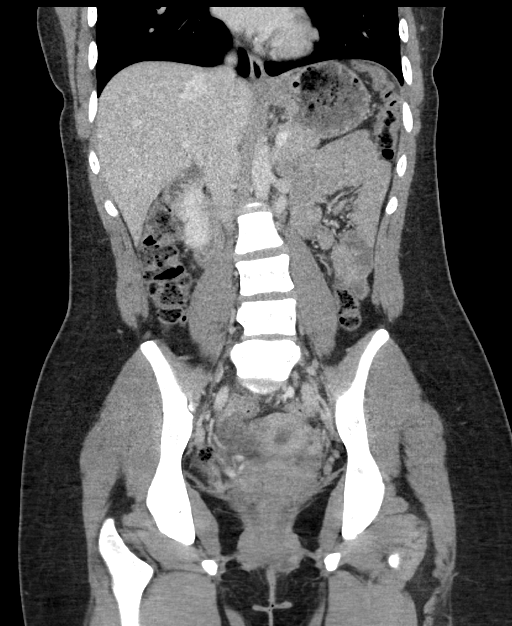

[16 of 46 positions shown; findings below may reference images not displayed]

FINDINGS: Lower chest: No acute abnormality.

Hepatobiliary: No focal liver abnormality is seen. No gallstones,
gallbladder wall thickening, or biliary dilatation.

Pancreas: Unremarkable. No pancreatic ductal dilatation or
surrounding inflammatory changes.

Spleen: Normal in size without focal abnormality.

Adrenals/Urinary Tract: Adrenal glands are unremarkable. Kidneys are
normal, without renal calculi, focal lesion, or hydronephrosis.
Bladder is unremarkable.

Stomach/Bowel: Stomach is within normal limits. Appendix appears
normal. No evidence of bowel wall thickening, distention, or
inflammatory changes.

Vascular/Lymphatic: No significant vascular findings are present. No
enlarged abdominal or pelvic lymph nodes.

Reproductive: Uterus and bilateral adnexa are unremarkable.

Other: No abdominal wall hernia or abnormality. No abdominopelvic
ascites.

Musculoskeletal: No acute or significant osseous findings.
IMPRESSION: No acute abdominopelvic abnormality.

## 2023-01-12 ENCOUNTER — Encounter (HOSPITAL_BASED_OUTPATIENT_CLINIC_OR_DEPARTMENT_OTHER): Payer: Self-pay

## 2023-01-12 ENCOUNTER — Ambulatory Visit (INDEPENDENT_AMBULATORY_CARE_PROVIDER_SITE_OTHER): Payer: Medicaid Other | Admitting: *Deleted

## 2023-01-12 ENCOUNTER — Other Ambulatory Visit (HOSPITAL_COMMUNITY)
Admission: RE | Admit: 2023-01-12 | Discharge: 2023-01-12 | Disposition: A | Discharge status: Home / Self Care | Payer: Medicaid Other | Source: Ambulatory Visit | Attending: Obstetrics & Gynecology | Admitting: Obstetrics & Gynecology

## 2023-01-12 VITALS — BP 120/84 | HR 73 | Ht 66.0 in | Wt 150.0 lb

## 2023-01-12 DIAGNOSIS — N898 Other specified noninflammatory disorders of vagina: Secondary | ICD-10-CM | POA: Diagnosis not present

## 2023-01-12 DIAGNOSIS — Z113 Encounter for screening for infections with a predominantly sexual mode of transmission: Secondary | ICD-10-CM | POA: Diagnosis not present

## 2023-01-12 NOTE — Progress Notes (Signed)
Pt here with complaints of vaginal discharge with itching. Pt also desires STD testing. Pt instructed on and performed self swab. Advised that we would notify her of results once they are received.

## 2023-01-15 LAB — CERVICOVAGINAL ANCILLARY ONLY
Bacterial Vaginitis (gardnerella): NEGATIVE
Candida Glabrata: NEGATIVE
Candida Vaginitis: NEGATIVE
Chlamydia: NEGATIVE
Comment: NEGATIVE
Comment: NEGATIVE
Comment: NEGATIVE
Comment: NEGATIVE
Comment: NEGATIVE
Comment: NORMAL
Neisseria Gonorrhea: NEGATIVE
Trichomonas: NEGATIVE

## 2023-01-17 LAB — RPR+HBSAG+HIV
HIV Screen 4th Generation wRfx: REACTIVE
Hepatitis B Surface Ag: NEGATIVE
RPR Ser Ql: NONREACTIVE

## 2023-01-17 LAB — HIV-1/HIV-2 QUALITATIVE RNA
Final Interpretation: NEGATIVE
HIV-1 RNA, Qualitative: NONREACTIVE
HIV-2 RNA, Qualitative: NONREACTIVE

## 2023-01-17 LAB — HIV 1/2 AB DIFFERENTIATION
HIV 1 Ab: NONREACTIVE
HIV 2 Ab: NONREACTIVE
NOTE (HIV CONF MULTIP: NEGATIVE

## 2023-01-17 LAB — HEPATITIS C ANTIBODY: Hep C Virus Ab: NONREACTIVE

## 2023-01-18 ENCOUNTER — Other Ambulatory Visit (HOSPITAL_BASED_OUTPATIENT_CLINIC_OR_DEPARTMENT_OTHER): Payer: Self-pay | Admitting: *Deleted

## 2023-01-18 DIAGNOSIS — Z114 Encounter for screening for human immunodeficiency virus [HIV]: Secondary | ICD-10-CM

## 2023-01-18 DIAGNOSIS — Z789 Other specified health status: Secondary | ICD-10-CM

## 2023-01-18 NOTE — Progress Notes (Signed)
Order entry for HIV screen recheck see result note from 01/12/23 HIV test result KW CMA

## 2023-01-22 HISTORY — PX: HAND SURGERY: SHX662

## 2023-01-26 ENCOUNTER — Other Ambulatory Visit: Payer: Self-pay

## 2023-01-26 ENCOUNTER — Emergency Department (HOSPITAL_COMMUNITY): Payer: Medicaid Other

## 2023-01-26 ENCOUNTER — Encounter (HOSPITAL_COMMUNITY): Payer: Self-pay

## 2023-01-26 ENCOUNTER — Emergency Department (HOSPITAL_COMMUNITY)
Admission: EM | Admit: 2023-01-26 | Discharge: 2023-01-27 | Disposition: A | Discharge status: Home / Self Care | Payer: Medicaid Other | Attending: Emergency Medicine | Admitting: Emergency Medicine

## 2023-01-26 DIAGNOSIS — S62351A Nondisplaced fracture of shaft of second metacarpal bone, left hand, initial encounter for closed fracture: Secondary | ICD-10-CM | POA: Insufficient documentation

## 2023-01-26 DIAGNOSIS — R609 Edema, unspecified: Secondary | ICD-10-CM | POA: Diagnosis not present

## 2023-01-26 DIAGNOSIS — S0093XA Contusion of unspecified part of head, initial encounter: Secondary | ICD-10-CM | POA: Insufficient documentation

## 2023-01-26 DIAGNOSIS — S0990XA Unspecified injury of head, initial encounter: Secondary | ICD-10-CM | POA: Diagnosis not present

## 2023-01-26 DIAGNOSIS — S62343A Nondisplaced fracture of base of third metacarpal bone, left hand, initial encounter for closed fracture: Secondary | ICD-10-CM | POA: Insufficient documentation

## 2023-01-26 DIAGNOSIS — Y92410 Unspecified street and highway as the place of occurrence of the external cause: Secondary | ICD-10-CM | POA: Diagnosis not present

## 2023-01-26 DIAGNOSIS — J329 Chronic sinusitis, unspecified: Secondary | ICD-10-CM | POA: Diagnosis not present

## 2023-01-26 DIAGNOSIS — S0081XA Abrasion of other part of head, initial encounter: Secondary | ICD-10-CM | POA: Diagnosis not present

## 2023-01-26 DIAGNOSIS — M79672 Pain in left foot: Secondary | ICD-10-CM | POA: Insufficient documentation

## 2023-01-26 DIAGNOSIS — K5090 Crohn's disease, unspecified, without complications: Secondary | ICD-10-CM | POA: Insufficient documentation

## 2023-01-26 DIAGNOSIS — F419 Anxiety disorder, unspecified: Secondary | ICD-10-CM | POA: Insufficient documentation

## 2023-01-26 DIAGNOSIS — S62311A Displaced fracture of base of second metacarpal bone. left hand, initial encounter for closed fracture: Secondary | ICD-10-CM | POA: Diagnosis not present

## 2023-01-26 DIAGNOSIS — S199XXA Unspecified injury of neck, initial encounter: Secondary | ICD-10-CM | POA: Diagnosis not present

## 2023-01-26 DIAGNOSIS — R780 Finding of alcohol in blood: Secondary | ICD-10-CM | POA: Diagnosis not present

## 2023-01-26 DIAGNOSIS — S00211A Abrasion of right eyelid and periocular area, initial encounter: Secondary | ICD-10-CM | POA: Insufficient documentation

## 2023-01-26 DIAGNOSIS — S6992XA Unspecified injury of left wrist, hand and finger(s), initial encounter: Secondary | ICD-10-CM | POA: Diagnosis present

## 2023-01-26 DIAGNOSIS — Y906 Blood alcohol level of 120-199 mg/100 ml: Secondary | ICD-10-CM | POA: Insufficient documentation

## 2023-01-26 DIAGNOSIS — T07XXXA Unspecified multiple injuries, initial encounter: Secondary | ICD-10-CM

## 2023-01-26 DIAGNOSIS — M79643 Pain in unspecified hand: Secondary | ICD-10-CM | POA: Diagnosis not present

## 2023-01-26 DIAGNOSIS — G44309 Post-traumatic headache, unspecified, not intractable: Secondary | ICD-10-CM | POA: Diagnosis not present

## 2023-01-26 DIAGNOSIS — S62313A Displaced fracture of base of third metacarpal bone, left hand, initial encounter for closed fracture: Secondary | ICD-10-CM | POA: Diagnosis not present

## 2023-01-26 LAB — CBC WITH DIFFERENTIAL/PLATELET
Abs Immature Granulocytes: 0.03 10*3/uL (ref 0.00–0.07)
Basophils Absolute: 0.1 10*3/uL (ref 0.0–0.1)
Basophils Relative: 1 %
Eosinophils Absolute: 0.2 10*3/uL (ref 0.0–0.5)
Eosinophils Relative: 2 %
HCT: 48 % — ABNORMAL HIGH (ref 36.0–46.0)
Hemoglobin: 16 g/dL — ABNORMAL HIGH (ref 12.0–15.0)
Immature Granulocytes: 0 %
Lymphocytes Relative: 27 %
Lymphs Abs: 3 10*3/uL (ref 0.7–4.0)
MCH: 34 pg (ref 26.0–34.0)
MCHC: 33.3 g/dL (ref 30.0–36.0)
MCV: 102.1 fL — ABNORMAL HIGH (ref 80.0–100.0)
Monocytes Absolute: 0.5 10*3/uL (ref 0.1–1.0)
Monocytes Relative: 4 %
Neutro Abs: 7.4 10*3/uL (ref 1.7–7.7)
Neutrophils Relative %: 66 %
Platelets: 332 10*3/uL (ref 150–400)
RBC: 4.7 MIL/uL (ref 3.87–5.11)
RDW: 11.9 % (ref 11.5–15.5)
WBC: 11.2 10*3/uL — ABNORMAL HIGH (ref 4.0–10.5)
nRBC: 0 % (ref 0.0–0.2)

## 2023-01-26 LAB — ETHANOL: Alcohol, Ethyl (B): 176 mg/dL — ABNORMAL HIGH (ref <10)

## 2023-01-26 LAB — HCG, QUANTITATIVE, PREGNANCY: hCG, Beta Chain, Quant, S: <1 m[IU]/mL (ref <5)
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
## 2023-01-26 NOTE — ED Notes (Signed)
Ortho was contacted for a radial gutter splint

## 2023-01-27 MED ORDER — HYDROCODONE-ACETAMINOPHEN 5-325 MG PO TABS
1.0000 | ORAL_TABLET | Freq: Once | ORAL | Status: AC
  Administered 2023-01-27: 1 via ORAL
  Filled 2023-01-27: qty 1

## 2023-01-27 MED ORDER — HYDROCODONE-ACETAMINOPHEN 5-325 MG PO TABS: 1.0000 | ORAL_TABLET | ORAL | 0 refills | Status: DC | PRN

## 2023-01-27 MED ORDER — KETOROLAC TROMETHAMINE 30 MG/ML IJ SOLN
30.0000 mg | Freq: Once | INTRAMUSCULAR | Status: DC
  Filled 2023-01-27: qty 1

## 2023-01-27 NOTE — Progress Notes (Signed)
Orthopedic Tech Progress Note Patient Details:  Lacey Perez [birth year] [id number]  Ortho Devices Type of Ortho Device: Rad Gutter splint Ortho Device/Splint Location: LUE Ortho Device/Splint Interventions: Ordered, Application, Adjustment   Post Interventions Patient Tolerated: Fair  Lacey Perez 01/27/2023, 1:52 AM

## 2023-01-27 NOTE — ED Provider Notes (Signed)
.  Splint Application  Date/Time: 01/27/2023 1:06 AM  Performed by: Jeannie Fend, PA-C Authorized by: Jeannie Fend, PA-C   Consent:    Consent obtained:  Verbal   Consent given by:  Patient   Risks discussed:  Discoloration, numbness, swelling and pain   Alternatives discussed:  No treatment Universal protocol:    Patient identity confirmed:  Verbally with patient Pre-procedure details:    Distal neurologic exam:  Normal Procedure details:    Location:  Arm   Arm location:  L lower arm   Upper extremity cast type: radial gutter.   Splint type:  Radial gutter   Supplies:  Cotton padding, elastic bandage and prefabricated splint Post-procedure details:    Distal neurologic exam:  Normal   Distal perfusion: brisk capillary refill     Procedure completion:  Tolerated     Jeannie Fend, PA-C 01/27/23 0107    Palumbo, April, MD 01/27/23 0454

## 2023-01-30 DIAGNOSIS — S62311A Displaced fracture of base of second metacarpal bone. left hand, initial encounter for closed fracture: Secondary | ICD-10-CM | POA: Diagnosis not present

## 2023-01-30 DIAGNOSIS — S62323A Displaced fracture of shaft of third metacarpal bone, left hand, initial encounter for closed fracture: Secondary | ICD-10-CM | POA: Diagnosis not present

## 2023-02-01 DIAGNOSIS — G8918 Other acute postprocedural pain: Secondary | ICD-10-CM | POA: Diagnosis not present

## 2023-02-01 DIAGNOSIS — S62323A Displaced fracture of shaft of third metacarpal bone, left hand, initial encounter for closed fracture: Secondary | ICD-10-CM | POA: Diagnosis not present

## 2023-02-01 DIAGNOSIS — S62311A Displaced fracture of base of second metacarpal bone. left hand, initial encounter for closed fracture: Secondary | ICD-10-CM | POA: Diagnosis not present

## 2023-02-08 DIAGNOSIS — S62323A Displaced fracture of shaft of third metacarpal bone, left hand, initial encounter for closed fracture: Secondary | ICD-10-CM | POA: Diagnosis not present

## 2023-02-08 DIAGNOSIS — M79642 Pain in left hand: Secondary | ICD-10-CM | POA: Diagnosis not present

## 2023-02-08 DIAGNOSIS — M25642 Stiffness of left hand, not elsewhere classified: Secondary | ICD-10-CM | POA: Diagnosis not present

## 2023-02-08 DIAGNOSIS — S62311A Displaced fracture of base of second metacarpal bone. left hand, initial encounter for closed fracture: Secondary | ICD-10-CM | POA: Diagnosis not present

## 2023-02-09 ENCOUNTER — Other Ambulatory Visit (HOSPITAL_BASED_OUTPATIENT_CLINIC_OR_DEPARTMENT_OTHER): Payer: Medicaid Other

## 2023-02-09 DIAGNOSIS — Z114 Encounter for screening for human immunodeficiency virus [HIV]: Secondary | ICD-10-CM | POA: Diagnosis not present

## 2023-02-09 DIAGNOSIS — Z789 Other specified health status: Secondary | ICD-10-CM | POA: Diagnosis not present

## 2023-02-14 ENCOUNTER — Encounter (HOSPITAL_BASED_OUTPATIENT_CLINIC_OR_DEPARTMENT_OTHER): Payer: Self-pay

## 2023-02-14 DIAGNOSIS — S62311A Displaced fracture of base of second metacarpal bone. left hand, initial encounter for closed fracture: Secondary | ICD-10-CM | POA: Diagnosis not present

## 2023-02-14 DIAGNOSIS — S62323A Displaced fracture of shaft of third metacarpal bone, left hand, initial encounter for closed fracture: Secondary | ICD-10-CM | POA: Diagnosis not present

## 2023-02-14 LAB — HIV ANTIBODY (ROUTINE TESTING W REFLEX): HIV Screen 4th Generation wRfx: REACTIVE

## 2023-02-14 LAB — HIV-1/HIV-2 QUALITATIVE RNA
Final Interpretation: NEGATIVE
HIV-1 RNA, Qualitative: NONREACTIVE
HIV-2 RNA, Qualitative: NONREACTIVE

## 2023-02-14 LAB — HIV 1/2 AB DIFFERENTIATION
HIV 1 Ab: NONREACTIVE
HIV 2 Ab: NONREACTIVE
NOTE (HIV CONF MULTIP: NEGATIVE

## 2023-02-22 DIAGNOSIS — S62311A Displaced fracture of base of second metacarpal bone. left hand, initial encounter for closed fracture: Secondary | ICD-10-CM | POA: Diagnosis not present

## 2023-02-22 DIAGNOSIS — M25642 Stiffness of left hand, not elsewhere classified: Secondary | ICD-10-CM | POA: Diagnosis not present

## 2023-02-22 DIAGNOSIS — M79642 Pain in left hand: Secondary | ICD-10-CM | POA: Diagnosis not present

## 2023-02-22 DIAGNOSIS — S62323A Displaced fracture of shaft of third metacarpal bone, left hand, initial encounter for closed fracture: Secondary | ICD-10-CM | POA: Diagnosis not present

## 2023-02-26 DIAGNOSIS — S62323A Displaced fracture of shaft of third metacarpal bone, left hand, initial encounter for closed fracture: Secondary | ICD-10-CM | POA: Diagnosis not present

## 2023-02-26 DIAGNOSIS — S62311A Displaced fracture of base of second metacarpal bone. left hand, initial encounter for closed fracture: Secondary | ICD-10-CM | POA: Diagnosis not present

## 2023-03-01 DIAGNOSIS — S62311A Displaced fracture of base of second metacarpal bone. left hand, initial encounter for closed fracture: Secondary | ICD-10-CM | POA: Diagnosis not present

## 2023-03-01 DIAGNOSIS — M79642 Pain in left hand: Secondary | ICD-10-CM | POA: Diagnosis not present

## 2023-03-01 DIAGNOSIS — M25642 Stiffness of left hand, not elsewhere classified: Secondary | ICD-10-CM | POA: Diagnosis not present

## 2023-03-01 DIAGNOSIS — S62323A Displaced fracture of shaft of third metacarpal bone, left hand, initial encounter for closed fracture: Secondary | ICD-10-CM | POA: Diagnosis not present

## 2023-03-08 DIAGNOSIS — M25642 Stiffness of left hand, not elsewhere classified: Secondary | ICD-10-CM | POA: Diagnosis not present

## 2023-03-08 DIAGNOSIS — S62311A Displaced fracture of base of second metacarpal bone. left hand, initial encounter for closed fracture: Secondary | ICD-10-CM | POA: Diagnosis not present

## 2023-03-08 DIAGNOSIS — M79642 Pain in left hand: Secondary | ICD-10-CM | POA: Diagnosis not present

## 2023-03-08 DIAGNOSIS — S62323A Displaced fracture of shaft of third metacarpal bone, left hand, initial encounter for closed fracture: Secondary | ICD-10-CM | POA: Diagnosis not present

## 2023-03-13 DIAGNOSIS — S62311A Displaced fracture of base of second metacarpal bone. left hand, initial encounter for closed fracture: Secondary | ICD-10-CM | POA: Diagnosis not present

## 2023-03-13 DIAGNOSIS — S62323A Displaced fracture of shaft of third metacarpal bone, left hand, initial encounter for closed fracture: Secondary | ICD-10-CM | POA: Diagnosis not present

## 2023-03-23 DIAGNOSIS — S62323A Displaced fracture of shaft of third metacarpal bone, left hand, initial encounter for closed fracture: Secondary | ICD-10-CM | POA: Diagnosis not present

## 2023-03-23 DIAGNOSIS — M25642 Stiffness of left hand, not elsewhere classified: Secondary | ICD-10-CM | POA: Diagnosis not present

## 2023-03-23 DIAGNOSIS — S62311A Displaced fracture of base of second metacarpal bone. left hand, initial encounter for closed fracture: Secondary | ICD-10-CM | POA: Diagnosis not present

## 2023-03-23 DIAGNOSIS — M79642 Pain in left hand: Secondary | ICD-10-CM | POA: Diagnosis not present

## 2023-04-03 ENCOUNTER — Telehealth: Payer: Medicaid Other | Admitting: Physician Assistant

## 2023-04-03 DIAGNOSIS — J069 Acute upper respiratory infection, unspecified: Secondary | ICD-10-CM

## 2023-04-03 MED ORDER — BENZONATATE 100 MG PO CAPS
100.0000 mg | ORAL_CAPSULE | Freq: Three times a day (TID) | ORAL | 0 refills | Status: DC | PRN
Start: 2023-04-03 — End: 2023-05-11

## 2023-04-03 MED ORDER — PROMETHAZINE-DM 6.25-15 MG/5ML PO SYRP: 5.0000 mL | ORAL_SOLUTION | Freq: Four times a day (QID) | ORAL | 0 refills | Status: DC | PRN

## 2023-04-03 NOTE — Addendum Note (Signed)
Addended by: Waldon Merl on: 04/03/2023 02:46 PM   Modules accepted: Orders

## 2023-04-03 NOTE — Progress Notes (Signed)
I have spent 5 minutes in review of e-visit questionnaire, review and updating patient chart, medical decision making and response to patient.   William Cody Martin, PA-C    

## 2023-04-03 NOTE — Progress Notes (Signed)

## 2023-04-05 DIAGNOSIS — S62323A Displaced fracture of shaft of third metacarpal bone, left hand, initial encounter for closed fracture: Secondary | ICD-10-CM | POA: Diagnosis not present

## 2023-04-05 DIAGNOSIS — M79642 Pain in left hand: Secondary | ICD-10-CM | POA: Diagnosis not present

## 2023-04-05 DIAGNOSIS — S62311A Displaced fracture of base of second metacarpal bone. left hand, initial encounter for closed fracture: Secondary | ICD-10-CM | POA: Diagnosis not present

## 2023-04-05 DIAGNOSIS — M25642 Stiffness of left hand, not elsewhere classified: Secondary | ICD-10-CM | POA: Diagnosis not present

## 2023-04-10 DIAGNOSIS — S62311A Displaced fracture of base of second metacarpal bone. left hand, initial encounter for closed fracture: Secondary | ICD-10-CM | POA: Diagnosis not present

## 2023-04-10 DIAGNOSIS — S62323A Displaced fracture of shaft of third metacarpal bone, left hand, initial encounter for closed fracture: Secondary | ICD-10-CM | POA: Diagnosis not present

## 2023-04-10 DIAGNOSIS — M25642 Stiffness of left hand, not elsewhere classified: Secondary | ICD-10-CM | POA: Diagnosis not present

## 2023-04-10 DIAGNOSIS — M79642 Pain in left hand: Secondary | ICD-10-CM | POA: Diagnosis not present

## 2023-04-24 DIAGNOSIS — M25642 Stiffness of left hand, not elsewhere classified: Secondary | ICD-10-CM | POA: Diagnosis not present

## 2023-04-24 DIAGNOSIS — S62311A Displaced fracture of base of second metacarpal bone. left hand, initial encounter for closed fracture: Secondary | ICD-10-CM | POA: Diagnosis not present

## 2023-04-24 DIAGNOSIS — M79642 Pain in left hand: Secondary | ICD-10-CM | POA: Diagnosis not present

## 2023-04-24 DIAGNOSIS — S62323A Displaced fracture of shaft of third metacarpal bone, left hand, initial encounter for closed fracture: Secondary | ICD-10-CM | POA: Diagnosis not present

## 2023-05-08 ENCOUNTER — Ambulatory Visit (HOSPITAL_BASED_OUTPATIENT_CLINIC_OR_DEPARTMENT_OTHER): Payer: Medicaid Other | Admitting: Certified Nurse Midwife

## 2023-05-08 ENCOUNTER — Encounter (HOSPITAL_BASED_OUTPATIENT_CLINIC_OR_DEPARTMENT_OTHER): Payer: Self-pay | Admitting: Certified Nurse Midwife

## 2023-05-08 ENCOUNTER — Other Ambulatory Visit (HOSPITAL_COMMUNITY)
Admission: RE | Admit: 2023-05-08 | Discharge: 2023-05-08 | Disposition: A | Discharge status: Home / Self Care | Payer: Medicaid Other | Source: Ambulatory Visit | Attending: Certified Nurse Midwife | Admitting: Certified Nurse Midwife

## 2023-05-08 VITALS — BP 116/85 | HR 88 | Ht 66.0 in | Wt 145.6 lb

## 2023-05-08 DIAGNOSIS — Z01419 Encounter for gynecological examination (general) (routine) without abnormal findings: Secondary | ICD-10-CM

## 2023-05-08 DIAGNOSIS — Z113 Encounter for screening for infections with a predominantly sexual mode of transmission: Secondary | ICD-10-CM | POA: Insufficient documentation

## 2023-05-08 DIAGNOSIS — K50919 Crohn's disease, unspecified, with unspecified complications: Secondary | ICD-10-CM | POA: Diagnosis not present

## 2023-05-08 DIAGNOSIS — E282 Polycystic ovarian syndrome: Secondary | ICD-10-CM | POA: Insufficient documentation

## 2023-05-08 MED ORDER — PRENATAL PLUS 27-1 MG PO TABS: 1.0000 | ORAL_TABLET | Freq: Every day | ORAL | 5 refills | Status: DC

## 2023-05-08 NOTE — Addendum Note (Signed)
Addended byMerrilee Jansky on: 05/08/2023 12:03 PM   Modules accepted: Orders

## 2023-05-08 NOTE — Progress Notes (Signed)
22 y.o. No obstetric history on file. Single Black or [place] American female here for annual exam. 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 Experienced her 1st flare of Crohn's Disease while in the [employer]. On Humira (injections twice monthly). She has a good support system. Works at [employer] and in [employer]. Lives with her supportive boyfriend. Pt reports periods every 28-47 days. She does not desire birth control at this time. Not actively trying to conceive but would be okay if she did conceive. Pt wondering if she may have PCOS. Reports some hormonal acne, denies hirsutism. States she also receives care at the [place] and they are planning a Pelvic US in future to evaluate for polycystic appearing ovaries.   Patient's last menstrual period was 04/14/2023 (exact date).          Sexually active: Yes.    The current method of family planning is none.     Upstream - 05/08/23 0908       Pregnancy Intention Screening   Does the patient want to become pregnant in the next year? No    Does the patient's partner want to become pregnant in the next year? No    Would the patient like to discuss contraceptive options today? No            The pregnancy intention screening data noted above was reviewed. Potential methods of contraception were discussed. The patient elected to proceed with No data recorded.  Exercising: Yes.       Smoker:  "Vapes" but "trying to quit"  Health Maintenance: Pap:  UTD 2023, next pap due 2026 History of abnormal Pap:  no    reports that she has never smoked. She has never used smokeless tobacco. She reports that she does not currently use alcohol. She reports that she does not use drugs.  Past Medical History:  Diagnosis Date   Asthma    UC (ulcerative colitis) (HCC)     Past Surgical History:  Procedure Laterality Date   TONSILLECTOMY AND ADENOIDECTOMY     WISDOM TOOTH EXTRACTION      Current Outpatient Medications  Medication Sig Dispense Refill   acetaminophen  (TYLENOL) 325 MG tablet Take 2 tablets (650 mg total) by mouth every 6 (six) hours as needed for moderate pain. 30 tablet 0   Adalimumab (HUMIRA PEN) 40 MG/0.4ML PNKT Inject 1 pen. into the skin every 14 (fourteen) days. 2 each 6   azathioprine (IMURAN) 100 MG tablet Take 1.5 tablets (150 mg total) by mouth daily. 45 tablet 3   cetirizine (ZYRTEC ALLERGY) 10 MG tablet Take 1 tablet (10 mg total) by mouth daily. 30 tablet 0   fluticasone (FLONASE) 50 MCG/ACT nasal spray Place 2 sprays into both nostrils daily. (Patient taking differently: Place 2 sprays into both nostrils daily as needed for allergies.) 16 g 0   HYDROcodone-acetaminophen (NORCO/VICODIN) 5-325 MG tablet Take 1 tablet by mouth every 4 (four) hours as needed. 10 tablet 0   mesalamine (LIALDA) 1.2 g EC tablet Take 4 tablets (4.8 g total) by mouth daily with breakfast. 120 tablet 11   prenatal vitamin w/FE, FA (PRENATAL 1 + 1) 27-1 MG TABS tablet Take 1 tablet by mouth daily at 12 noon. 100 tablet 5   Probiotic Product (PROBIOTIC PO) Take 1 capsule by mouth daily.     benzonatate (TESSALON) 100 MG capsule Take 1 capsule (100 mg total) by mouth 3 (three) times daily as needed for cough. (Patient not taking: Reported on  05/08/2023) 30 capsule 0   promethazine-dextromethorphan (PROMETHAZINE-DM) 6.25-15 MG/5ML syrup Take 5 mLs by mouth 4 (four) times daily as needed for cough. (Patient not taking: Reported on 05/08/2023) 118 mL 0   pseudoephedrine (SUDAFED) 60 MG tablet Take 1 tablet (60 mg total) by mouth every 8 (eight) hours as needed for congestion. (Patient not taking: Reported on 05/08/2023) 30 tablet 0   No current facility-administered medications for this visit.    Family History  Problem Relation Age of Onset   Breast cancer Paternal Grandmother    Diabetes Paternal Grandmother    Colon polyps Maternal Grandmother    Asthma Father    Diabetes Mother    Hypertension Mother    Asthma Mother    Fibroids Mother    Asthma Sister     Colon cancer Neg Hx    Esophageal cancer Neg Hx    Rectal cancer Neg Hx    Stomach cancer Neg Hx     ROS: Constitutional: negative Genitourinary:negative  Exam:   BP 116/85 (BP Location: Left Arm, Patient Position: Sitting, Cuff Size: Normal)   Pulse 88   Ht 5\' 6"  (1.676 m)   Wt 145 lb 9.6 oz (66 kg)   LMP 04/14/2023 (Exact Date)   BMI 23.50 kg/m   Height: 5\' 6"  (167.6 cm)  General appearance: alert, cooperative and appears stated age Head: Normocephalic, without obvious abnormality, atraumatic Neck: no adenopathy, supple, symmetrical, trachea midline Lungs: clear to auscultation bilaterally Breasts: normal appearance, no masses or tenderness, Inspection negative, No nipple retraction or dimpling, No nipple discharge or bleeding, No axillary or supraclavicular adenopathy, Normal to palpation without dominant masses Heart: regular rate and rhythm Abdomen: soft, non-tender; bowel sounds normal; no masses,  no organomegaly Extremities: extremities normal, atraumatic, no cyanosis or edema Skin: Skin color, texture, turgor normal. No rashes or lesions Lymph nodes: Cervical, supraclavicular, and axillary nodes normal. No abnormal inguinal nodes palpated Neurologic: Grossly normal   Pelvic: External genitalia:  no lesions              Urethra:  normal appearing urethra with no masses, tenderness or lesions              Bartholins and Skenes: normal                 Vagina: normal appearing vagina with normal color and no discharge, no lesions              Cervix: no cervical motion tenderness              Pap taken: No. Bimanual Exam:  Uterus:  normal size, contour, position, consistency, mobility, non-tender              Adnexa: no mass, fullness, tenderness               Rectovaginal: Confirms               Anus:  normal sphincter tone, no lesions  Chaperone, Hendricks Milo, CMA, was present for exam.  Assessment/Plan:  1. Routine screening for STI (sexually transmitted  infection) -Pt requested routine STI screening -Declined screening for HSV1/2 - Hepatitis B Surface AntiGEN - Hepatitis C Antibody - HIV antibody (with reflex) - RPR  2. PCOS (polycystic ovarian syndrome) -Will be evaluated further at [facility] ([place] [facility]) - Testosterone level today  3. Encounter for annual routine gynecological examination  Pap UTD and not due at this time Pt desired routine STI screening Encouraged her to quit  vaping Start PNV daily, Rx sent  4. Crohn's disease with complication, unspecified gastrointestinal tract location (HCC) -Stable at present on Humira.  RTO one year for annual gyn exam and prn if issues arise. Letta Kocher

## 2023-05-09 ENCOUNTER — Other Ambulatory Visit (HOSPITAL_BASED_OUTPATIENT_CLINIC_OR_DEPARTMENT_OTHER): Payer: Self-pay | Admitting: Certified Nurse Midwife

## 2023-05-09 ENCOUNTER — Encounter (HOSPITAL_BASED_OUTPATIENT_CLINIC_OR_DEPARTMENT_OTHER): Payer: Self-pay | Admitting: Certified Nurse Midwife

## 2023-05-09 DIAGNOSIS — M25642 Stiffness of left hand, not elsewhere classified: Secondary | ICD-10-CM | POA: Diagnosis not present

## 2023-05-09 DIAGNOSIS — Z21 Asymptomatic human immunodeficiency virus [HIV] infection status: Secondary | ICD-10-CM

## 2023-05-09 DIAGNOSIS — S62311A Displaced fracture of base of second metacarpal bone. left hand, initial encounter for closed fracture: Secondary | ICD-10-CM | POA: Diagnosis not present

## 2023-05-09 DIAGNOSIS — M79642 Pain in left hand: Secondary | ICD-10-CM | POA: Diagnosis not present

## 2023-05-09 DIAGNOSIS — S62323A Displaced fracture of shaft of third metacarpal bone, left hand, initial encounter for closed fracture: Secondary | ICD-10-CM | POA: Diagnosis not present

## 2023-05-09 LAB — CERVICOVAGINAL ANCILLARY ONLY
Bacterial Vaginitis (gardnerella): NEGATIVE
Candida Glabrata: NEGATIVE
Candida Vaginitis: NEGATIVE
Chlamydia: NEGATIVE
Comment: NEGATIVE
Comment: NEGATIVE
Comment: NEGATIVE
Comment: NEGATIVE
Comment: NEGATIVE
Comment: NORMAL
Neisseria Gonorrhea: NEGATIVE
Trichomonas: NEGATIVE

## 2023-05-09 NOTE — Progress Notes (Signed)
05/09/23 4:46pm. Patient called.  Spoke with patient after confirming name and date of birth. 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 Pt aware that result could reflect a False Positive. Pt in agreement to follow-up with ID. Referral placed. She will call me with questions.  Letta Kocher

## 2023-05-10 ENCOUNTER — Telehealth (HOSPITAL_BASED_OUTPATIENT_CLINIC_OR_DEPARTMENT_OTHER): Payer: Self-pay | Admitting: Certified Nurse Midwife

## 2023-05-10 ENCOUNTER — Encounter (HOSPITAL_BASED_OUTPATIENT_CLINIC_OR_DEPARTMENT_OTHER): Payer: Self-pay | Admitting: Certified Nurse Midwife

## 2023-05-10 NOTE — Telephone Encounter (Signed)
CNM reached back out to patient via telephone at 7:55am and discussed results with patient. She is agreeable to referral to ID. Referral was placed yesterday and pt aware.  She understands that further confirmation needed and lab could reflect a false positive. Letta Kocher

## 2023-05-11 ENCOUNTER — Encounter: Payer: Self-pay | Admitting: Internal Medicine

## 2023-05-11 ENCOUNTER — Other Ambulatory Visit: Payer: Self-pay

## 2023-05-11 ENCOUNTER — Ambulatory Visit (INDEPENDENT_AMBULATORY_CARE_PROVIDER_SITE_OTHER): Payer: Medicaid Other | Admitting: Internal Medicine

## 2023-05-11 VITALS — BP 133/81 | HR 87 | Temp 98.3°F | Ht 66.0 in | Wt 145.0 lb

## 2023-05-11 DIAGNOSIS — Z113 Encounter for screening for infections with a predominantly sexual mode of transmission: Secondary | ICD-10-CM | POA: Diagnosis not present

## 2023-05-11 NOTE — Progress Notes (Signed)
[facility]      Reason for Consult: positive HIV Ab    Referring Physician: Merrilee Jansky CNM    Patient ID: Lacey Perez, female    DOB: 10-19-2000, 22 y.o.   MRN: [id number]  HPI:   Lacey Perez is here for evaluation of a positive confirmatory test for HIV.   She has been tested several times for HIV and initially had a screen positive and confirmatory test negative x 2 then last month tested positive on confirmatory testing.  Denies any risk, stable partner who is believed to negative.  No recent symptoms of weight loss, rash, diarrhea.    Past Medical History:  Diagnosis Date   Asthma    UC (ulcerative colitis) (HCC)     Prior to Admission medications   Medication Sig Start Date End Date Taking? Authorizing Provider  acetaminophen (TYLENOL) 325 MG tablet Take 2 tablets (650 mg total) by mouth every 6 (six) hours as needed for moderate pain. 10/26/22  Yes Wallis Bamberg, PA-C  cetirizine (ZYRTEC ALLERGY) 10 MG tablet Take 1 tablet (10 mg total) by mouth daily. 10/26/22  Yes Wallis Bamberg, PA-C  fluticasone (FLONASE) 50 MCG/ACT nasal spray Place 2 sprays into both nostrils daily. Patient taking differently: Place 2 sprays into both nostrils daily as needed for allergies. 12/15/20  Yes [name], MD  mesalamine (LIALDA) 1.2 g EC tablet Take 4 tablets (4.8 g total) by mouth daily with breakfast. 06/08/22  Yes [name], PA  prenatal vitamin w/FE, FA (PRENATAL 1 + 1) 27-1 MG TABS tablet Take 1 tablet by mouth daily at 12 noon. 05/08/23  Yes Lo, Toma Aran, CNM  Probiotic Product (PROBIOTIC PO) Take 1 capsule by mouth daily.   Yes [provider]  Adalimumab (HUMIRA PEN) 40 MG/0.4ML PNKT Inject 1 pen. into the skin every 14 (fourteen) days. Patient not taking: Reported on 05/11/2023 11/30/21   [name], MD  azathioprine (IMURAN) 100 MG tablet Take 1.5 tablets (150 mg total) by mouth daily. Patient not taking: Reported on 05/11/2023  02/07/22   [name], MD  benzonatate (TESSALON) 100 MG capsule Take 1 capsule (100 mg total) by mouth 3 (three) times daily as needed for cough. Patient not taking: Reported on 05/08/2023 04/03/23   Waldon Merl, PA-C  HYDROcodone-acetaminophen (NORCO/VICODIN) 5-325 MG tablet Take 1 tablet by mouth every 4 (four) hours as needed. Patient not taking: Reported on 05/11/2023 01/27/23   Jeannie Fend, PA-C  promethazine-dextromethorphan (PROMETHAZINE-DM) 6.25-15 MG/5ML syrup Take 5 mLs by mouth 4 (four) times daily as needed for cough. Patient not taking: Reported on 05/08/2023 04/03/23   Waldon Merl, PA-C  pseudoephedrine (SUDAFED) 60 MG tablet Take 1 tablet (60 mg total) by mouth every 8 (eight) hours as needed for congestion. Patient not taking: Reported on 05/08/2023 10/26/22   Wallis Bamberg, PA-C  albuterol (PROVENTIL HFA;VENTOLIN HFA) 108 (90 BASE) MCG/ACT inhaler Inhale 2 puffs into the lungs every 6 (six) hours as needed. For breathing   10/11/19  [provider]  Fluticasone-Salmeterol (ADVAIR) 250-50 MCG/DOSE AEPB Inhale 1 puff into the lungs every 12 (twelve) hours.    10/11/19  [provider]  ipratropium (ATROVENT) 0.03 % nasal spray Place 2 sprays into both nostrils 2 (two) times daily as needed for rhinitis. 05/28/20 06/13/20  Bing Neighbors, NP  montelukast (SINGULAIR) 10 MG tablet Take 10 mg by mouth at bedtime.    10/11/19  [provider]  No Known Allergies  Social History   Tobacco Use   Smoking status: Never   Smokeless tobacco: Never  Vaping Use   Vaping status: Every Day   Substances: Nicotine, Flavoring  Substance Use Topics   Alcohol use: Not Currently   Drug use: Never    Family History  Problem Relation Age of Onset   Breast cancer Paternal Grandmother    Diabetes Paternal Grandmother    Colon polyps Maternal Grandmother    Asthma Father    Diabetes Mother    Hypertension Mother    Asthma Mother    Fibroids  Mother    Asthma Sister    Colon cancer Neg Hx    Esophageal cancer Neg Hx    Rectal cancer Neg Hx    Stomach cancer Neg Hx      Review of Systems  Constitutional: negative for fatigue and malaise All other systems reviewed and are negative    Constitutional: in no apparent distress  Vitals:   05/11/23 0857  BP: 133/81  Pulse: 87  Temp: 98.3 F (36.8 C)  SpO2: 100%   EYES: anicteric Respiratory: normal respiratory effort   Labs: Lab Results  Component Value Date   WBC 11.2 (H) 01/26/2023   HGB 16.0 (H) 01/26/2023   HCT 48.0 (H) 01/26/2023   MCV 102.1 (H) 01/26/2023   PLT 332 01/26/2023    Lab Results  Component Value Date   CREATININE 0.70 04/13/2022   BUN 6 04/13/2022   NA 140 04/13/2022   K 4.0 04/13/2022   CL 104 04/13/2022   CO2 28 04/13/2022    Lab Results  Component Value Date   ALT 10 04/13/2022   AST 11 04/13/2022   ALKPHOS 38 (L) 04/13/2022   BILITOT 0.2 04/13/2022     Assessment: HIV positive confirmatory test in someone with a low risk.  Has had multiple screening tests positive suggesting some cross-reactivity to something.    Plan: 1)  check HIV RNA for confirmation If negative, suggest only testing with RNA in the future.  She will follow up if positive

## 2023-05-12 ENCOUNTER — Encounter: Payer: Self-pay | Admitting: Internal Medicine

## 2023-05-13 LAB — HIV-1 RNA QUANT-NO REFLEX-BLD
HIV 1 RNA Quant: NOT DETECTED {copies}/mL
HIV-1 RNA Quant, Log: NOT DETECTED {Log}

## 2023-05-13 LAB — HIV ANTIBODY (ROUTINE TESTING W REFLEX): HIV 1&2 Ab, 4th Generation: NONREACTIVE

## 2023-05-14 ENCOUNTER — Telehealth: Payer: Self-pay

## 2023-05-14 ENCOUNTER — Encounter (HOSPITAL_BASED_OUTPATIENT_CLINIC_OR_DEPARTMENT_OTHER): Payer: Self-pay | Admitting: Certified Nurse Midwife

## 2023-05-14 ENCOUNTER — Telehealth (HOSPITAL_BASED_OUTPATIENT_CLINIC_OR_DEPARTMENT_OTHER): Payer: Self-pay | Admitting: Certified Nurse Midwife

## 2023-05-14 NOTE — Telephone Encounter (Signed)
  Spoke with patient via telephone. She is aware that ID Physician confirmed that the HIV testing was False-Positive.  Lacey Perez

## 2023-05-14 NOTE — Telephone Encounter (Signed)
Patient called wanting to discuss her lab results.

## 2023-06-04 ENCOUNTER — Encounter (HOSPITAL_BASED_OUTPATIENT_CLINIC_OR_DEPARTMENT_OTHER): Payer: Self-pay | Admitting: Certified Nurse Midwife

## 2023-06-15 IMAGING — CT CT ENTEROGRAPHY (ABD-PELV W/ CM)
2 of 5 series · 16 of 46 positions shown, 18 images · IV contrast (APPLIED)
Comparison: 05/27/2021

CLINICAL DATA: Ulcerative colitis, abdominal pain, diarrhea, rectal
bleeding, colitis, nausea

EXAM:
CT ABDOMEN AND PELVIS WITH CONTRAST (ENTEROGRAPHY)
TECHNIQUE: Multidetector CT of the abdomen and pelvis during bolus
administration of intravenous contrast. Negative oral contrast was
given.

[Series 3: entero thins · axial · 0.87mm/px · z∈[+995,+1417]mm · 13 of 239 slices shown, 15 images]
[im 14/239  soft-tissue]
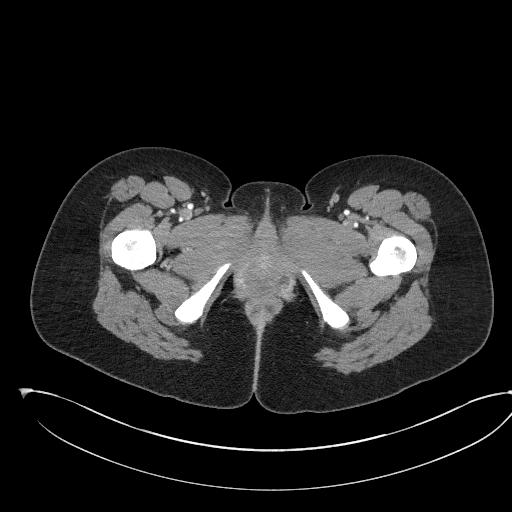
[im 14/239  bone]
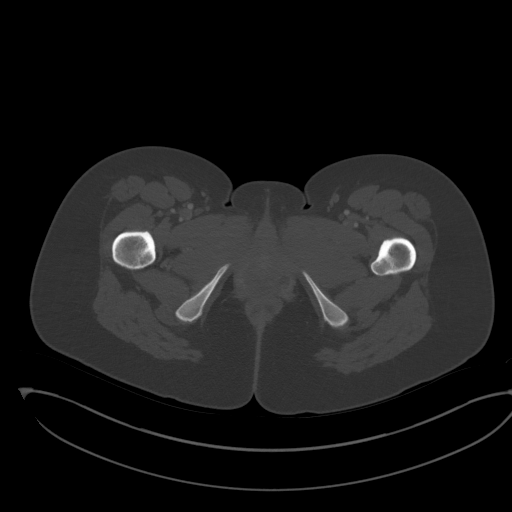
[im 27/239  soft-tissue]
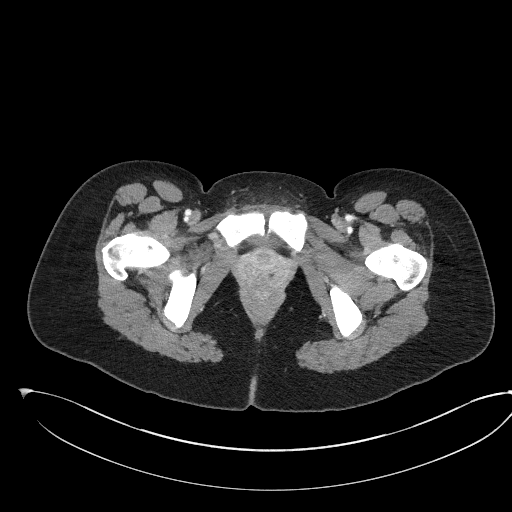
[im 53/239  soft-tissue]
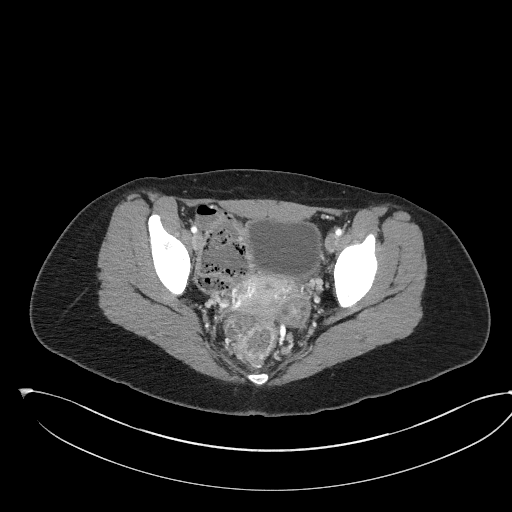
[im 67/239  soft-tissue]
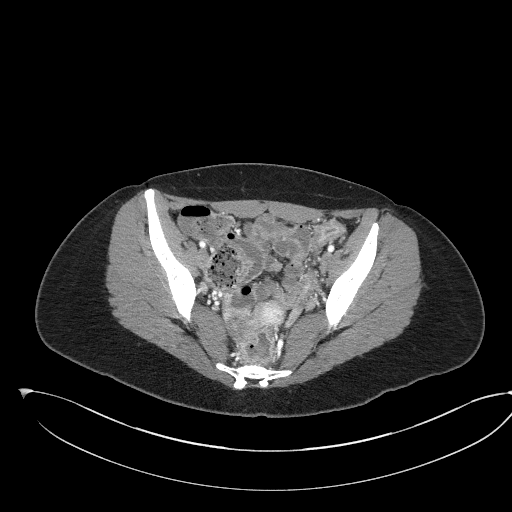
[im 80/239  soft-tissue]
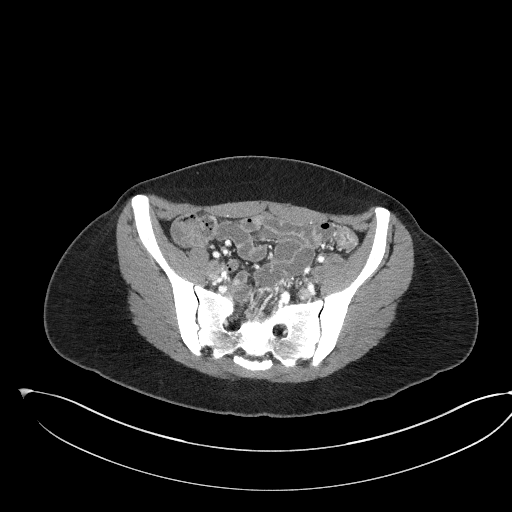
[im 106/239  soft-tissue]
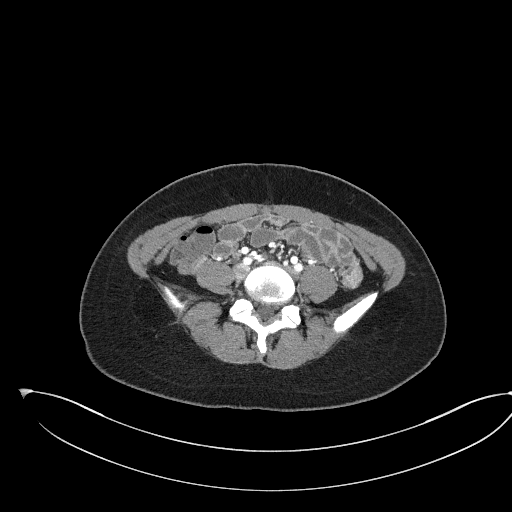
[im 120/239  soft-tissue]
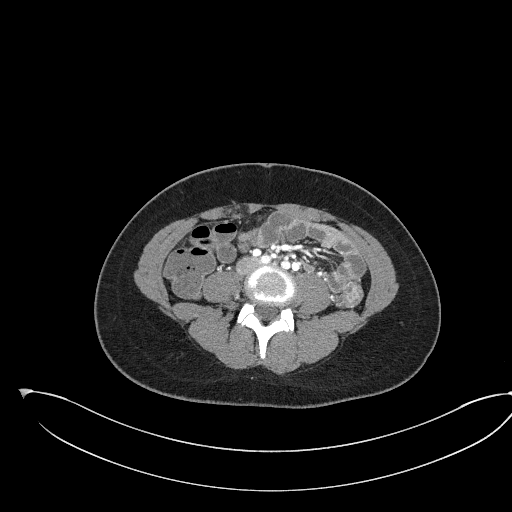
[im 133/239  soft-tissue]
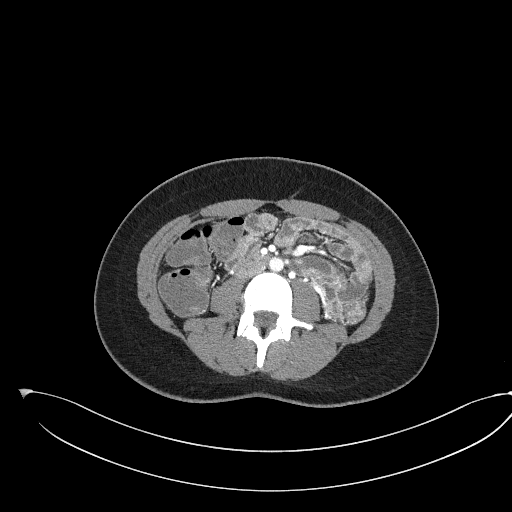
[im 159/239  soft-tissue]
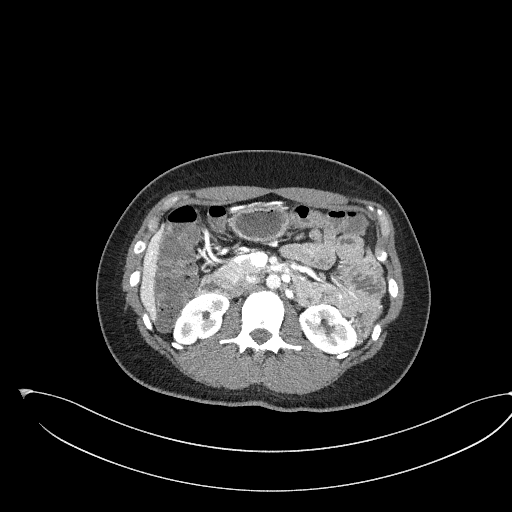
[im 159/239  bone]
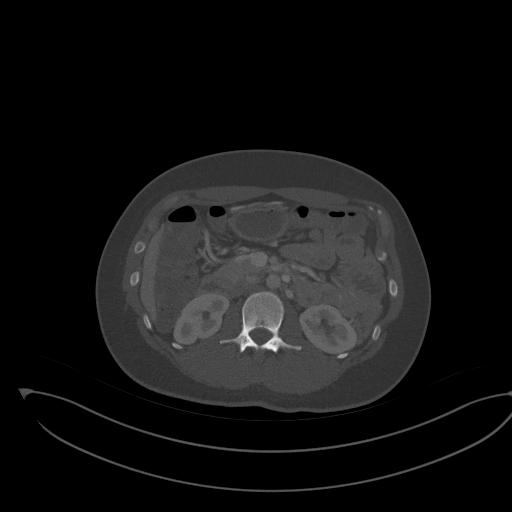
[im 172/239  soft-tissue]
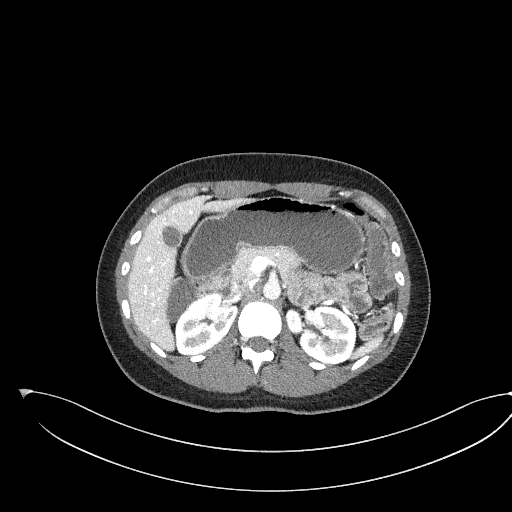
[im 186/239  soft-tissue]
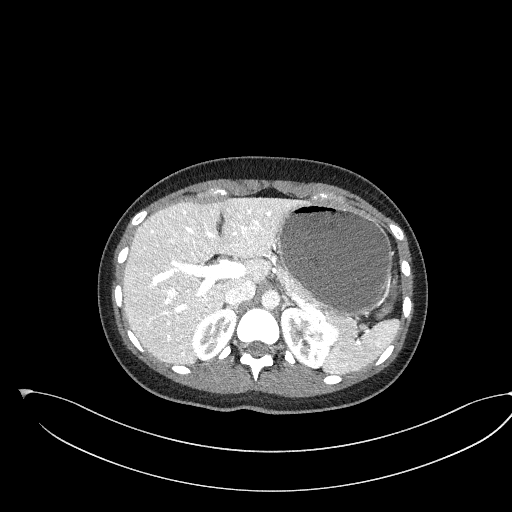
[im 212/239  soft-tissue]
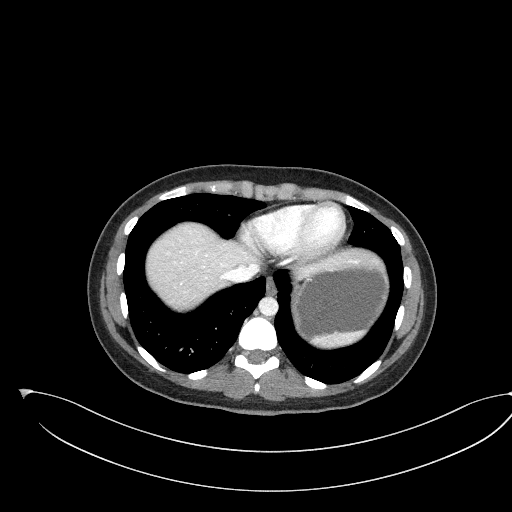
[im 225/239  soft-tissue]
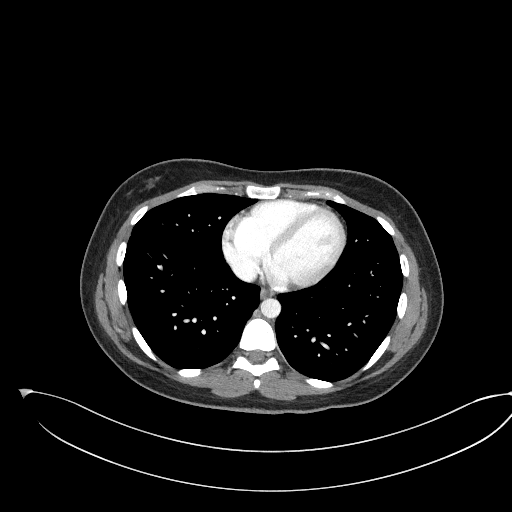

[Series 6: coronal · coronal · 0.80mm/px · 3 of 99 slices shown]
[im 33/99  soft-tissue]
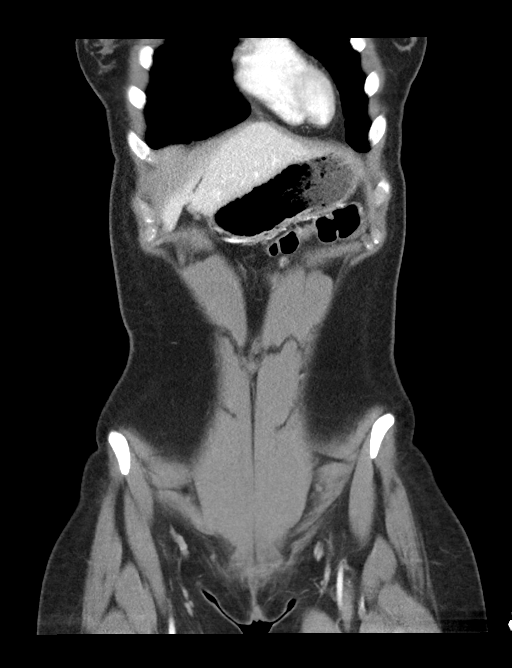
[im 44/99  soft-tissue]
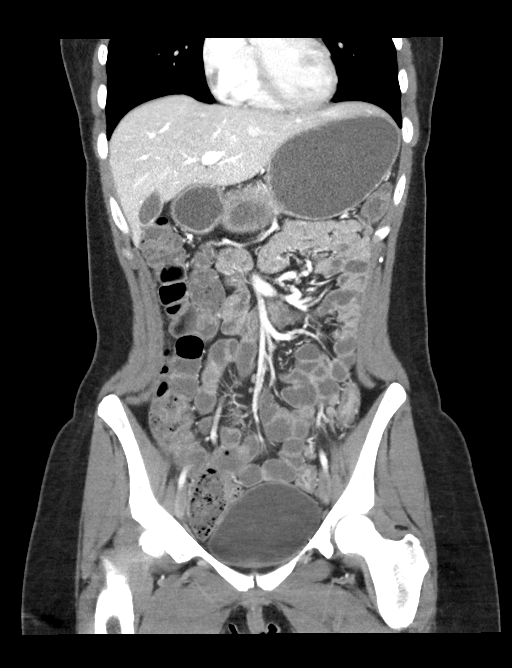
[im 55/99  soft-tissue]
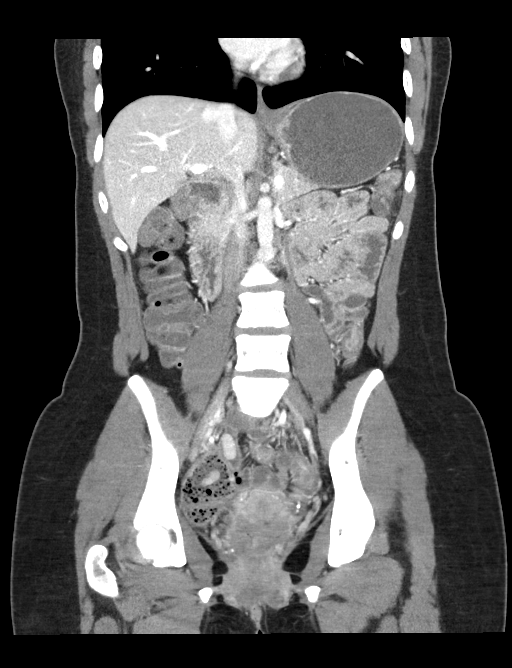

[16 of 46 positions shown; findings below may reference images not displayed]

RADIATION DOSE REDUCTION: This exam was performed according to the
departmental dose-optimization program which includes automated
exposure control, adjustment of the mA and/or kV according to
patient size and/or use of iterative reconstruction technique.

CONTRAST:  100mL OMNIPAQUE IOHEXOL 300 MG/ML  SOLN
FINDINGS: Lower chest: No acute abnormality.

Hepatobiliary: No solid liver abnormality is seen. No gallstones,
gallbladder wall thickening, or biliary dilatation.

Pancreas: Unremarkable. No pancreatic ductal dilatation or
surrounding inflammatory changes.

Spleen: Normal in size without significant abnormality.

Adrenals/Urinary Tract: Adrenal glands are unremarkable. Kidneys are
normal, without renal calculi, solid lesion, or hydronephrosis.
Bladder is unremarkable.

Stomach/Bowel: Stomach is within normal limits. Appendix appears
normal. Severe circumferential wall thickening and hyperenhancement
involving the descending colon through the rectum, new compared to
prior examination (series 2, image 44, 77). The sigmoid colon is
relatively featureless.

Vascular/Lymphatic: No significant vascular findings are present. No
enlarged abdominal or pelvic lymph nodes.

Reproductive: No mass or other significant abnormality. Small
bilateral ovarian follicles and crenulated corpus luteum of the
right ovary.

Other: No abdominal wall hernia or abnormality. No ascites.

Musculoskeletal: No acute or significant osseous findings.
IMPRESSION: Severe circumferential wall thickening and hyperenhancement
involving the descending colon through the rectum, new compared to
prior examination. The sigmoid colon is relatively featureless.
Findings are consistent with ulcerative colitis. No evidence of
complicating fistula, abscess, or stricture.

## 2023-07-25 DIAGNOSIS — E282 Polycystic ovarian syndrome: Secondary | ICD-10-CM

## 2023-07-25 HISTORY — DX: Polycystic ovarian syndrome: E28.2

## 2023-08-20 ENCOUNTER — Telehealth: Payer: Self-pay | Admitting: Physician Assistant

## 2023-08-20 NOTE — Telephone Encounter (Signed)
Patient called stated she is having a flare up seeking advise.

## 2023-08-20 NOTE — Telephone Encounter (Signed)
Per last office note - Please follow up in 6 month with Dr. Meridee Score. Continue Lialda, 4 tablets a day.  Lm on vm for patient to return call.

## 2023-08-21 NOTE — Telephone Encounter (Signed)
Called to follow up with patient regarding symptoms. Pt states that she stopped Lialda in 10/2022 and had been doing fine until last month. Colitis symptoms were mild last month but have gotten significantly worse this month. Pt reports liquid stool, a lot of blood, and typical abdominal pain. Pt reports [facility] fecal calprotectin and her level was 6,770. Pt states that she restarted Lialda 4 tablets today. Pt has been advised that she may meed a short course of steroids to get flare up under control then proceed with maintenance medication. Pt is scheduled to see Dr. Meridee Score in April. Pt wanted to know if you could do a virtual visit with her sooner? Patient is in school and not able to come into the office. Please advise, thanks

## 2023-08-22 NOTE — Telephone Encounter (Signed)
Inbound call from patient returning phone call. Requesting a call back. Please advise, thank you.

## 2023-08-22 NOTE — Telephone Encounter (Signed)
Patient is requesting to speak with a nurse for message below.

## 2023-08-22 NOTE — Telephone Encounter (Signed)
Left message on machine to call back

## 2023-08-23 ENCOUNTER — Other Ambulatory Visit: Payer: Self-pay

## 2023-08-23 DIAGNOSIS — K51919 Ulcerative colitis, unspecified with unspecified complications: Secondary | ICD-10-CM

## 2023-08-23 MED ORDER — PREDNISONE 10 MG PO TABS: ORAL_TABLET | ORAL | 0 refills | Status: AC

## 2023-08-23 MED ORDER — TRAMADOL HCL 50 MG PO TABS: 50.0000 mg | ORAL_TABLET | Freq: Four times a day (QID) | ORAL | 0 refills | Status: DC | PRN

## 2023-08-24 ENCOUNTER — Other Ambulatory Visit: Payer: Medicaid Other

## 2023-08-24 ENCOUNTER — Other Ambulatory Visit (HOSPITAL_BASED_OUTPATIENT_CLINIC_OR_DEPARTMENT_OTHER): Payer: Self-pay | Admitting: Obstetrics & Gynecology

## 2023-08-24 ENCOUNTER — Ambulatory Visit: Payer: Medicaid Other | Admitting: Gastroenterology

## 2023-08-24 ENCOUNTER — Encounter: Payer: Self-pay | Admitting: Gastroenterology

## 2023-08-24 ENCOUNTER — Encounter (HOSPITAL_BASED_OUTPATIENT_CLINIC_OR_DEPARTMENT_OTHER): Payer: Self-pay | Admitting: Certified Nurse Midwife

## 2023-08-24 VITALS — BP 110/64 | HR 69 | Ht 66.0 in | Wt 150.2 lb

## 2023-08-24 DIAGNOSIS — K5190 Ulcerative colitis, unspecified, without complications: Secondary | ICD-10-CM | POA: Diagnosis not present

## 2023-08-24 DIAGNOSIS — R509 Fever, unspecified: Secondary | ICD-10-CM

## 2023-08-24 DIAGNOSIS — K921 Melena: Secondary | ICD-10-CM

## 2023-08-24 DIAGNOSIS — R1030 Lower abdominal pain, unspecified: Secondary | ICD-10-CM

## 2023-08-24 DIAGNOSIS — K51511 Left sided colitis with rectal bleeding: Secondary | ICD-10-CM

## 2023-08-24 DIAGNOSIS — R195 Other fecal abnormalities: Secondary | ICD-10-CM

## 2023-08-24 LAB — C-REACTIVE PROTEIN: CRP: <1 mg/dL (ref 0.5–20.0)

## 2023-08-24 LAB — COMPREHENSIVE METABOLIC PANEL
ALT: 9 U/L (ref 0–35)
AST: 14 U/L (ref 0–37)
Albumin: 4.5 g/dL (ref 3.5–5.2)
Alkaline Phosphatase: 52 U/L (ref 39–117)
BUN: 8 mg/dL (ref 6–23)
CO2: 28 meq/L (ref 19–32)
Calcium: 9.7 mg/dL (ref 8.4–10.5)
Chloride: 103 meq/L (ref 96–112)
Creatinine, Ser: 0.65 mg/dL (ref 0.40–1.20)
GFR: 123.29 mL/min (ref >60.00)
Glucose, Bld: 80 mg/dL (ref 70–99)
Potassium: 3.7 meq/L (ref 3.5–5.1)
Sodium: 140 meq/L (ref 135–145)
Total Bilirubin: 0.3 mg/dL (ref 0.2–1.2)
Total Protein: 7.4 g/dL (ref 6.0–8.3)

## 2023-08-24 LAB — CBC
HCT: 43 % (ref 36.0–46.0)
Hemoglobin: 14.4 g/dL (ref 12.0–15.0)
MCHC: 33.4 g/dL (ref 30.0–36.0)
MCV: 103.1 fL — ABNORMAL HIGH (ref 78.0–100.0)
Platelets: 360 10*3/uL (ref 150.0–400.0)
RBC: 4.18 Mil/uL (ref 3.87–5.11)
RDW: 12.1 % (ref 11.5–15.5)
WBC: 8.7 10*3/uL (ref 4.0–10.5)

## 2023-08-24 LAB — SEDIMENTATION RATE: Sed Rate: 17 mm/h (ref 0–20)

## 2023-08-24 NOTE — Progress Notes (Signed)
Chief Complaint: Ulcerative pancolitis Primary GI MD: Dr. Meridee Score  HPI: 23 year old female history of ulcerative pancolitis presents for flareup  Review of pertinent gastrointestinal problems: 1.  Indeterminate colitis (left-sided segmental).  Diarrhea, minor bleeding led to colonoscopy 06/2021 terminal ileum not intubated due to tortuosity, no evidence of strictures or inflammation.  Right colon was normal, segmental inflammation in the left colon including distal rectum and also for a 20 cm segment in the sigmoid.  Multiple biopsies taken from throughout and path showed chronic inflammation in the left sided segmental changes.  Lialda 4 pills once daily started, also Rowasa enema once daily started.  CT enteroscopy 10/2021 "severe circumferential wall thickening and hyperenhancement involving the descending colon through the rectum." Labs 10/2021: Hepatitis B surface antigen negative, Quantifuron gold TB testing negative, hepatitis B surface Humira new start 12/2021: Personally wanted to go back on Lialda 4 tabs a day and did this in November 2023, has been maintained on that since and doing well 2.  Acute norovirus infection 10/2021 3. Stopped Lialda 10/2022 and was doing well until 06/2023 when she had flare up and reported fecal calprotectin of 6,770 at outside provider  Patient was last seen by Hyacinth Meeker, PA-C March 2024.  At that time she was doing well on the Lialda and having soft bowel movements daily  Per phone note 08/21/2023 patient called and stated she stopped her the Lialda April 2024 and was doing fine until December 2024 when she began having colitis symptoms characterized by liquid bloody stool and abdominal pain.  She saw a provider who ordered a fecal calprotectin and it was reportedly [id number] 08/10/2023.  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  History of Present Illness   The patient, with ulcerative colitis, presents with a flare-up of symptoms.  The flare-up of ulcerative colitis symptoms began mildly in December and worsened significantly in January. Symptoms include abdominal pain and approximately ten bloody bowel movements per day, leading to spending most of the day in the bathroom.  She had previously stopped taking Lialda in April and was doing well until December. Due to worsening symptoms, she resumed Lialda on January 28th. A Humira test was positive for antibodies, indicating she cannot return to this medication. A fecal calprotectin test done in January showed a very high level of 6,770. 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  She feels feverish and experiences excessive sweating and weakness. She describes feeling 'hot' and has been using air conditioning to manage these symptoms, which have been present since the flare-up intensified in January.  She has a history of being diagnosed with ulcerative colitis, although there has been some confusion with a previous suggestion of Crohn's disease. She has been seen by various healthcare providers, including the [place], but has had difficulty scheduling appointments due to her school commitments.      Past Medical History:  Diagnosis Date   Asthma    UC (ulcerative colitis) [facility])     Past Surgical History:  Procedure Laterality Date  TONSILLECTOMY AND ADENOIDECTOMY     WISDOM TOOTH EXTRACTION      Current Outpatient Medications  Medication Sig Dispense Refill   acetaminophen (TYLENOL) 325 MG tablet Take 2 tablets (650 mg total) by  mouth every 6 (six) hours as needed for moderate pain. 30 tablet 0   cetirizine (ZYRTEC ALLERGY) 10 MG tablet Take 1 tablet (10 mg total) by mouth daily. 30 tablet 0   fluticasone (FLONASE) 50 MCG/ACT nasal spray Place 2 sprays into both nostrils daily. (Patient taking differently: Place 2 sprays into both nostrils daily as needed for allergies.) 16 g 0   mesalamine (LIALDA) 1.2 g EC tablet Take 4 tablets (4.8 g total) by mouth daily with breakfast. 120 tablet 11   predniSONE (DELTASONE) 10 MG tablet Take 4 tablets (40 mg total) by mouth daily with breakfast for 7 days, THEN 3.5 tablets (35 mg total) daily with breakfast for 7 days, THEN 3 tablets (30 mg total) daily with breakfast for 7 days, THEN 2.5 tablets (25 mg total) daily with breakfast for 7 days, THEN 2 tablets (20 mg total) daily with breakfast for 7 days, THEN 1.5 tablets (15 mg total) daily with breakfast for 7 days, THEN 1 tablet (10 mg total) daily with breakfast for 7 days, THEN 0.5 tablets (5 mg total) daily with breakfast for 7 days. 126 tablet 0   prenatal vitamin w/FE, FA (PRENATAL 1 + 1) 27-1 MG TABS tablet Take 1 tablet by mouth daily at 12 noon. 100 tablet 5   Probiotic Product (PROBIOTIC PO) Take 1 capsule by mouth daily.     traMADol (ULTRAM) 50 MG tablet Take 1 tablet (50 mg total) by mouth every 6 (six) hours as needed for moderate pain (pain score 4-6). 20 tablet 0   No current facility-administered medications for this visit.    Allergies as of 08/24/2023   (No Known Allergies)    Family History  Problem Relation Age of Onset   Breast cancer Paternal Grandmother    Diabetes Paternal Grandmother    Colon polyps Maternal Grandmother    Asthma Father    Diabetes Mother    Hypertension Mother    Asthma Mother    Fibroids Mother    Asthma Sister    Colon cancer Neg Hx    Esophageal cancer Neg Hx    Rectal cancer Neg Hx    Stomach cancer Neg Hx     Social History   Socioeconomic History   Marital status:  Single    Spouse name: Not on file   Number of children: 0   Years of education: Not on file   Highest education level: Not on file  Occupational History   Not on file  Tobacco Use   Smoking status: Never   Smokeless tobacco: Never  Vaping Use   Vaping status: Every Day   Substances: Nicotine, Flavoring  Substance and Sexual Activity   Alcohol use: Not Currently   Drug use: Never   Sexual activity: Yes    Birth control/protection: None  Other Topics Concern   Not on file  Social History Narrative   Not on file   Social Drivers of Health   Financial Resource Strain: Not on file  Food Insecurity: No Food Insecurity (12/07/2021)   Hunger Vital Sign    Worried About Running Out of Food in the Last Year: Never true    Ran Out of Food in the Last Year: Never true  Transportation Needs: No Transportation Needs (08/07/2022)  PRAPARE - Administrator, Civil Service (Medical): No    Lack of Transportation (Non-Medical): No  Physical Activity: Not on file  Stress: Stress Concern Present (09/06/2022)   [employer] of Occupational Health - Occupational Stress Questionnaire    Feeling of Stress : Rather much  Social Connections: Not on file  Intimate Partner Violence: Not on file    Review of Systems:    Constitutional: No weight loss, fever, chills, weakness or fatigue HEENT: Eyes: No change in vision               Ears, Nose, Throat:  No change in hearing or congestion Skin: No rash or itching Cardiovascular: No chest pain, chest pressure or palpitations   Respiratory: No SOB or cough Gastrointestinal: See HPI and otherwise negative Genitourinary: No dysuria or change in urinary frequency Neurological: No headache, dizziness or syncope Musculoskeletal: No new muscle or joint pain Hematologic: No bleeding or bruising Psychiatric: No history of depression or anxiety    Physical Exam:  Vital signs: BP 110/64   Pulse 69   Ht 5\' 6"  (1.676 m)   Wt 150 lb 4  oz (68.2 kg)   BMI 24.25 kg/m   Constitutional: NAD, Well developed, Well nourished, alert and cooperative Head:  Normocephalic and atraumatic. Eyes:   PEERL, EOMI. No icterus. Conjunctiva pink. Respiratory: Respirations even and unlabored. Lungs clear to auscultation bilaterally.   No wheezes, crackles, or rhonchi.  Cardiovascular:  Regular rate and rhythm. No peripheral edema, cyanosis or pallor.  Gastrointestinal:  Soft, nondistended, generalized abdominal tenderness. No rebound or guarding. Normal bowel sounds. No appreciable masses or hepatomegaly. Rectal:  Not performed.  Msk:  Symmetrical without gross deformities. Without edema, no deformity or joint abnormality.  Neurologic:  Alert and  oriented x4;  grossly normal neurologically.  Skin:   Dry and intact without significant lesions or rashes. Psychiatric: Oriented to person, place and time. Demonstrates good judgement and reason without abnormal affect or behaviors.   RELEVANT LABS AND IMAGING: CBC    Component Value Date/Time   WBC 11.2 (H) 01/26/2023 2103   RBC 4.70 01/26/2023 2103   HGB 16.0 (H) 01/26/2023 2103   HCT 48.0 (H) 01/26/2023 2103   PLT 332 01/26/2023 2103   MCV 102.1 (H) 01/26/2023 2103   MCH 34.0 01/26/2023 2103   MCHC 33.3 01/26/2023 2103   RDW 11.9 01/26/2023 2103   LYMPHSABS 3.0 01/26/2023 2103   MONOABS 0.5 01/26/2023 2103   EOSABS 0.2 01/26/2023 2103   BASOSABS 0.1 01/26/2023 2103    CMP     Component Value Date/Time   NA 140 04/13/2022 0929   K 4.0 04/13/2022 0929   CL 104 04/13/2022 0929   CO2 28 04/13/2022 0929   GLUCOSE 76 04/13/2022 0929   BUN 6 04/13/2022 0929   CREATININE 0.70 04/13/2022 0929   CALCIUM 9.2 04/13/2022 0929   PROT 7.1 04/13/2022 0929   ALBUMIN 4.0 04/13/2022 0929   AST 11 04/13/2022 0929   ALT 10 04/13/2022 0929   ALKPHOS 38 (L) 04/13/2022 0929   BILITOT 0.2 04/13/2022 0929   GFRNONAA >60 05/27/2021 0820     Assessment/Plan:      Ulcerative  Colitis Flare-up with bloody diarrhea and abdominal pain since December, worsened in January. High fecal calprotectin and positive for Humira antibodies. Restarted Lialda on 08/21/2023. 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 --Order stool studies to rule out infection, including C.  diff, given patient's immunocompromised status and hospital work environment. --If stool studies are negative, initiate prednisone taper (40mg  for 4 weeks). --Continue Lialda. -- consider colonoscopy once flare is managed. -Discuss alternative treatment options with Dr. Meridee Score at next appt currently scheduled in April     Boone Master, [place] [facility] 08/24/2023, 3:51 PM  Cc: [facility]*

## 2023-08-24 NOTE — Patient Instructions (Signed)
Your provider has ordered "[employer]athe[employer]" stool testing for you. You have received a kit from our office today containing all necessary supplies to complete this test. Please carefully read the stool collection instructions provided in the kit before opening the accompanying materials. In addition, be sure to place the label from the top right corner of the laboratory request sheet onto the "puritan opti-swab" tube that is supplied in the kit. This label should include your full name and date of birth. After completing the test, you should secure the purtian tube into the specimen biohazard bag. The laboratory request information sheet (including date and time of specimen collection) should be placed into the outside pocket of the specimen biohazard bag and returned to the [facility] lab with 2 days of collection.   If the laboratory information sheet specimen date and time are not filled out, the test will NOT be performed.   Due to recent changes in healthcare laws, you may see the results of your imaging and laboratory studies on MyChart before your provider has had a chance to review them.  We understand that in some cases there may be results that are confusing or concerning to you. Not all laboratory results come back in the same time frame and the provider may be waiting for multiple results in order to interpret others.  Please give [place] 48 hours in order for your provider to thoroughly review all the results before contacting the office for clarification of your results.    _______________________________________________________  If your blood pressure at your visit was 140/90 or greater, please contact your primary care physician to follow up on this.  _______________________________________________________  If you are age 81 or older, your body mass index should be between 23-30. Your Body mass index is 24.25 kg/m. If this is out of the aforementioned range listed, please consider follow up with  your Primary Care Provider.  If you are age 88 or younger, your body mass index should be between 19-25. Your Body mass index is 24.25 kg/m. If this is out of the aformentioned range listed, please consider follow up with your Primary Care Provider.   ________________________________________________________  The [place] [facility] providers would like to encourage you to use [facility] to communicate with providers for non-urgent requests or questions.  Due to long hold times on the telephone, sending your provider a message by [facility] may be a faster and more efficient way to get a response.  Please allow 48 business hours for a response.  Please remember that this is for non-urgent requests.  _______________________________________________________   I appreciate the  opportunity to care for you  Thank You   [facility]

## 2023-08-27 ENCOUNTER — Encounter (HOSPITAL_BASED_OUTPATIENT_CLINIC_OR_DEPARTMENT_OTHER): Payer: Self-pay | Admitting: Certified Nurse Midwife

## 2023-08-27 ENCOUNTER — Other Ambulatory Visit: Payer: Medicaid Other

## 2023-08-27 DIAGNOSIS — K921 Melena: Secondary | ICD-10-CM | POA: Diagnosis not present

## 2023-08-27 DIAGNOSIS — K51511 Left sided colitis with rectal bleeding: Secondary | ICD-10-CM | POA: Diagnosis not present

## 2023-08-27 DIAGNOSIS — R509 Fever, unspecified: Secondary | ICD-10-CM | POA: Diagnosis not present

## 2023-08-27 DIAGNOSIS — R195 Other fecal abnormalities: Secondary | ICD-10-CM | POA: Diagnosis not present

## 2023-08-27 DIAGNOSIS — K51919 Ulcerative colitis, unspecified with unspecified complications: Secondary | ICD-10-CM | POA: Diagnosis not present

## 2023-08-27 DIAGNOSIS — R1030 Lower abdominal pain, unspecified: Secondary | ICD-10-CM | POA: Diagnosis not present

## 2023-08-28 LAB — GI PROFILE, STOOL, PCR

## 2023-08-29 ENCOUNTER — Encounter (HOSPITAL_BASED_OUTPATIENT_CLINIC_OR_DEPARTMENT_OTHER): Payer: Self-pay | Admitting: Certified Nurse Midwife

## 2023-08-29 NOTE — Progress Notes (Signed)
Attending Physician's Attestation   I have reviewed the chart.   I agree with the Advanced Practitioner's note, impression, and recommendations with any updates as below. Will need to review the outside records that suggest/show Humira Antibodies.  When they have returned please scan into chart for review as well.  Rule out infectious etiologies is reasonable and possible need for steroids.  Updated Colonoscopy will need to be considered as well.  If true Humira antibodies, then consider Remicade vs other agents with use/consideration of Thiopurine to decrease risk of recurrent antibody development in future.   Corliss Parish, MD [facility] Advanced [facility]oscopy Office # [phone]

## 2023-08-30 MED ORDER — MESALAMINE 1.2 G PO TBEC: 4.8000 g | DELAYED_RELEASE_TABLET | Freq: Every day | ORAL | 2 refills | Status: DC

## 2023-08-31 ENCOUNTER — Telehealth: Payer: Medicaid Other | Admitting: Emergency Medicine

## 2023-08-31 DIAGNOSIS — R6889 Other general symptoms and signs: Secondary | ICD-10-CM

## 2023-08-31 MED ORDER — BENZONATATE 100 MG PO CAPS: 100.0000 mg | ORAL_CAPSULE | Freq: Two times a day (BID) | ORAL | 0 refills | Status: DC | PRN

## 2023-08-31 NOTE — Progress Notes (Signed)
 E visit for Flu like symptoms   We are sorry that you are not feeling well.  Here is how we plan to help! Based on what you have shared with me it looks like you may have a respiratory virus that may be influenza.  Unfortunately, due to the length of time you've been sick, you are outside of the treatment window for Tamiflu (antiviral).  Please try see the instructions below.  Influenza or "the flu" is   an infection caused by a respiratory virus. The flu virus is highly contagious and persons who did not receive their yearly flu vaccination may "catch" the flu from close contact.  We have anti-viral medications to treat the viruses that cause this infection. They are not a "cure" and only shorten the course of the infection. These prescriptions are most effective when they are given within the first 2 days of "flu" symptoms. Antiviral medication are indicated if you have a high risk of complications from the flu. You should  also consider an antiviral medication if you are in close contact with someone who is at risk. These medications can help patients avoid complications from the flu  but have side effects that you should know. Possible side effects from Tamiflu or oseltamivir include nausea, vomiting, diarrhea, dizziness, headaches, eye redness, sleep problems or other respiratory symptoms. You should not take Tamiflu if you have an allergy to oseltamivir or any to the ingredients in Tamiflu.  Based upon your symptoms and potential risk factors I recommend that you follow the flu symptoms recommendation that I have listed below.  I've prescribed Tessalon  for cough.  ANYONE WHO HAS FLU SYMPTOMS SHOULD: Stay home. The flu is highly contagious and going out or to work exposes others! Be sure to drink plenty of fluids. Water is fine as well as fruit juices, sodas and electrolyte beverages. You may want to stay away from caffeine or alcohol. If you are nauseated, try taking small sips of liquids. How  do you know if you are getting enough fluid? Your urine should be a pale yellow or almost colorless. Get rest. Taking a steamy shower or using a humidifier may help nasal congestion and ease sore throat pain. Using a saline nasal spray works much the same way. Cough drops, hard candies and sore throat lozenges may ease your cough. Line up a caregiver. Have someone check on you regularly.   GET HELP RIGHT AWAY IF: You cannot keep down liquids or your medications. You become short of breath Your fell like you are going to pass out or loose consciousness. Your symptoms persist after you have completed your treatment plan MAKE SURE YOU  Understand these instructions. Will watch your condition. Will get help right away if you are not doing well or get worse.  Your e-visit answers were reviewed by a board certified advanced clinical practitioner to complete your personal care plan.  Depending on the condition, your plan could have included both over the counter or prescription medications.  If there is a problem please reply  once you have received a response from your provider.  Your safety is important to us .  If you have drug allergies check your prescription carefully.    You can use MyChart to ask questions about today's visit, request a non-urgent call back, or ask for a work or school excuse for 24 hours related to this e-Visit. If it has been greater than 24 hours you will need to follow up with your provider, or  enter a new e-Visit to address those concerns.  You will get an e-mail in the next two days asking about your experience.  I hope that your e-visit has been valuable and will speed your recovery. Thank you for using e-visits.  Approximately 5 minutes was used in reviewing the patient's chart, questionnaire, prescribing medications, and documentation.

## 2023-09-03 ENCOUNTER — Telehealth (HOSPITAL_BASED_OUTPATIENT_CLINIC_OR_DEPARTMENT_OTHER): Payer: Self-pay | Admitting: Certified Nurse Midwife

## 2023-09-03 ENCOUNTER — Other Ambulatory Visit (HOSPITAL_BASED_OUTPATIENT_CLINIC_OR_DEPARTMENT_OTHER): Payer: Self-pay | Admitting: Certified Nurse Midwife

## 2023-09-03 ENCOUNTER — Encounter (HOSPITAL_BASED_OUTPATIENT_CLINIC_OR_DEPARTMENT_OTHER): Payer: Self-pay | Admitting: Certified Nurse Midwife

## 2023-09-03 LAB — HEPATITIS C ANTIBODY: Hep C Virus Ab: NONREACTIVE

## 2023-09-03 LAB — RPR: RPR Ser Ql: NONREACTIVE

## 2023-09-03 LAB — HEPATITIS B SURFACE ANTIGEN: Hepatitis B Surface Ag: NEGATIVE

## 2023-09-03 LAB — HIV ANTIBODY (ROUTINE TESTING W REFLEX): HIV Screen 4th Generation wRfx: REACTIVE

## 2023-09-03 LAB — TESTOSTERONE: Testosterone: 32 ng/dL (ref 13–71)

## 2023-09-03 LAB — HIV 1/2 AB DIFFERENTIATION
HIV 1 Ab: NONREACTIVE
HIV 2 Ab: NONREACTIVE

## 2023-09-03 NOTE — Telephone Encounter (Signed)
 CNM telephoned [facility]. Patient requests that False positive HIV results be removed from her medical record.

## 2023-09-03 NOTE — Telephone Encounter (Signed)
 [facility] states that lab result cannot be removed from [facility] or the medical record.  Lacey Perez

## 2023-10-30 ENCOUNTER — Ambulatory Visit: Payer: Medicaid Other | Admitting: Gastroenterology

## 2023-12-24 ENCOUNTER — Ambulatory Visit
Admission: EM | Admit: 2023-12-24 | Discharge: 2023-12-24 | Disposition: A | Discharge status: Home / Self Care | Attending: Family Medicine | Admitting: Family Medicine

## 2023-12-24 DIAGNOSIS — L02216 Cutaneous abscess of umbilicus: Secondary | ICD-10-CM

## 2023-12-24 MED ORDER — DOXYCYCLINE HYCLATE 100 MG PO CAPS
100.0000 mg | ORAL_CAPSULE | Freq: Two times a day (BID) | ORAL | 0 refills | Status: DC
Start: 2023-12-24 — End: 2024-04-02

## 2023-12-24 NOTE — ED Triage Notes (Signed)
 Patient presents to the office for belly button irrigation after a piercing x 6 months.

## 2023-12-24 NOTE — ED Provider Notes (Signed)
 [name]  Note:  This document was prepared using Conservation officer, historic buildings and may include unintentional dictation errors.  MRN: [id number] DOB: May 20, 2001  Subjective:   Lacey Perez is a 23 y.o. female presenting for 2 to 3-day history of pain about the bellybutton from a piercing on the lower side.  She has since removed it.  She did notice that it drained but would like to have an incision and drainage done today.  No fever, nausea, vomiting.  No current facility-administered medications for this encounter.  Current Outpatient Medications:    acetaminophen  (TYLENOL ) 325 MG tablet, Take 2 tablets (650 mg total) by mouth every 6 (six) hours as needed for moderate pain., Disp: 30 tablet, Rfl: 0   cetirizine  (ZYRTEC  ALLERGY) 10 MG tablet, Take 1 tablet (10 mg total) by mouth daily., Disp: 30 tablet, Rfl: 0   fluticasone  (FLONASE ) 50 MCG/ACT nasal spray, Place 2 sprays into both nostrils daily. (Patient taking differently: Place 2 sprays into both nostrils daily as needed for allergies.), Disp: 16 g, Rfl: 0   mesalamine  (LIALDA ) 1.2 g EC tablet, Take 4 tablets (4.8 g total) by mouth daily with breakfast., Disp: 120 tablet, Rfl: 2   prenatal vitamin w/FE, FA (PRENATAL 1 + 1) 27-1 MG TABS tablet, Take 1 tablet by mouth daily at 12 noon., Disp: 100 tablet, Rfl: 5   Probiotic Product (PROBIOTIC PO), Take 1 capsule by mouth daily., Disp: , Rfl:    benzonatate  (TESSALON ) 100 MG capsule, Take 1 capsule (100 mg total) by mouth 2 (two) times daily as needed for cough., Disp: 20 capsule, Rfl: 0   traMADol  (ULTRAM ) 50 MG tablet, Take 1 tablet (50 mg total) by mouth every 6 (six) hours as needed for moderate pain (pain score 4-6)., Disp: 20 tablet, Rfl: 0   No Known Allergies  Past Medical History:  Diagnosis Date   Asthma    UC (ulcerative colitis) (HCC)      Past Surgical History:  Procedure Laterality Date   TONSILLECTOMY AND ADENOIDECTOMY     WISDOM  TOOTH EXTRACTION      Family History  Problem Relation Age of Onset   Breast cancer Paternal Grandmother    Diabetes Paternal Grandmother    Colon polyps Maternal Grandmother    Asthma Father    Diabetes Mother    Hypertension Mother    Asthma Mother    Fibroids Mother    Asthma Sister    Colon cancer Neg Hx    Esophageal cancer Neg Hx    Rectal cancer Neg Hx    Stomach cancer Neg Hx     Social History   Tobacco Use   Smoking status: Never   Smokeless tobacco: Never  Vaping Use   Vaping status: Every Day   Substances: Nicotine, Flavoring  Substance Use Topics   Alcohol use: Not Currently   Drug use: Never    ROS   Objective:   Vitals: BP (!) 128/92 (BP Location: Left Arm)   Pulse 64   Temp 98.5 F (36.9 C) (Oral)   Resp 16   LMP 12/16/2023 (Approximate)   SpO2 99%   Physical Exam Constitutional:      General: She is not in acute distress.    Appearance: Normal appearance. She is well-developed. She is not ill-appearing, toxic-appearing or diaphoretic.  HENT:     Head: Normocephalic and atraumatic.     Nose: Nose normal.     Mouth/Throat:  Mouth: Mucous membranes are moist.  Eyes:     General: No scleral icterus.       Right eye: No discharge.        Left eye: No discharge.     Extraocular Movements: Extraocular movements intact.  Cardiovascular:     Rate and Rhythm: Normal rate.  Pulmonary:     Effort: Pulmonary effort is normal.  Abdominal:    Skin:    General: Skin is warm and dry.  Neurological:     General: No focal deficit present.     Mental Status: She is alert and oriented to person, place, and time.  Psychiatric:        Mood and Affect: Mood normal.        Behavior: Behavior normal.     An 18-gauge needle was used to lance the small area concerning for abscess.  No drainage was expressed.  Wound cleansed and dressed.  Assessment and Plan :   PDMP not reviewed this encounter.  1. Abscess, umbilical    Start doxycycline  for the resolving abscess.  Wound care reviewed.  Counseled patient on potential for adverse effects with medications prescribed/recommended today, ER and return-to-clinic precautions discussed, patient verbalized understanding.    Adolph Hoop, [place] 12/24/23 1919

## 2023-12-24 NOTE — Discharge Instructions (Addendum)
  Start doxcycyline for the infection. Please change your dressing 2-3 times daily. Each time you change your dressing, make sure that you are pressing on the wound to get pus to come out.  Try your best to clean the wound with antibacterial soap and warm water. Pat your wound dry and let it air out if possible to make sure it is dry before reapplying another dressing.

## 2024-01-11 ENCOUNTER — Ambulatory Visit: Admitting: Gastroenterology

## 2024-01-31 ENCOUNTER — Telehealth: Payer: Self-pay | Admitting: Gastroenterology

## 2024-01-31 ENCOUNTER — Other Ambulatory Visit: Payer: Self-pay

## 2024-01-31 MED ORDER — MESALAMINE 1.2 G PO TBEC: 4.8000 g | DELAYED_RELEASE_TABLET | Freq: Every day | ORAL | 2 refills | Status: AC

## 2024-01-31 NOTE — Telephone Encounter (Signed)
 Inbound call from patient stated she needs a prescription for Mesalamine  medication sent to [facility] in [place]. Patient also stated she's been out for a few days now.  Please advise Thank you

## 2024-01-31 NOTE — Telephone Encounter (Signed)
Prescription has been refilled.

## 2024-02-04 ENCOUNTER — Ambulatory Visit
Admission: EM | Admit: 2024-02-04 | Discharge: 2024-02-04 | Disposition: A | Discharge status: Home / Self Care | Attending: Family Medicine | Admitting: Family Medicine

## 2024-02-04 DIAGNOSIS — M79671 Pain in right foot: Secondary | ICD-10-CM

## 2024-02-04 DIAGNOSIS — G8929 Other chronic pain: Secondary | ICD-10-CM

## 2024-02-04 MED ORDER — DICLOFENAC SODIUM 3 % EX GEL: 1.0000 | Freq: Two times a day (BID) | CUTANEOUS | 0 refills | Status: DC

## 2024-02-04 MED ORDER — NAPROXEN 375 MG PO TABS: 375.0000 mg | ORAL_TABLET | Freq: Two times a day (BID) | ORAL | 0 refills | Status: DC
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
## 2024-02-04 NOTE — Discharge Instructions (Addendum)
I have placed orders to have an x-ray done at the med center in [place].  Please had there now.  Go through the main hospital and not the emergency room.  Once you are there and let them know that you will came to our clinic and we send she to their facility for an outpatient x-ray.  If no one is at the front desk then they are likely out the rest of the day and at that point you would have to go through the emergency room.  Do not check in as a patient through the emergency room.  Simply let them know that you are there for an outpatient x-ray from our clinic.  I will call you with your results and update our treatment plan if necessary after I get the report.  Please wait to go pick up your prescriptions for any medications I have prescribed for you until after we discussed your x-ray results.  

## 2024-02-11 ENCOUNTER — Encounter: Payer: Self-pay | Admitting: Gastroenterology

## 2024-02-11 ENCOUNTER — Ambulatory Visit: Admitting: Podiatry

## 2024-02-12 ENCOUNTER — Emergency Department (HOSPITAL_BASED_OUTPATIENT_CLINIC_OR_DEPARTMENT_OTHER): Admission: EM | Admit: 2024-02-12 | Discharge: 2024-02-12 | Disposition: A | Discharge status: Home / Self Care

## 2024-02-12 ENCOUNTER — Emergency Department (HOSPITAL_BASED_OUTPATIENT_CLINIC_OR_DEPARTMENT_OTHER)

## 2024-02-12 ENCOUNTER — Other Ambulatory Visit: Payer: Self-pay

## 2024-02-12 ENCOUNTER — Encounter (HOSPITAL_BASED_OUTPATIENT_CLINIC_OR_DEPARTMENT_OTHER): Payer: Self-pay

## 2024-02-12 DIAGNOSIS — K529 Noninfective gastroenteritis and colitis, unspecified: Secondary | ICD-10-CM | POA: Diagnosis not present

## 2024-02-12 DIAGNOSIS — K51911 Ulcerative colitis, unspecified with rectal bleeding: Secondary | ICD-10-CM | POA: Insufficient documentation

## 2024-02-12 DIAGNOSIS — Z7951 Long term (current) use of inhaled steroids: Secondary | ICD-10-CM | POA: Diagnosis not present

## 2024-02-12 DIAGNOSIS — Z7952 Long term (current) use of systemic steroids: Secondary | ICD-10-CM | POA: Insufficient documentation

## 2024-02-12 DIAGNOSIS — R109 Unspecified abdominal pain: Secondary | ICD-10-CM | POA: Diagnosis not present

## 2024-02-12 DIAGNOSIS — R1030 Lower abdominal pain, unspecified: Secondary | ICD-10-CM | POA: Diagnosis present

## 2024-02-12 DIAGNOSIS — K5190 Ulcerative colitis, unspecified, without complications: Secondary | ICD-10-CM | POA: Diagnosis not present

## 2024-02-12 DIAGNOSIS — K625 Hemorrhage of anus and rectum: Secondary | ICD-10-CM | POA: Diagnosis not present

## 2024-02-12 DIAGNOSIS — J45909 Unspecified asthma, uncomplicated: Secondary | ICD-10-CM | POA: Diagnosis not present

## 2024-02-12 LAB — COMPREHENSIVE METABOLIC PANEL WITH GFR
ALT: 10 U/L (ref 0–44)
AST: 19 U/L (ref 15–41)
Albumin: 4.6 g/dL (ref 3.5–5.0)
Alkaline Phosphatase: 46 U/L (ref 38–126)
Anion gap: 13 (ref 5–15)
BUN: 8 mg/dL (ref 6–20)
CO2: 22 mmol/L (ref 22–32)
Calcium: 9.7 mg/dL (ref 8.9–10.3)
Chloride: 104 mmol/L (ref 98–111)
Creatinine, Ser: 0.66 mg/dL (ref 0.44–1.00)
GFR, Estimated: >60 mL/min (ref >60)
Glucose, Bld: 84 mg/dL (ref 70–99)
Potassium: 3.8 mmol/L (ref 3.5–5.1)
Sodium: 139 mmol/L (ref 135–145)
Total Bilirubin: 0.4 mg/dL (ref 0.0–1.2)
Total Protein: 7.6 g/dL (ref 6.5–8.1)

## 2024-02-12 LAB — URINALYSIS, ROUTINE W REFLEX MICROSCOPIC
Bacteria, UA: NONE SEEN
Bilirubin Urine: NEGATIVE
Glucose, UA: NEGATIVE mg/dL
Ketones, ur: NEGATIVE mg/dL
Leukocytes,Ua: NEGATIVE
Nitrite: NEGATIVE
Protein, ur: NEGATIVE mg/dL
Specific Gravity, Urine: 1.032 — ABNORMAL HIGH (ref 1.005–1.030)
pH: 5.5 (ref 5.0–8.0)

## 2024-02-12 LAB — CBC
HCT: 42.3 % (ref 36.0–46.0)
Hemoglobin: 14.1 g/dL (ref 12.0–15.0)
MCH: 34 pg (ref 26.0–34.0)
MCHC: 33.3 g/dL (ref 30.0–36.0)
MCV: 101.9 fL — ABNORMAL HIGH (ref 80.0–100.0)
Platelets: 289 K/uL (ref 150–400)
RBC: 4.15 MIL/uL (ref 3.87–5.11)
RDW: 12.2 % (ref 11.5–15.5)
WBC: 9.4 K/uL (ref 4.0–10.5)
nRBC: 0 % (ref 0.0–0.2)

## 2024-02-12 LAB — LIPASE, BLOOD: Lipase: 31 U/L (ref 11–51)

## 2024-02-12 LAB — PREGNANCY, URINE: Preg Test, Ur: NEGATIVE

## 2024-02-12 MED ORDER — PREDNISONE 50 MG PO TABS: 50.0000 mg | ORAL_TABLET | Freq: Every day | ORAL | 0 refills | Status: AC

## 2024-02-12 MED ORDER — IOHEXOL 300 MG/ML  SOLN
100.0000 mL | Freq: Once | INTRAMUSCULAR | Status: AC | PRN
  Administered 2024-02-12: 100 mL via INTRAVENOUS

## 2024-02-12 MED ORDER — PREDNISONE 50 MG PO TABS
50.0000 mg | ORAL_TABLET | Freq: Once | ORAL | Status: AC
  Administered 2024-02-12: 50 mg via ORAL
  Filled 2024-02-12: qty 1

## 2024-02-12 NOTE — Discharge Instructions (Addendum)
 You were seen today for an ulcerative colitis flareup.  CT scan showed likely ulcerative colitis with reassuring findings otherwise.  Will send with a 5-day course of prednisone , reach out to your GI doctor and have them either represcribe or manage your taper.  He will need to follow-up with them as soon as possible.  Return to the ED if you intervene new or worsening symptoms which include worsening rectal bleeding accompanied with increased abdominal pain, shortness of breath, increased fatigue, dizziness, headache, or fever.

## 2024-02-12 NOTE — ED Provider Notes (Signed)
 [facility]ENC[facility] DEPARTMENT AT [facility] Provider Note   CSN: [id number] Arrival date & time: 02/12/24  1831     Patient presents with: Abdominal Pain and Blood In Stools   Lacey Perez is a 23 y.o. female.   Abdominal Pain  Patient is a 23 year old female to the ED today with concerns for lower abdominal pain and bright red blood per rectum for the last 2 days with a previous history of ulcerative colitis, asthma.  Notes that she had reached out to her GI doctor and has not been scheduled for an appointment till August 4 and was told to come to the ED due to the bleeding.  Has been taking her Lialda  but has missed approximately 5 doses over the course of the last 2 weeks secondary to not getting a refill and forgetting.  Noticed to have 2-3 bloody stools a day for the last 2 days.  Denies fever, headache, chest pain, shortness of breath, nausea, vomiting, diarrhea, melena, dysuria, hematuria, vaginal discharge, vaginal bleeding, vaginal pain, lower leg swelling.    Prior to Admission medications   Medication Sig Start Date End Date Taking? Authorizing Provider  predniSONE  (DELTASONE ) 50 MG tablet Take 1 tablet (50 mg total) by mouth daily for 5 days. 02/12/24 02/17/24 Yes Willamae Demby S, PA-C  acetaminophen  (TYLENOL ) 325 MG tablet Take 2 tablets (650 mg total) by mouth every 6 (six) hours as needed for moderate pain. 10/26/22   Christopher Savannah, PA-C  benzonatate  (TESSALON ) 100 MG capsule Take 1 capsule (100 mg total) by mouth 2 (two) times daily as needed for cough. 08/31/23   Vicky Charleston, PA-C  cetirizine  (ZYRTEC  ALLERGY) 10 MG tablet Take 1 tablet (10 mg total) by mouth daily. 10/26/22   Christopher Savannah, PA-C  Diclofenac  Sodium 3 % GEL Apply 1 Application topically 2 (two) times daily. 02/04/24   Christopher Savannah, PA-C  doxycycline  (VIBRAMYCIN ) 100 MG capsule Take 1 capsule (100 mg total) by mouth 2 (two) times daily. 12/24/23   Christopher Savannah, PA-C  fluticasone  (FLONASE ) 50 MCG/ACT  nasal spray Place 2 sprays into both nostrils daily. Patient taking differently: Place 2 sprays into both nostrils daily as needed for allergies. 12/15/20   Mortenson, Ashley, MD  mesalamine  (LIALDA ) 1.2 g EC tablet Take 4 tablets (4.8 g total) by mouth daily with breakfast. 01/31/24   McMichael, Nestor M, PA-C  prenatal vitamin w/FE, FA (PRENATAL 1 + 1) 27-1 MG TABS tablet Take 1 tablet by mouth daily at 12 noon. 05/08/23   Tad Arland POUR, CNM  Probiotic Product (PROBIOTIC PO) Take 1 capsule by mouth daily.    [provider]  sertraline (ZOLOFT) 50 MG tablet Take 50 mg by mouth daily.    [provider]  traMADol  (ULTRAM ) 50 MG tablet Take 1 tablet (50 mg total) by mouth every 6 (six) hours as needed for moderate pain (pain score 4-6). 08/23/23   Beather Delon Gibson, PA  albuterol (PROVENTIL HFA;VENTOLIN HFA) 108 (90 BASE) MCG/ACT inhaler Inhale 2 puffs into the lungs every 6 (six) hours as needed. For breathing   10/11/19  [provider]  Fluticasone -Salmeterol (ADVAIR) 250-50 MCG/DOSE AEPB Inhale 1 puff into the lungs every 12 (twelve) hours.    10/11/19  [provider]  ipratropium (ATROVENT ) 0.03 % nasal spray Place 2 sprays into both nostrils 2 (two) times daily as needed for rhinitis. 05/28/20 06/13/20  Arloa Suzen RAMAN, NP  montelukast (SINGULAIR) 10 MG tablet Take 10 mg by mouth at bedtime.  10/11/19  [provider]    Allergies: Patient has no known allergies.    Review of Systems  Gastrointestinal:  Positive for abdominal pain and blood in stool.    Updated Vital Signs BP 118/86   Pulse 78   Temp 98.2 F (36.8 C)   Resp 20   Ht 5' 6 (1.676 m)   Wt 68 kg   LMP 01/18/2024   SpO2 96%   BMI 24.21 kg/m   Physical Exam Vitals and nursing note reviewed.  Constitutional:      General: She is not in acute distress.    Appearance: Normal appearance. She is not ill-appearing or diaphoretic.  HENT:     Head: Normocephalic and  atraumatic.  Eyes:     General: No scleral icterus.    Extraocular Movements: Extraocular movements intact.     Conjunctiva/sclera: Conjunctivae normal.  Cardiovascular:     Rate and Rhythm: Normal rate and regular rhythm.     Pulses: Normal pulses.     Heart sounds: Normal heart sounds. No murmur heard.    No friction rub. No gallop.  Pulmonary:     Effort: Pulmonary effort is normal. No respiratory distress.     Breath sounds: Normal breath sounds. No stridor. No wheezing, rhonchi or rales.  Chest:     Chest wall: No tenderness.  Abdominal:     General: Abdomen is flat. Bowel sounds are normal. There is no distension.     Palpations: Abdomen is soft. There is no pulsatile mass.     Tenderness: There is abdominal tenderness in the right lower quadrant and left lower quadrant. There is no right CVA tenderness, left CVA tenderness, guarding or rebound.     Hernia: No hernia is present.  Musculoskeletal:     Cervical back: Normal range of motion and neck supple. No rigidity.     Right lower leg: No edema.     Left lower leg: No edema.  Skin:    General: Skin is warm and dry.     Coloration: Skin is not jaundiced, mottled or pale.     Findings: No erythema.  Neurological:     General: No focal deficit present.     Mental Status: She is alert and oriented to person, place, and time. Mental status is at baseline.     Cranial Nerves: No cranial nerve deficit.     Motor: No weakness.  Psychiatric:        Mood and Affect: Mood normal. Mood is not anxious.     (all labs ordered are listed, but only abnormal results are displayed) Labs Reviewed  CBC - Abnormal; Notable for the following components:      Result Value   MCV 101.9 (*)    All other components within normal limits  URINALYSIS, ROUTINE W REFLEX MICROSCOPIC - Abnormal; Notable for the following components:   Specific Gravity, Urine 1.032 (*)    Hgb urine dipstick SMALL (*)    All other components within normal limits   LIPASE, BLOOD  COMPREHENSIVE METABOLIC PANEL WITH GFR  PREGNANCY, URINE    EKG: None  Radiology: CT ABDOMEN PELVIS W CONTRAST Result Date: 02/12/2024 CLINICAL DATA:  Nonlocalized abdominal pain. Bloody stool. History of ulcerative colitis. EXAM: CT ABDOMEN AND PELVIS WITH CONTRAST TECHNIQUE: Multidetector CT imaging of the abdomen and pelvis was performed using the standard protocol following bolus administration of intravenous contrast. RADIATION DOSE REDUCTION: This exam was performed according to the departmental dose-optimization program which includes  automated exposure control, adjustment of the mA and/or kV according to patient size and/or use of iterative reconstruction technique. CONTRAST:  OMNIPAQUE  IOHEXOL  300 MG/ML  SOLN COMPARISON:  CT 02/14/2022 and older. FINDINGS: Lower chest: There is some linear opacity lung bases likely scar or atelectasis. No pleural effusion. Hepatobiliary: Gallbladder is present and nondilated. Patent portal vein. There are 3 sub 5 mm low-attenuation foci in the right hepatic lobe such as series 2, image 12, 14 and 19. These are too small to characterize but favor benign cystic foci. No specific imaging follow-up. Pancreas: Unremarkable. No pancreatic ductal dilatation or surrounding inflammatory changes. Spleen: Normal in size without focal abnormality. Adrenals/Urinary Tract: Adrenal glands are unremarkable. Kidneys are normal, without renal calculi, focal lesion, or hydronephrosis. Bladder is unremarkable. Stomach/Bowel: Large bowel has a normal course and caliber with scattered colonic stool. Normal retrocecal appendix in the right hemipelvis. There is slight however wall thickening along the rectum with some heterogeneous enhancement. Please correlate with history of ulcer colitis inflammatory bowel disease. There is scattered debris in the stomach. Small bowel is nondilated. Vascular/Lymphatic: No significant vascular findings are present. No enlarged  abdominal or pelvic lymph nodes. Circumaortic left renal vein. Prominent vein along the rectum. Series 2, image 72. Reproductive: Retroverted uterus.  No separate adnexal mass Other: No free air or fluid collection identified. Musculoskeletal: No acute or significant osseous findings. IMPRESSION: Question slight wall thickening with heterogeneous mid along the low rectum. However this areas underdistended. Appearance could relate to level of distention although with history of ulcer colitis a area of active inflammatory changes possible. Prominent vein along the left anterior wall of the rectum. Possible hemorrhage. Scattered colonic stool. Normal appendix. No obstruction or free air. No free fluid. Electronically Signed   By: Ranell Bring M.D.   On: 02/12/2024 20:57    Procedures   Medications Ordered in the ED  predniSONE  (DELTASONE ) tablet 50 mg (has no administration in time range)  iohexol  (OMNIPAQUE ) 300 MG/ML solution 100 mL (100 mLs Intravenous Contrast Given 02/12/24 2026)                                Medical Decision Making Amount and/or Complexity of Data Reviewed Labs: ordered. Radiology: ordered.  Risk Prescription drug management.   This patient is a 23 year old female who presents to the ED for concern of low abdominal pain and bright red blood in stool with 2-3 bowel movements for the last 2 days, prehistory of ulcerative colitis, feeling similar to her previous ulcerative colitis flareups.  Contacted her GI doctor and was told to come to ED for evaluation due to not having appointment till August 4.  On physical exam, patient is in no acute distress, afebrile, alert and orient x 4, speaking in full sentences, nontachypneic, nontachycardic.  Mild lower abdominal pain to palpation particularly over the right lower quadrant.  Deferred GU exam.  Exam is otherwise unremarkable.  Lab work was unremarkable.  CT scan did show some Noted possible low rectal wall thickening which was  likely area of inflammation with prominent pain along the left anterior wall of rectum with possible hemorrhage.  With patient's current symptoms, will provide prednisone  here and a short course of steroids with instructing her to reach out to her GI doctor to have her prescribe and manage her prednisone  taper/steroid in the outpatient setting.  Low suspicion for any emergent cause of her symptoms today.  Provided  oral prednisone  today to start.  Patient vital signs have remained stable throughout the course of patient's time in the ED. Low suspicion for any other emergent pathology at this time. I believe this patient is safe to be discharged. Provided strict return to ER precautions. Patient expressed agreement and understanding of plan. All questions were answered.  Differential diagnoses prior to evaluation: The emergent differential diagnosis includes, but is not limited to, appendicitis, Diverticulitis, Gastritis, Enteritis, Incarcerated or Strangulated Hernia, Nephrolithiasis , Pyelonephritis, Inflammatory Bowel Disease, Mesenteric Adenitis, Gastroenteritis, Constipation, Endometriosis, Ectopic Pregnancy, Ovarian Torsion, Ruptured Ovarian Cyst, Pelvic Inflammatory Disease (PID), Tubo-Ovarian Abscess, Mittelschmerz this is not an exhaustive differential.   Past Medical History / Co-morbidities / Social History: Ulcerative colitis, asthma  Additional history: Chart reviewed. Pertinent results include:   Noted to have seen GI last 08/14/2023 and was restarted on Lialda .  With considered for colonoscopy at that time.  Scheduled for appointment again in April  Messaged GI today and was told that they do not appointment till 02/25/2024.  Lab Tests/Imaging studies: I personally interpreted labs/imaging and the pertinent results include:    CBC unremarkable CMP unremarkable Lipase unremarkable UA shows elevated specific gravity and small hemoglobin but otherwise unremarkable Pregnancy test  negative .   CT scan showed questionable slight wall thickening with heterogeneous mid along the rectum with prominent vein along the left anterior wall of the rectum with possible hemorrhage. I agree with the radiologist interpretation.    Medications: I ordered medication including prednisone .  I have reviewed the patients home medicines and have made adjustments as needed.  Critical Interventions: None  Social Determinants of Health: Notably is able to have a good follow-up with her GI doctor with appointment scheduled for August 4  Disposition: After consideration of the diagnostic results and the patients response to treatment, I feel that the patient would benefit from discharge and treatment as above.   emergency department workup does not suggest an emergent condition requiring admission or immediate intervention beyond what has been performed at this time. The plan is: Follow-up with GI doctor, prednisone  with GI managing and will have patient reach out to them to manage, return to the ED for new or worsening symptoms. The patient is safe for discharge and has been instructed to return immediately for worsening symptoms, change in symptoms or any other concerns.   Final diagnoses:  Ulcerative colitis with rectal bleeding, unspecified location [facility])    ED Discharge Orders          Ordered    predniSONE  (DELTASONE ) 50 MG tablet  Daily        02/12/24 2132               Thaer Miyoshi S, PA-C 02/12/24 2137    Ula Prentice SAUNDERS, MD 02/12/24 [phone]

## 2024-02-12 NOTE — ED Notes (Signed)
 RN reviewed discharge instructions with pt. Pt verbalized understanding and had no further questions. VSS upon discharge.

## 2024-02-12 NOTE — ED Triage Notes (Signed)
 Arrives POV with complaints of abdominal pain (chronic issue, patient has ulcerative colitis). Patient also started having blood in her stool overnight and she was sent here for further evaluation.

## 2024-02-12 NOTE — Telephone Encounter (Signed)
 Left message on machine to call back

## 2024-02-14 ENCOUNTER — Other Ambulatory Visit: Payer: Self-pay

## 2024-02-14 ENCOUNTER — Telehealth: Payer: Self-pay

## 2024-02-14 ENCOUNTER — Encounter: Payer: Self-pay | Admitting: Gastroenterology

## 2024-02-14 DIAGNOSIS — K51511 Left sided colitis with rectal bleeding: Secondary | ICD-10-CM

## 2024-02-14 NOTE — Telephone Encounter (Signed)
Agree with this plan of action

## 2024-02-14 NOTE — Telephone Encounter (Signed)
 Lacey Perez, If patient can be seen with JZ tomorrow that would be ideal. Recommend she have additional studies performed including a GI pathogen panel/fecal calprotectin. If she could have additional blood work including an ESR/CRP completed. She may use Tylenol  up to 3000 mg a day for now. From a pain perspective, would not prescribe opioid therapy currently.  If she is seen in clinic, short course of tramadol  may be something to consider.   It has been a few years since her endoscopic evaluation, so it sounds like she may need an updated endoscopic evaluation, pending hopefully getting her feeling a little better. Please let me know if she is willing to come in and get those labs and do the stool studies and if she is going to be able to come in for clinic with JZ. Thanks. GM

## 2024-02-14 NOTE — Telephone Encounter (Addendum)
 See the patient message of 02/14/24. Spoke with the patient on the telephone. She is unable to come to an appointment tomorrow. 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 She agrees to come to have labs done. If an opening comes available on an APP schedule Tuesday 02/19/24, she could come that day. Presently there are not any openings. Orders placed for ESR, CRP, fecal calprotectin and GI pathogen panel.

## 2024-02-15 ENCOUNTER — Ambulatory Visit: Admitting: Gastroenterology

## 2024-02-15 ENCOUNTER — Ambulatory Visit: Admitting: Physician Assistant

## 2024-02-28 ENCOUNTER — Other Ambulatory Visit

## 2024-02-28 DIAGNOSIS — K51511 Left sided colitis with rectal bleeding: Secondary | ICD-10-CM

## 2024-02-28 LAB — SEDIMENTATION RATE: Sed Rate: 4 mm/h (ref 0–20)

## 2024-02-29 LAB — HIGH SENSITIVITY CRP: CRP, High Sensitivity: 0.8 mg/L (ref 0.000–5.000)

## 2024-03-02 ENCOUNTER — Ambulatory Visit: Payer: Self-pay | Admitting: Gastroenterology

## 2024-03-09 ENCOUNTER — Encounter (HOSPITAL_BASED_OUTPATIENT_CLINIC_OR_DEPARTMENT_OTHER): Payer: Self-pay | Admitting: Certified Nurse Midwife

## 2024-03-21 ENCOUNTER — Telehealth: Admitting: Physician Assistant

## 2024-03-21 DIAGNOSIS — J069 Acute upper respiratory infection, unspecified: Secondary | ICD-10-CM

## 2024-03-22 MED ORDER — FLUTICASONE PROPIONATE 50 MCG/ACT NA SUSP
2.0000 | Freq: Every day | NASAL | 6 refills | Status: DC
Start: 2024-03-22 — End: 2024-04-02

## 2024-03-22 MED ORDER — BENZONATATE 100 MG PO CAPS: 100.0000 mg | ORAL_CAPSULE | Freq: Two times a day (BID) | ORAL | 0 refills | Status: DC | PRN

## 2024-03-22 NOTE — Progress Notes (Signed)

## 2024-04-02 ENCOUNTER — Encounter: Payer: Self-pay | Admitting: Gastroenterology

## 2024-04-02 ENCOUNTER — Ambulatory Visit: Admitting: Gastroenterology

## 2024-04-02 VITALS — BP 104/64 | HR 67 | Ht 66.0 in | Wt 163.2 lb

## 2024-04-02 DIAGNOSIS — R1030 Lower abdominal pain, unspecified: Secondary | ICD-10-CM | POA: Diagnosis not present

## 2024-04-02 DIAGNOSIS — K51511 Left sided colitis with rectal bleeding: Secondary | ICD-10-CM

## 2024-04-02 DIAGNOSIS — K219 Gastro-esophageal reflux disease without esophagitis: Secondary | ICD-10-CM

## 2024-04-02 DIAGNOSIS — R140 Abdominal distension (gaseous): Secondary | ICD-10-CM

## 2024-04-02 MED ORDER — NA SULFATE-K SULFATE-MG SULF 17.5-3.13-1.6 GM/177ML PO SOLN
1.0000 | ORAL | 0 refills | Status: DC
Start: 2024-04-02 — End: 2024-05-06

## 2024-04-02 NOTE — [place] Notes (Unsigned)
 GASTROENTEROLOGY OUTPATIENT CLINIC VISIT   Primary Care Provider [facility] [address] [place] [address] [phone]  Referring Provider [facility] [address] [place],  [place] [address] [phone]  Patient Profile: Lacey Perez is a 23 y.o. female with a pmh significant for  The patient presents to the [facility] for an evaluation and management of problem(s) noted below:  Problem List No diagnosis found.  History of Present Illness    The patient does/does not take NSAIDs or BC/Goody Powder. Patient has/has not had an EGD. Patient has/has not had a Colonoscopy.  GI Review of Systems Positive as above Negative for  Pyrosis; Reflux; Regurgitation; Dysphagia; Odynophagia; Globus; Post-prandial cough; Nocturnal cough; Nasal regurgitation; Epigastric pain; Nausea; Vomiting; Hematemesis; Jaundice; Change in Appetite; Early satiety; Abdominal pain; Abdominal bloating; Eructation; Flatulence; Change in BM Frequency; Change in BM Consistency; Constipation; Diarrhea; Incontinence; Urgency; Tenesmus; Hematochezia; Melena  Review of Systems General: Denies fevers/chills/weight loss unintentionally Cardiovascular: Denies chest pain Pulmonary: Denies shortness of breath Gastroenterological: See HPI Genitourinary: Denies darkened urine Hematological: Denies easy bruising/bleeding Endocrine: Denies temperature intolerance Dermatological: Denies jaundice Psychological: Mood is stable  Medications Current Outpatient Medications  Medication Sig Dispense Refill   acetaminophen  (TYLENOL ) 325 MG tablet Take 2 tablets (650 mg total) by mouth every 6 (six) hours as needed for moderate pain. 30 tablet 0   cetirizine  (ZYRTEC  ALLERGY) 10 MG tablet Take 1 tablet (10 mg total) by mouth daily. 30 tablet 0   fluticasone  (FLONASE ) 50 MCG/ACT nasal spray Place 2 sprays into both nostrils daily. 16 g 0   mesalamine   (LIALDA ) 1.2 g EC tablet Take 4 tablets (4.8 g total) by mouth daily with breakfast. 120 tablet 2   prenatal vitamin w/FE, FA (PRENATAL 1 + 1) 27-1 MG TABS tablet Take 1 tablet by mouth daily at 12 noon. 100 tablet 5   Probiotic Product (PROBIOTIC PO) Take 1 capsule by mouth daily.     sertraline (ZOLOFT) 50 MG tablet Take 50 mg by mouth daily.     Vitamin D, Ergocalciferol, (DRISDOL) 1.25 MG (50000 UNIT) CAPS capsule Take 50,000 Units by mouth every 7 (seven) days.     traZODone (DESYREL) 25 mg TABS tablet Take 25 mg by mouth at bedtime.     No current facility-administered medications for this visit.    Allergies No Known Allergies  Histories Past Medical History:  Diagnosis Date   Asthma    UC (ulcerative colitis) (HCC)    Vitamin D deficiency    Past Surgical History:  Procedure Laterality Date   HAND SURGERY  01/2023   TONSILLECTOMY AND ADENOIDECTOMY     WISDOM TOOTH EXTRACTION     Social History   Socioeconomic History   Marital status: Single    Spouse name: Not on file   Number of children: 0   Years of education: Not on file   Highest education level: Not on file  Occupational History   Not on file  Tobacco Use   Smoking status: Never   Smokeless tobacco: Never  Vaping Use   Vaping status: Every Day   Substances: Nicotine, Flavoring  Substance and Sexual Activity   Alcohol use: Not Currently   Drug use: Never   Sexual activity: Yes    Birth control/protection: None  Other Topics Concern   Not on file  Social History Narrative   Not on file   Social Drivers of Health   Financial Resource Strain: Not on file  Food  Insecurity: No Food Insecurity (12/07/2021)   Hunger Vital Sign    Worried About Running Out of Food in the Last Year: Never true    Ran Out of Food in the Last Year: Never true  Transportation Needs: No Transportation Needs (08/07/2022)   PRAPARE - Administrator, Civil Service (Medical): No    Lack of Transportation  (Non-Medical): No  Physical Activity: Not on file  Stress: Stress Concern Present (09/06/2022)   [employer] of Occupational Health - Occupational Stress Questionnaire    Feeling of Stress : Rather much  Social Connections: Not on file  Intimate Partner Violence: Not on file   Family History  Problem Relation Age of Onset   Diabetes Mother    Hypertension Mother    Asthma Mother    Fibroids Mother    Asthma Father    Asthma Sister    Colon polyps Maternal Grandmother    Breast cancer Paternal Grandmother    Diabetes Paternal Grandmother    Colon cancer Neg Hx    Esophageal cancer Neg Hx    Rectal cancer Neg Hx    Stomach cancer Neg Hx    Pancreatic cancer Neg Hx    I have reviewed her medical, social, and family history in detail and updated the electronic medical record as necessary.    PHYSICAL EXAMINATION  BP 104/64   Pulse 67   Ht 5' 6 (1.676 m)   Wt 163 lb 4 oz (74 kg)   SpO2 99%   BMI 26.35 kg/m  Wt Readings from Last 3 Encounters:  04/02/24 163 lb 4 oz (74 kg)  02/12/24 150 lb (68 kg)  08/24/23 150 lb 4 oz (68.2 kg)   GEN: NAD, appears stated age, doesn't appear chronically ill PSYCH: Cooperative, without pressured speech EYE: Conjunctivae pink, sclerae anicteric ENT: MMM CV: Nontachycardic RESP: No audible wheezing GI: NABS, soft, NT/ND, without rebound or guarding, no HSM appreciated GU: DRE shows MSK/EXT: No significant lower extremity edema SKIN: No jaundice, no spider angiomata NEURO:  Alert & Oriented x 3, no focal deficits, no evidence of asterixis   REVIEW OF DATA  I reviewed the following data at the time of this encounter:  GI Procedures and Studies  ***  Laboratory Studies  ***  Imaging Studies  ***   ASSESSMENT  Lacey Perez is a 23 y.o. female.  The patient is seen today for evaluation and management of:  No diagnosis found.  ***   PLAN  There are no diagnoses linked to this encounter.   No orders of the defined  types were placed in this encounter.   New Prescriptions   No medications on file   Modified Medications   No medications on file    Planned Follow Up No follow-ups on file.   Total Time in Face-to-Face and in Coordination of Care for patient including independent/personal interpretation/review of prior testing, medical history, examination, medication adjustment, communicating results with the patient directly, and documentation within the EHR is ***.   Aloha Finner, MD [facility] Office # [phone]

## 2024-04-02 NOTE — Patient Instructions (Signed)
 We have sent the following medications to your pharmacy for you to pick up at your convenience: Suprep  Your provider has requested that you go to the basement level for lab work before leaving today. Press B on the elevator. The lab is located at the first door on the left as you exit the elevator.  You have been scheduled for a colonoscopy. Please follow written instructions given to you at your visit today.   If you use inhalers (even only as needed), please bring them with you on the day of your procedure.  DO NOT TAKE 7 DAYS PRIOR TO TEST- Trulicity (dulaglutide) Ozempic, Wegovy (semaglutide) Mounjaro (tirzepatide) Bydureon Bcise (exanatide extended release)  DO NOT TAKE 1 DAY PRIOR TO YOUR TEST Rybelsus (semaglutide) Adlyxin (lixisenatide) Victoza (liraglutide) Byetta (exanatide) ___________________________________________________________________________   Due to recent changes in healthcare laws, you may see the results of your imaging and laboratory studies on MyChart before your provider has had a chance to review them.  We understand that in some cases there may be results that are confusing or concerning to you. Not all laboratory results come back in the same time frame and the provider may be waiting for multiple results in order to interpret others.  Please give us  48 hours in order for your provider to thoroughly review all the results before contacting the office for clarification of your results.   _______________________________________________________  If your blood pressure at your visit was 140/90 or greater, please contact your primary care physician to follow up on this.  _______________________________________________________  If you are age 87 or older, your body mass index should be between 23-30. Your Body mass index is 26.35 kg/m. If this is out of the aforementioned range listed, please consider follow up with your Primary Care Provider.  If you are  age 74 or younger, your body mass index should be between 19-25. Your Body mass index is 26.35 kg/m. If this is out of the aformentioned range listed, please consider follow up with your Primary Care Provider.   ________________________________________________________  The [facility] providers would like to encourage you to use MYCHART to communicate with providers for non-urgent requests or questions.  Due to long hold times on the telephone, sending your provider a message by [facility] may be a faster and more efficient way to get a response.  Please allow 48 business hours for a response.  Please remember that this is for non-urgent requests.  _______________________________________________________  [facility] is using a team-based approach to care.  Your team is made up of your doctor and two to three APPS. Our APPS (Nurse Practitioners and Physician Assistants) work with your physician to ensure care continuity for you. They are fully qualified to address your health concerns and develop a treatment plan. They communicate directly with your gastroenterologist to care for you. Seeing the Advanced Practice Practitioners on your physician's team can help you by facilitating care more promptly, often allowing for earlier appointments, access to diagnostic testing, procedures, and other specialty referrals.   Thank you for choosing me and [facility].  Dr. Wilhelmenia

## 2024-04-03 ENCOUNTER — Encounter: Payer: Self-pay | Admitting: Gastroenterology

## 2024-04-04 ENCOUNTER — Other Ambulatory Visit

## 2024-04-04 DIAGNOSIS — R140 Abdominal distension (gaseous): Secondary | ICD-10-CM

## 2024-04-04 DIAGNOSIS — K51511 Left sided colitis with rectal bleeding: Secondary | ICD-10-CM | POA: Diagnosis not present

## 2024-04-04 DIAGNOSIS — K219 Gastro-esophageal reflux disease without esophagitis: Secondary | ICD-10-CM | POA: Diagnosis not present

## 2024-04-04 DIAGNOSIS — R1030 Lower abdominal pain, unspecified: Secondary | ICD-10-CM

## 2024-04-05 ENCOUNTER — Encounter: Payer: Self-pay | Admitting: Gastroenterology

## 2024-04-05 LAB — GI PROFILE, STOOL, PCR

## 2024-04-07 ENCOUNTER — Other Ambulatory Visit: Payer: Self-pay

## 2024-04-07 ENCOUNTER — Telehealth: Payer: Self-pay | Admitting: Gastroenterology

## 2024-04-07 MED ORDER — MESALAMINE 1000 MG RE SUPP: 1000.0000 mg | Freq: Every day | RECTAL | Status: DC

## 2024-04-07 NOTE — Telephone Encounter (Signed)
 Inbound call from patient stating she would like to know if theres anything she can take since she's been experiencing diarrhea and has been going almost 10 times a day since last office visit. Requesting a call back  Please advise  Thank you

## 2024-04-07 NOTE — Telephone Encounter (Signed)
Prescription has been sent to the pharmacy pt aware.

## 2024-04-07 NOTE — Telephone Encounter (Signed)
 See separate note. I told Patty okay to send in Canasa  suppositories. She may use Imodium up to 10 mg daily if needed as well but I will try to initiate with Canasa  suppositories nightly first. Thanks. GM

## 2024-04-07 NOTE — Telephone Encounter (Signed)
 Lets go ahead and do Canasa  suppositories. 1 per rectum nightly. 30/2. Thanks. GM

## 2024-04-07 NOTE — Telephone Encounter (Signed)
 The pt states that she was asked about using Canasa  supp at her last office visit.  She stated at the time that she did not want to try then but now would like a prescription due to her multiple episodes of diarrhea per day.  Please advise

## 2024-04-12 LAB — CALPROTECTIN: Calprotectin: 3550 ug/g — ABNORMAL HIGH

## 2024-04-18 ENCOUNTER — Ambulatory Visit: Admitting: Pharmacist

## 2024-04-18 ENCOUNTER — Other Ambulatory Visit: Payer: Self-pay

## 2024-04-18 ENCOUNTER — Other Ambulatory Visit (HOSPITAL_COMMUNITY): Payer: Self-pay

## 2024-04-18 DIAGNOSIS — Z206 Contact with and (suspected) exposure to human immunodeficiency virus [HIV]: Secondary | ICD-10-CM

## 2024-04-18 DIAGNOSIS — W460XXA Contact with hypodermic needle, initial encounter: Secondary | ICD-10-CM

## 2024-04-18 MED ORDER — BICTEGRAVIR-EMTRICITAB-TENOFOV 50-200-25 MG PO TABS: 1.0000 | ORAL_TABLET | Freq: Every day | ORAL | 0 refills | Status: DC

## 2024-04-18 NOTE — [place] Notes (Signed)
 NEW REFERRAL TO CPP CLINIC      HPI: Lacey Perez is a 23 y.o. female who presents to the [facility] pharmacy clinic for HIV PEP discussion and initiation.  Referring ID Physician: Dr. Overton  Patient Active Problem List   Diagnosis Date Noted   Routine screening for STI (sexually transmitted infection) 05/08/2023   PCOS (polycystic ovarian syndrome) 05/08/2023   Encounter for annual routine gynecological examination 05/08/2023   Anxiety 01/26/2023   Crohn's disease (HCC) 01/26/2023    Patient's Medications  New Prescriptions   No medications on file  Previous Medications   ACETAMINOPHEN  (TYLENOL ) 325 MG TABLET    Take 2 tablets (650 mg total) by mouth every 6 (six) hours as needed for moderate pain.   CETIRIZINE  (ZYRTEC  ALLERGY) 10 MG TABLET    Take 1 tablet (10 mg total) by mouth daily.   FLUTICASONE  (FLONASE ) 50 MCG/ACT NASAL SPRAY    Place 2 sprays into both nostrils daily.   MESALAMINE  (CANASA ) 1000 MG SUPPOSITORY    Place 1 suppository (1,000 mg total) rectally at bedtime.   MESALAMINE  (LIALDA ) 1.2 G EC TABLET    Take 4 tablets (4.8 g total) by mouth daily with breakfast.   NA SULFATE-K SULFATE-MG SULFATE CONCENTRATE (SUPREP BOWEL PREP KIT) 17.5-3.13-1.6 GM/177ML SOLN    Take 1 kit (354 mLs total) by mouth as directed. For colonoscopy prep   PRENATAL VITAMIN W/FE, FA (PRENATAL 1 + 1) 27-1 MG TABS TABLET    Take 1 tablet by mouth daily at 12 noon.   PROBIOTIC PRODUCT (PROBIOTIC PO)    Take 1 capsule by mouth daily.   SERTRALINE (ZOLOFT) 50 MG TABLET    Take 50 mg by mouth daily.   TRAZODONE (DESYREL) 25 MG TABS TABLET    Take 25 mg by mouth at bedtime.   VITAMIN D, ERGOCALCIFEROL, (DRISDOL) 1.25 MG (50000 UNIT) CAPS CAPSULE    Take 50,000 Units by mouth every 7 (seven) days.  Modified Medications   No medications on file  Discontinued Medications   No medications on file    Allergies: No Known Allergies  Past Medical History: Past Medical History:  Diagnosis Date   Asthma     UC (ulcerative colitis) (HCC)    Vitamin D deficiency     Social History: Social History   Socioeconomic History   Marital status: Single    Spouse name: Not on file   Number of children: 0   Years of education: Not on file   Highest education level: Not on file  Occupational History   Not on file  Tobacco Use   Smoking status: Never   Smokeless tobacco: Never  Vaping Use   Vaping status: Every Day   Substances: Nicotine, Flavoring  Substance and Sexual Activity   Alcohol use: Not Currently   Drug use: Never   Sexual activity: Yes    Birth control/protection: None  Other Topics Concern   Not on file  Social History Narrative   Not on file   Social Drivers of Health   Financial Resource Strain: Not on file  Food Insecurity: No Food Insecurity (12/07/2021)   Hunger Vital Sign    Worried About Running Out of Food in the Last Year: Never true    Ran Out of Food in the Last Year: Never true  Transportation Needs: No Transportation Needs (08/07/2022)   PRAPARE - Administrator, Civil Service (Medical): No    Lack of Transportation (Non-Medical): No  Physical Activity: Not  on file  Stress: Stress Concern Present (09/06/2022)   [employer] of Occupational Health - Occupational Stress Questionnaire    Feeling of Stress : Rather much  Social Connections: Not on file    Labs: Lab Results  Component Value Date   HIV1RNAQUANT Not Detected 05/11/2023    RPR and STI Lab Results  Component Value Date   LABRPR Non Reactive 05/08/2023   LABRPR Non Reactive 01/12/2023   LABRPR Non Reactive 03/24/2022    STI Results GC CT  05/08/2023 11:35 AM Negative  Negative   01/12/2023  9:11 AM Negative  Negative   07/05/2022  3:19 PM Negative  Negative   03/24/2022  9:56 AM Negative  Negative     Hepatitis B Lab Results  Component Value Date   HEPBSAG Negative 05/08/2023   Hepatitis C No results found for: HEPCAB, HCVRNAPCRQN Hepatitis A No  results found for: HAV Lipids: No results found for: CHOL, TRIG, HDL, CHOLHDL, VLDL, LDLCALC  Assessment: Lacey Perez presents to clinic today for PEP initiation after accidental needle stick during clinicals. States that needle stuck her after practicing catheter insertion on a clinical patient yesterday. That patient did not have any available HIV or hepatitis labs. Given lack of information on this patient, agree to start PEP for occupational exposure. Biktarvy should be covered no charge through [employer]. Sent script to [facility] in [place]. Provided patient with 7-day sample in case of pharmacy delay. Will check baseline HIV and hepatitis testing today though this was also assessed and negative in October last year. Will follow up with me in 4 weeks for repeat testing.  Explained that Lacey Perez is a one pill once daily medication with or without food and the importance of not missing any doses. Explained resistance and how it develops and why it is so important to take Biktarvy daily and not skip days or doses. Counseled patient to take it around the same time each day. Counseled on what to do if dose is missed, if closer to missed dose take immediately, if closer to next dose then skip and resume normal schedule. Cautioned on possible side effects the first week or so including nausea, diarrhea, dizziness, and headaches but that they should resolve after the first couple of weeks. I reviewed patient medications and found one drug interaction with taking iron in prenatal vitamins. Counseled patient to separate Biktarvy from divalent cations including multivitamins. She plans to hold her prenatal vitamins while taking Biktarvy this month. Discussed with patient to call clinic if she starts a new medication or herbal supplement. I gave the patient my card and told her to call me with any issues/questions/concerns.    Plan: - Start Biktarvy x 30 days - Check HIV antibody and hepatitis A/B/C  serologies - Follow up with me on 05/22/24  Lacey Perez, PharmD, CPP, BCIDP, AAHIVP Clinical Pharmacist Practitioner Infectious Diseases Clinical Pharmacist [facility] 04/18/2024, 9:13 AM

## 2024-04-19 LAB — HIV ANTIBODY (ROUTINE TESTING W REFLEX)
HIV 1&2 Ab, 4th Generation: NONREACTIVE
HIV FINAL INTERPRETATION: NEGATIVE

## 2024-04-19 LAB — HEPATITIS B CORE ANTIBODY, TOTAL: Hep B Core Total Ab: NONREACTIVE

## 2024-04-19 LAB — HEPATITIS B SURFACE ANTIGEN: Hepatitis B Surface Ag: NONREACTIVE

## 2024-04-19 LAB — HEPATITIS C ANTIBODY: Hepatitis C Ab: NONREACTIVE

## 2024-04-19 LAB — HEPATITIS A ANTIBODY, TOTAL: Hepatitis A AB,Total: REACTIVE — AB

## 2024-04-19 LAB — HEPATITIS B SURFACE ANTIBODY,QUALITATIVE: Hep B S Ab: REACTIVE — AB

## 2024-04-21 ENCOUNTER — Ambulatory Visit: Payer: Self-pay | Admitting: Pharmacist

## 2024-04-21 ENCOUNTER — Other Ambulatory Visit: Payer: Self-pay | Admitting: Pharmacist

## 2024-04-21 DIAGNOSIS — Z206 Contact with and (suspected) exposure to human immunodeficiency virus [HIV]: Secondary | ICD-10-CM

## 2024-04-21 MED ORDER — BIKTARVY 50-200-25 MG PO TABS: 1.0000 | ORAL_TABLET | Freq: Every day | ORAL | Status: AC

## 2024-04-21 NOTE — [place] Notes (Signed)
 Medication Samples have been provided to the patient.  Drug name: Biktarvy        Strength: 50/200/25 mg       Qty: 1 bottle (7 tablets)   LOT: [id number]   Exp.Date: 62027  Samples requested by Alan Geralds, PharmD.  Dosing instructions: Take one tablet by mouth once daily  The patient has been instructed regarding the correct time, dose, and frequency of taking this medication, including desired effects and most common side effects.   Fareeda Downard L. Adreona Brand, PharmD, BCIDP, AAHIVP, CPP Clinical Pharmacist Practitioner Infectious Diseases Clinical Pharmacist [facility]

## 2024-04-21 NOTE — Telephone Encounter (Signed)
 Lacey Perez requesting a call or MyChart message from [place] to explain her test results. Pt can be reached at [phone].

## 2024-05-06 ENCOUNTER — Encounter: Payer: Self-pay | Admitting: Gastroenterology

## 2024-05-06 ENCOUNTER — Ambulatory Visit (AMBULATORY_SURGERY_CENTER): Admitting: Gastroenterology

## 2024-05-06 VITALS — BP 120/74 | HR 66 | Temp 98.4°F | Resp 18 | Ht 66.0 in | Wt 163.0 lb

## 2024-05-06 DIAGNOSIS — K641 Second degree hemorrhoids: Secondary | ICD-10-CM | POA: Diagnosis not present

## 2024-05-06 DIAGNOSIS — K5150 Left sided colitis without complications: Secondary | ICD-10-CM | POA: Diagnosis not present

## 2024-05-06 DIAGNOSIS — K51511 Left sided colitis with rectal bleeding: Secondary | ICD-10-CM | POA: Diagnosis not present

## 2024-05-06 DIAGNOSIS — Z3202 Encounter for pregnancy test, result negative: Secondary | ICD-10-CM

## 2024-05-06 DIAGNOSIS — K5120 Ulcerative (chronic) proctitis without complications: Secondary | ICD-10-CM | POA: Diagnosis not present

## 2024-05-06 DIAGNOSIS — K644 Residual hemorrhoidal skin tags: Secondary | ICD-10-CM | POA: Diagnosis not present

## 2024-05-06 DIAGNOSIS — K219 Gastro-esophageal reflux disease without esophagitis: Secondary | ICD-10-CM

## 2024-05-06 DIAGNOSIS — K6289 Other specified diseases of anus and rectum: Secondary | ICD-10-CM | POA: Diagnosis not present

## 2024-05-06 DIAGNOSIS — K51919 Ulcerative colitis, unspecified with unspecified complications: Secondary | ICD-10-CM

## 2024-05-06 DIAGNOSIS — R140 Abdominal distension (gaseous): Secondary | ICD-10-CM

## 2024-05-06 DIAGNOSIS — K5100 Ulcerative (chronic) pancolitis without complications: Secondary | ICD-10-CM

## 2024-05-06 LAB — POCT URINE PREGNANCY: Preg Test, Ur: NEGATIVE

## 2024-05-06 MED ORDER — SODIUM CHLORIDE 0.9 % IV SOLN: 500.0000 mL | Freq: Once | INTRAVENOUS | Status: DC

## 2024-05-06 MED ORDER — MESALAMINE 1000 MG RE SUPP: 1000.0000 mg | Freq: Every day | RECTAL | 6 refills | Status: AC

## 2024-05-06 MED ORDER — ONDANSETRON HCL 4 MG PO TABS: 4.0000 mg | ORAL_TABLET | Freq: Three times a day (TID) | ORAL | 2 refills | Status: AC | PRN

## 2024-05-06 MED ORDER — SODIUM CHLORIDE 0.9 % IV SOLN
4.0000 mg | Freq: Once | INTRAVENOUS | Status: AC
  Administered 2024-05-06: 4 mg via INTRAVENOUS

## 2024-05-06 NOTE — [place] Notes (Signed)
 To pacu, VSS. Report to [email or web address]

## 2024-05-06 NOTE — Op Note (Signed)
 [facility] Patient Name: Lacey Perez Procedure Date: 05/06/2024 3:13 PM MRN: [id number] Endoscopist: Aloha Finner , MD, [id number] Age: 23 Referring MD:  Date of Birth: [birth year] Gender: Female Account #: [id number] Procedure:                Colonoscopy Indications:              Left-sided chronic ulcerative colitis, Disease                            activity assessment of left-sided chronic                            ulcerative colitis, Assess therapeutic response to                            therapy of left-sided chronic ulcerative colitis Medicines:                Monitored Anesthesia Care Procedure:                Pre-Anesthesia Assessment:                           - Prior to the procedure, a History and Physical                            was performed, and patient medications and                            allergies were reviewed. The patient's tolerance of                            previous anesthesia was also reviewed. The risks                            and benefits of the procedure and the sedation                            options and risks were discussed with the patient.                            All questions were answered, and informed consent                            was obtained. Prior Anticoagulants: The patient has                            taken no anticoagulant or antiplatelet agents. ASA                            Grade Assessment: II - A patient with mild systemic                            disease. After reviewing the risks and benefits,  the patient was deemed in satisfactory condition to                            undergo the procedure.                           After obtaining informed consent, the colonoscope                            was passed under direct vision. Throughout the                            procedure, the patient's blood pressure, pulse, and                            oxygen  saturations were monitored continuously. The                            CF [id number] #[id number] was introduced through the anus                            and advanced to the the cecum, identified by                            appendiceal orifice and ileocecal valve. The                            colonoscopy was performed without difficulty. The                            patient tolerated the procedure. The quality of the                            bowel preparation was fair. The ileocecal valve,                            appendiceal orifice, and rectum were photographed. Scope In: 3:28:42 PM Scope Out: 3:45:46 PM Scope Withdrawal Time: 0 hours 10 minutes 37 seconds  Total Procedure Duration: 0 hours 17 minutes 4 seconds  Findings:                 Skin tags were found on perianal exam.                           The digital rectal exam findings include                            hemorrhoids. Pertinent negatives include no                            palpable rectal lesions.                           Extensive amounts of semi-liquid stool was found in  the entire colon, interfering with visualization.                            Lavage of the area was performed using copious                            amounts, resulting in clearance with fair                            visualization.                           Normal mucosa was found in the recto-sigmoid colon,                            in the sigmoid colon, in the descending colon, at                            the splenic flexure, in the transverse colon, at                            the hepatic flexure, in the ascending colon and in                            the cecum.                           A diffuse area of granular mucosa was found in the                            mid rectum and in the distal rectum.                           Non-bleeding non-thrombosed internal hemorrhoids                            were  found during retroflexion, during perianal                            exam and during digital exam. The hemorrhoids were                            Grade II (internal hemorrhoids that prolapse but                            reduce spontaneously).                           Biopsies were taken with a cold forceps in the                            transverse colon, at the hepatic flexure, in the  ascending colon and in the cecum for histology.                            Biopsies were taken with a cold forceps in the                            sigmoid colon, in the descending colon and at the                            splenic flexure for histology. Biopsies were taken                            with a cold forceps in the rectum for histology. Complications:            No immediate complications. Estimated Blood Loss:     Estimated blood loss was minimal. Impression:               - Perianal skin tags found on perianal exam.                            Hemorrhoids found on digital rectal exam.                           - Preparation of the colon was fair even after                            extensive lavage.                           - Normal mucosa in the recto-sigmoid colon, in the                            sigmoid colon, in the descending colon, at the                            splenic flexure, in the transverse colon, at the                            hepatic flexure, in the ascending colon and in the                            cecum.                           - Granularity in the mid rectum and in the distal                            rectum.                           - Non-bleeding non-thrombosed internal hemorrhoids.                           - Biopsies were taken with a cold forceps for  histology in the transverse colon, at the hepatic                            flexure, in the ascending colon and in the cecum.                            - Biopsies were taken with a cold forceps for                            histology in the sigmoid colon, in the descending                            colon and at the splenic flexure.                           - Biopsies were taken with a cold forceps for                            histology in the rectum. Recommendation:           - The patient will be observed post-procedure,                            until all discharge criteria are met.                           - Discharge patient to home.                           - Patient has a contact number available for                            emergencies. The signs and symptoms of potential                            delayed complications were discussed with the                            patient. Return to normal activities tomorrow.                            Written discharge instructions were provided to the                            patient.                           - High fiber diet.                           - Use FiberCon 1-2 tablets PO daily.                           - Continue present medications.                           -  Await pathology results.                           - Repeat colonoscopy at appointment to be                            determined because the bowel preparation was                            suboptimal and for surveillance of underlying IBD                            history.                           - The findings and recommendations were discussed                            with the patient.                           - The findings and recommendations were discussed                            with the patient's family. Aloha Finner, MD 05/06/2024 3:56:57 PM

## 2024-05-06 NOTE — Patient Instructions (Signed)
 Follow a high fiber diet. Take 1-2 fibercon tablets daily by mouth. Awaiting pathology results.  YOU HAD AN ENDOSCOPIC PROCEDURE TODAY AT THE [facility]:   Refer to the procedure report that was given to you for any specific questions about what was found during the examination.  If the procedure report does not answer your questions, please call your gastroenterologist to clarify.  If you requested that your care partner not be given the details of your procedure findings, then the procedure report has been included in a sealed envelope for you to review at your convenience later.  YOU SHOULD EXPECT: Some feelings of bloating in the abdomen. Passage of more gas than usual.  Walking can help get rid of the air that was put into your GI tract during the procedure and reduce the bloating. If you had a lower endoscopy (such as a colonoscopy or flexible sigmoidoscopy) you may notice spotting of blood in your stool or on the toilet paper. If you underwent a bowel prep for your procedure, you may not have a normal bowel movement for a few days.  Please Note:  You might notice some irritation and congestion in your nose or some drainage.  This is from the oxygen used during your procedure.  There is no need for concern and it should clear up in a day or so.  SYMPTOMS TO REPORT IMMEDIATELY:  Following lower endoscopy (colonoscopy or flexible sigmoidoscopy):  Excessive amounts of blood in the stool  Significant tenderness or worsening of abdominal pains  Swelling of the abdomen that is new, acute  Fever of 100F or higher  For urgent or emergent issues, a gastroenterologist can be reached at any hour by calling [phone]. Do not use MyChart messaging for urgent concerns.    DIET:  We do recommend a small meal at first, but then you may proceed to your regular diet.  Drink plenty of fluids but you should avoid alcoholic beverages for 24 hours.  ACTIVITY:  You should plan to take it  easy for the rest of today and you should NOT DRIVE or use heavy machinery until tomorrow (because of the sedation medicines used during the test).    FOLLOW UP: Our staff will call the number listed on your records the next business day following your procedure.  We will call around 7:15- 8:00 am to check on you and address any questions or concerns that you may have regarding the information given to you following your procedure. If we do not reach you, we will leave a message.     If any biopsies were taken you will be contacted by phone or by letter within the next 1-3 weeks.  Please call us  at [phone] if you have not heard about the biopsies in 3 weeks.    SIGNATURES/CONFIDENTIALITY: You and/or your care partner have signed paperwork which will be entered into your electronic medical record.  These signatures attest to the fact that that the information above on your After Visit Summary has been reviewed and is understood.  Full responsibility of the confidentiality of this discharge information lies with you and/or your care-partner.

## 2024-05-06 NOTE — Progress Notes (Signed)
 GASTROENTEROLOGY PROCEDURE H&P NOTE   Primary Care Physician: [facility]  HPI: Lacey Perez is a 23 y.o. female who presents for ulcerative left-sided colitis surveillance.  Past Medical History:  Diagnosis Date   Asthma    PCOS (polycystic ovarian syndrome) 2025   UC (ulcerative colitis) (HCC)    Vitamin D deficiency    Past Surgical History:  Procedure Laterality Date   HAND SURGERY  01/2023   TONSILLECTOMY AND ADENOIDECTOMY     WISDOM TOOTH EXTRACTION     Current Outpatient Medications  Medication Sig Dispense Refill   acetaminophen  (TYLENOL ) 325 MG tablet Take 2 tablets (650 mg total) by mouth every 6 (six) hours as needed for moderate pain. 30 tablet 0   bictegravir-emtricitabine-tenofovir AF (BIKTARVY) 50-200-25 MG TABS tablet Take 1 tablet by mouth daily. 30 tablet 0   cetirizine  (ZYRTEC  ALLERGY) 10 MG tablet Take 1 tablet (10 mg total) by mouth daily. 30 tablet 0   fluticasone  (FLONASE ) 50 MCG/ACT nasal spray Place 2 sprays into both nostrils daily. 16 g 0   mesalamine  (CANASA ) 1000 MG suppository Place 1 suppository (1,000 mg total) rectally at bedtime. 30 suppository 02   mesalamine  (LIALDA ) 1.2 g EC tablet Take 4 tablets (4.8 g total) by mouth daily with breakfast. 120 tablet 2   Na Sulfate-K Sulfate-Mg Sulfate concentrate (SUPREP BOWEL PREP KIT) 17.5-3.13-1.6 GM/177ML SOLN Take 1 kit (354 mLs total) by mouth as directed. For colonoscopy prep 354 mL 0   prenatal vitamin w/FE, FA (PRENATAL 1 + 1) 27-1 MG TABS tablet Take 1 tablet by mouth daily at 12 noon. 100 tablet 5   Probiotic Product (PROBIOTIC PO) Take 1 capsule by mouth daily.     sertraline (ZOLOFT) 50 MG tablet Take 50 mg by mouth daily.     traZODone (DESYREL) 25 mg TABS tablet Take 25 mg by mouth at bedtime.     Vitamin D, Ergocalciferol, (DRISDOL) 1.25 MG (50000 UNIT) CAPS capsule Take 50,000 Units by mouth every 7 (seven) days.     Current Facility-Administered Medications  Medication  Dose Route Frequency Provider Last Rate Last Admin   0.9 %  sodium chloride  infusion  500 mL Intravenous Once [name], Alias Villagran Jr., MD        Current Outpatient Medications:    acetaminophen  (TYLENOL ) 325 MG tablet, Take 2 tablets (650 mg total) by mouth every 6 (six) hours as needed for moderate pain., Disp: 30 tablet, Rfl: 0   bictegravir-emtricitabine-tenofovir AF (BIKTARVY) 50-200-25 MG TABS tablet, Take 1 tablet by mouth daily., Disp: 30 tablet, Rfl: 0   cetirizine  (ZYRTEC  ALLERGY) 10 MG tablet, Take 1 tablet (10 mg total) by mouth daily., Disp: 30 tablet, Rfl: 0   fluticasone  (FLONASE ) 50 MCG/ACT nasal spray, Place 2 sprays into both nostrils daily., Disp: 16 g, Rfl: 0   mesalamine  (CANASA ) 1000 MG suppository, Place 1 suppository (1,000 mg total) rectally at bedtime., Disp: 30 suppository, Rfl: 02   mesalamine  (LIALDA ) 1.2 g EC tablet, Take 4 tablets (4.8 g total) by mouth daily with breakfast., Disp: 120 tablet, Rfl: 2   Na Sulfate-K Sulfate-Mg Sulfate concentrate (SUPREP BOWEL PREP KIT) 17.5-3.13-1.6 GM/177ML SOLN, Take 1 kit (354 mLs total) by mouth as directed. For colonoscopy prep, Disp: 354 mL, Rfl: 0   prenatal vitamin w/FE, FA (PRENATAL 1 + 1) 27-1 MG TABS tablet, Take 1 tablet by mouth daily at 12 noon., Disp: 100 tablet, Rfl: 5   Probiotic Product (PROBIOTIC PO), Take 1 capsule by mouth daily., Disp: ,  Rfl:    sertraline (ZOLOFT) 50 MG tablet, Take 50 mg by mouth daily., Disp: , Rfl:    traZODone (DESYREL) 25 mg TABS tablet, Take 25 mg by mouth at bedtime., Disp: , Rfl:    Vitamin D, Ergocalciferol, (DRISDOL) 1.25 MG (50000 UNIT) CAPS capsule, Take 50,000 Units by mouth every 7 (seven) days., Disp: , Rfl:   Current Facility-Administered Medications:    0.9 %  sodium chloride  infusion, 500 mL, Intravenous, Once, [name], Aloha Raddle., MD No Known Allergies Family History  Problem Relation Age of Onset   Diabetes Mother    Hypertension Mother    Asthma Mother    Fibroids  Mother    Asthma Father    Asthma Sister    Colon polyps Maternal Grandmother    Breast cancer Paternal Grandmother    Diabetes Paternal Grandmother    Colon cancer Neg Hx    Esophageal cancer Neg Hx    Rectal cancer Neg Hx    Stomach cancer Neg Hx    Pancreatic cancer Neg Hx    Inflammatory bowel disease Neg Hx    Liver disease Neg Hx    Social History   Socioeconomic History   Marital status: Single    Spouse name: Not on file   Number of children: 0   Years of education: Not on file   Highest education level: Not on file  Occupational History   Not on file  Tobacco Use   Smoking status: Never   Smokeless tobacco: Never  Vaping Use   Vaping status: Every Day   Substances: Nicotine, Flavoring  Substance and Sexual Activity   Alcohol use: Not Currently   Drug use: Never   Sexual activity: Yes    Birth control/protection: None  Other Topics Concern   Not on file  Social History Narrative   Not on file   Social Drivers of Health   Financial Resource Strain: Not on file  Food Insecurity: No Food Insecurity (12/07/2021)   Hunger Vital Sign    Worried About Running Out of Food in the Last Year: Never true    Ran Out of Food in the Last Year: Never true  Transportation Needs: No Transportation Needs (08/07/2022)   PRAPARE - Administrator, Civil Service (Medical): No    Lack of Transportation (Non-Medical): No  Physical Activity: Not on file  Stress: Stress Concern Present (09/06/2022)   [employer] of Occupational Health - Occupational Stress Questionnaire    Feeling of Stress : Rather much  Social Connections: Not on file  Intimate Partner Violence: Not on file    Physical Exam: Today's Vitals   05/06/24 1458  BP: (!) 142/103  Pulse: 70  Temp: 98.4 F (36.9 C)  TempSrc: Skin  SpO2: 100%  Weight: 163 lb (73.9 kg)  Height: 5' 6 (1.676 m)   Body mass index is 26.31 kg/m. GEN: NAD EYE: Sclerae anicteric ENT: MMM CV:  Non-tachycardic GI: Soft, NT/ND NEURO:  Alert & Oriented x 3  Lab Results: No results for input(s): WBC, HGB, HCT, PLT in the last 72 hours. BMET No results for input(s): NA, K, CL, CO2, GLUCOSE, BUN, CREATININE, CALCIUM in the last 72 hours. LFT No results for input(s): PROT, ALBUMIN, AST, ALT, ALKPHOS, BILITOT, BILIDIR, IBILI in the last 72 hours. PT/INR No results for input(s): LABPROT, INR in the last 72 hours.   Impression / Plan: This is a 23 y.o.female who presents for ulcerative left-sided colitis surveillance.  The risks  and benefits of endoscopic evaluation/treatment were discussed with the patient and/or family; these include but are not limited to the risk of perforation, infection, bleeding, missed lesions, lack of diagnosis, severe illness requiring hospitalization, as well as anesthesia and sedation related illnesses.  The patient's history has been reviewed, patient examined, no change in status, and deemed stable for procedure.  The patient and/or family is agreeable to proceed.    Aloha Finner, MD  Gastroenterology Advanced Endoscopy Office # [phone]

## 2024-05-06 NOTE — Progress Notes (Signed)
 Called to room to assist during endoscopic procedure.  Patient ID and intended procedure confirmed with present staff. Received instructions for my participation in the procedure from the performing physician.

## 2024-05-07 ENCOUNTER — Telehealth: Payer: Self-pay

## 2024-05-07 NOTE — Telephone Encounter (Signed)
 No answer on follow up call - voice mail message left

## 2024-05-09 LAB — SURGICAL PATHOLOGY

## 2024-05-10 ENCOUNTER — Ambulatory Visit: Payer: Self-pay | Admitting: Gastroenterology

## 2024-05-15 ENCOUNTER — Ambulatory Visit
Admission: EM | Admit: 2024-05-15 | Discharge: 2024-05-15 | Disposition: A | Discharge status: Home / Self Care | Attending: Family Medicine | Admitting: Family Medicine

## 2024-05-15 DIAGNOSIS — J029 Acute pharyngitis, unspecified: Secondary | ICD-10-CM | POA: Diagnosis not present

## 2024-05-15 DIAGNOSIS — B349 Viral infection, unspecified: Secondary | ICD-10-CM

## 2024-05-15 LAB — POC COVID19/FLU A&B COMBO
Covid Antigen, POC: NEGATIVE
Influenza A Antigen, POC: NEGATIVE
Influenza B Antigen, POC: NEGATIVE

## 2024-05-15 LAB — POCT RAPID STREP A (OFFICE): Rapid Strep A Screen: NEGATIVE

## 2024-05-15 NOTE — ED Triage Notes (Signed)
 Pt present with a sore throat, nasal congestion and cough X 3 days.   Pt states she has taken Mucinex at home. Last night she started having body aches.

## 2024-05-15 NOTE — ED Provider Notes (Signed)
 [facility]    CSN: [id number] Arrival date & time: 05/15/24  0801      History   Chief Complaint No chief complaint on file.   HPI Lacey Perez is a 23 y.o. female  presents for evaluation of URI symptoms for 3 days. Patient reports associated symptoms of cough, congestion, sore throat, body aches. Denies N/V/D, fevers, ear pain, shortness of breath. Patient does not have a hx of asthma. Patient does vape.  Reports sick contacts via clinicals.  Pt has taken Mucinex OTC for symptoms. Pt has no other concerns at this time.   HPI  Past Medical History:  Diagnosis Date   Asthma    PCOS (polycystic ovarian syndrome) 2025   UC (ulcerative colitis) (HCC)    Vitamin D deficiency     Patient Active Problem List   Diagnosis Date Noted   Routine screening for STI (sexually transmitted infection) 05/08/2023   PCOS (polycystic ovarian syndrome) 05/08/2023   Encounter for annual routine gynecological examination 05/08/2023   Anxiety 01/26/2023   Crohn's disease (HCC) 01/26/2023    Past Surgical History:  Procedure Laterality Date   HAND SURGERY  01/2023   TONSILLECTOMY AND ADENOIDECTOMY     WISDOM TOOTH EXTRACTION      OB History   No obstetric history on file.      Home Medications    Prior to Admission medications   Medication Sig Start Date End Date Taking? Authorizing Provider  acetaminophen  (TYLENOL ) 325 MG tablet Take 2 tablets (650 mg total) by mouth every 6 (six) hours as needed for moderate pain. 10/26/22   Christopher Savannah, PA-C  albuterol (VENTOLIN HFA) 108 (90 Base) MCG/ACT inhaler Inhale into the lungs. Patient not taking: Reported on 05/06/2024 12/19/22   [provider]  bictegravir-emtricitabine-tenofovir AF (BIKTARVY) 50-200-25 MG TABS tablet Take 1 tablet by mouth daily. 04/18/24   Waddell Alan PARAS, RPH-CPP  cetirizine  (ZYRTEC  ALLERGY) 10 MG tablet Take 1 tablet (10 mg total) by mouth daily. 10/26/22   Christopher Savannah, PA-C  fluticasone   (FLONASE ) 50 MCG/ACT nasal spray Place 2 sprays into both nostrils daily. 12/15/20   Mortenson, Ashley, MD  mesalamine  (CANASA ) 1000 MG suppository Place 1 suppository (1,000 mg total) rectally at bedtime. 05/06/24   [name], Aloha Raddle., MD  mesalamine  (LIALDA ) 1.2 g EC tablet Take 4 tablets (4.8 g total) by mouth daily with breakfast. 01/31/24   McMichael, Bayley M, PA-C  ondansetron (ZOFRAN) 4 MG tablet Take 1 tablet (4 mg total) by mouth every 8 (eight) hours as needed for nausea or vomiting. 05/06/24   [name], Aloha Raddle., MD  prenatal vitamin w/FE, FA (PRENATAL 1 + 1) 27-1 MG TABS tablet Take 1 tablet by mouth daily at 12 noon. 05/08/23   Tad Arland POUR, CNM  Probiotic Product (PROBIOTIC PO) Take 1 capsule by mouth daily.    [provider]  sertraline (ZOLOFT) 50 MG tablet Take 50 mg by mouth daily.    [provider]  traZODone (DESYREL) 25 mg TABS tablet Take 25 mg by mouth at bedtime.    [provider]  Vitamin D, Ergocalciferol, (DRISDOL) 1.25 MG (50000 UNIT) CAPS capsule Take 50,000 Units by mouth every 7 (seven) days.    [provider]  ipratropium (ATROVENT ) 0.03 % nasal spray Place 2 sprays into both nostrils 2 (two) times daily as needed for rhinitis. 05/28/20 06/13/20  Arloa Suzen RAMAN, NP  montelukast (SINGULAIR) 10 MG tablet Take 10 mg by mouth at bedtime.  10/11/19  [provider]    Family History Family History  Problem Relation Age of Onset   Diabetes Mother    Hypertension Mother    Asthma Mother    Fibroids Mother    Asthma Father    Asthma Sister    Colon polyps Maternal Grandmother    Breast cancer Paternal Grandmother    Diabetes Paternal Grandmother    Colon cancer Neg Hx    Esophageal cancer Neg Hx    Rectal cancer Neg Hx    Stomach cancer Neg Hx    Pancreatic cancer Neg Hx    Inflammatory bowel disease Neg Hx    Liver disease Neg Hx     Social History Social History   Tobacco Use   Smoking status:  Never   Smokeless tobacco: Never  Vaping Use   Vaping status: Every Day   Substances: Nicotine, Flavoring  Substance Use Topics   Alcohol use: Not Currently   Drug use: Never     Allergies   Patient has no known allergies.   Review of Systems Review of Systems  HENT:  Positive for congestion and sore throat.   Respiratory:  Positive for cough.   Musculoskeletal:  Positive for myalgias.     Physical Exam Triage Vital Signs ED Triage Vitals  Encounter Vitals Group     BP 05/15/24 0830 114/82     Girls Systolic BP Percentile --      Girls Diastolic BP Percentile --      Boys Systolic BP Percentile --      Boys Diastolic BP Percentile --      Pulse Rate 05/15/24 0830 76     Resp 05/15/24 0830 17     Temp 05/15/24 0830 98.2 F (36.8 C)     Temp src --      SpO2 05/15/24 0830 98 %     Weight --      Height --      Head Circumference --      Peak Flow --      Pain Score 05/15/24 0828 2     Pain Loc --      Pain Education --      Exclude from Growth Chart --    No data found.  Updated Vital Signs BP 114/82   Pulse 76   Temp 98.2 F (36.8 C)   Resp 17   LMP 04/21/2024 (Exact Date)   SpO2 98%   Visual Acuity Right Eye Distance:   Left Eye Distance:   Bilateral Distance:    Right Eye Near:   Left Eye Near:    Bilateral Near:     Physical Exam Vitals and nursing note reviewed.  Constitutional:      General: She is not in acute distress.    Appearance: She is well-developed. She is not ill-appearing.  HENT:     Head: Normocephalic and atraumatic.     Right Ear: Tympanic membrane and ear canal normal.     Left Ear: Tympanic membrane and ear canal normal.     Nose: Congestion present.     Mouth/Throat:     Mouth: Mucous membranes are moist.     Pharynx: Oropharynx is clear. Uvula midline. Posterior oropharyngeal erythema present.     Tonsils: No tonsillar exudate or tonsillar abscesses.  Eyes:     Conjunctiva/sclera: Conjunctivae normal.     Pupils:  Pupils are equal, round, and reactive to light.  Cardiovascular:     Rate  and Rhythm: Normal rate and regular rhythm.     Heart sounds: Normal heart sounds.  Pulmonary:     Effort: Pulmonary effort is normal.     Breath sounds: Normal breath sounds.  Musculoskeletal:     Cervical back: Normal range of motion and neck supple.  Lymphadenopathy:     Cervical: No cervical adenopathy.  Skin:    General: Skin is warm and dry.  Neurological:     General: No focal deficit present.     Mental Status: She is alert and oriented to person, place, and time.  Psychiatric:        Mood and Affect: Mood normal.        Behavior: Behavior normal.      UC Treatments / Results  Labs (all labs ordered are listed, but only abnormal results are displayed) Labs Reviewed  POCT RAPID STREP A (OFFICE)  POC COVID19/FLU A&B COMBO    EKG   Radiology No results found.  Procedures Procedures (including critical care time)  Medications Ordered in UC Medications - No data to display  Initial Impression / Assessment and Plan / UC Course  I have reviewed the triage vital signs and the nursing notes.  Pertinent labs & imaging results that were available during my care of the patient were reviewed by me and considered in my medical decision making (see chart for details).     Reviewed exam and symptoms with patient.  No red flags.  Negative COVID, flu, strep throat testing.  Discussed viral illness and symptomatic treatment.  PCP follow-up if symptoms do not improve.  ER precautions reviewed. Final Clinical Impressions(s) / UC Diagnoses   Final diagnoses:  Sore throat  Viral illness     Discharge Instructions      You have tested negative for COVID, flu, and strep throat.  Please treat your symptoms with over the counter cough medication, tylenol  or ibuprofen , humidifier, and rest. Viral illnesses can last 7-14 days. Please follow up with your PCP if your symptoms are not improving. Please go  to the ER for any worsening symptoms. This includes but is not limited to fever you can not control with tylenol  or ibuprofen , you are not able to stay hydrated, you have shortness of breath or chest pain.  Thank you for choosing [facility] for your healthcare needs. I hope you feel better soon!      ED Prescriptions   None    PDMP not reviewed this encounter.   Loreda Myla SAUNDERS, NP 05/15/24 [phone]

## 2024-05-15 NOTE — Discharge Instructions (Addendum)
 You have tested negative for COVID, flu, and strep throat. Please treat your symptoms with over the counter cough medication, tylenol or ibuprofen, humidifier, and rest. Viral illnesses can last 7-14 days. Please follow up with your PCP if your symptoms are not improving. Please go to the ER for any worsening symptoms. This includes but is not limited to fever you can not control with tylenol or ibuprofen, you are not able to stay hydrated, you have shortness of breath or chest pain.  Thank you for choosing [facility] for your healthcare needs. I hope you feel better soon!

## 2024-05-16 ENCOUNTER — Encounter: Payer: Self-pay | Admitting: Gastroenterology

## 2024-05-16 NOTE — Telephone Encounter (Signed)
 Yes, please continue. GM

## 2024-05-16 NOTE — Telephone Encounter (Signed)
 Dr Wilhelmenia do you want her to continue until her 3 month f/u?

## 2024-05-20 ENCOUNTER — Other Ambulatory Visit (HOSPITAL_COMMUNITY): Payer: Self-pay

## 2024-05-20 NOTE — Progress Notes (Unsigned)
 HPI: Lacey Perez is a 23 y.o. female who presents to the [facility] pharmacy clinic for HIV PEP follow-up.  Referring ID Physician: Dr. Fleeta Rothman   Patient Active Problem List   Diagnosis Date Noted   Routine screening for STI (sexually transmitted infection) 05/08/2023   PCOS (polycystic ovarian syndrome) 05/08/2023   Encounter for annual routine gynecological examination 05/08/2023   Anxiety 01/26/2023   Crohn's disease (HCC) 01/26/2023    Patient's Medications  New Prescriptions   No medications on file  Previous Medications   ACETAMINOPHEN  (TYLENOL ) 325 MG TABLET    Take 2 tablets (650 mg total) by mouth every 6 (six) hours as needed for moderate pain.   ALBUTEROL (VENTOLIN HFA) 108 (90 BASE) MCG/ACT INHALER    Inhale into the lungs.   BICTEGRAVIR-EMTRICITABINE-TENOFOVIR AF (BIKTARVY) 50-200-25 MG TABS TABLET    Take 1 tablet by mouth daily.   CETIRIZINE  (ZYRTEC  ALLERGY) 10 MG TABLET    Take 1 tablet (10 mg total) by mouth daily.   FLUTICASONE  (FLONASE ) 50 MCG/ACT NASAL SPRAY    Place 2 sprays into both nostrils daily.   MESALAMINE  (CANASA ) 1000 MG SUPPOSITORY    Place 1 suppository (1,000 mg total) rectally at bedtime.   MESALAMINE  (LIALDA ) 1.2 G EC TABLET    Take 4 tablets (4.8 g total) by mouth daily with breakfast.   ONDANSETRON (ZOFRAN) 4 MG TABLET    Take 1 tablet (4 mg total) by mouth every 8 (eight) hours as needed for nausea or vomiting.   PRENATAL VITAMIN W/FE, FA (PRENATAL 1 + 1) 27-1 MG TABS TABLET    Take 1 tablet by mouth daily at 12 noon.   PROBIOTIC PRODUCT (PROBIOTIC PO)    Take 1 capsule by mouth daily.   SERTRALINE (ZOLOFT) 50 MG TABLET    Take 50 mg by mouth daily.   TRAZODONE (DESYREL) 25 MG TABS TABLET    Take 25 mg by mouth at bedtime.   VITAMIN D, ERGOCALCIFEROL, (DRISDOL) 1.25 MG (50000 UNIT) CAPS CAPSULE    Take 50,000 Units by mouth every 7 (seven) days.  Modified Medications   No medications on file  Discontinued Medications   No medications on file     Allergies: No Known Allergies  Past Medical History: Past Medical History:  Diagnosis Date   Asthma    PCOS (polycystic ovarian syndrome) 2025   UC (ulcerative colitis) (HCC)    Vitamin D deficiency     Social History: Social History   Socioeconomic History   Marital status: Single    Spouse name: Not on file   Number of children: 0   Years of education: Not on file   Highest education level: Not on file  Occupational History   Not on file  Tobacco Use   Smoking status: Never   Smokeless tobacco: Never  Vaping Use   Vaping status: Every Day   Substances: Nicotine, Flavoring  Substance and Sexual Activity   Alcohol use: Not Currently   Drug use: Never   Sexual activity: Yes    Birth control/protection: None  Other Topics Concern   Not on file  Social History Narrative   Not on file   Social Drivers of Health   Financial Resource Strain: Not on file  Food Insecurity: No Food Insecurity (12/07/2021)   Hunger Vital Sign    Worried About Running Out of Food in the Last Year: Never true    Ran Out of Food in the Last Year: Never true  Transportation Needs: No Transportation Needs (08/07/2022)   PRAPARE - Administrator, Civil Service (Medical): No    Lack of Transportation (Non-Medical): No  Physical Activity: Not on file  Stress: Stress Concern Present (09/06/2022)   [employer] of Occupational Health - Occupational Stress Questionnaire    Feeling of Stress : Rather much  Social Connections: Not on file    Labs: Lab Results  Component Value Date   HIV1RNAQUANT Not Detected 05/11/2023    RPR and STI Lab Results  Component Value Date   LABRPR Non Reactive 05/08/2023   LABRPR Non Reactive 01/12/2023   LABRPR Non Reactive 03/24/2022    STI Results GC CT  05/08/2023 11:35 AM Negative  Negative   01/12/2023  9:11 AM Negative  Negative   07/05/2022  3:19 PM Negative  Negative   03/24/2022  9:56 AM Negative  Negative     Hepatitis  B Lab Results  Component Value Date   HEPBSAB REACTIVE (A) 04/18/2024   HEPBSAG NON-REACTIVE 04/18/2024   HEPBCAB NON-REACTIVE 04/18/2024   Hepatitis C Lab Results  Component Value Date   HEPCAB NON-REACTIVE 04/18/2024   Hepatitis A Lab Results  Component Value Date   HAV REACTIVE (A) 04/18/2024   Lipids: No results found for: CHOL, TRIG, HDL, CHOLHDL, VLDL, LDLCALC   Assessment: Lacey Perez presents to clinic today for HIV PEP follow-up. States she completed her 28-day course of Biktarvy without any missed doses or side effects. Will repeat HIV antibody today. States they did not receive labs from needlestick patient. Can follow up with PCP or with us  in 3 months to reassess HIV antibody one final time.   Plan: - Check HIV antibody - Follow up with ***   Alan Geralds, PharmD, CPP, BCIDP, AAHIVP Clinical Pharmacist Practitioner Infectious Diseases Clinical Pharmacist [facility] 05/20/2024, 10:08 AM

## 2024-05-22 ENCOUNTER — Other Ambulatory Visit: Payer: Self-pay

## 2024-05-22 ENCOUNTER — Ambulatory Visit: Admitting: Pharmacist

## 2024-05-22 DIAGNOSIS — Z206 Contact with and (suspected) exposure to human immunodeficiency virus [HIV]: Secondary | ICD-10-CM | POA: Diagnosis not present

## 2024-05-23 LAB — HIV ANTIBODY (ROUTINE TESTING W REFLEX)
HIV 1&2 Ab, 4th Generation: NONREACTIVE
HIV FINAL INTERPRETATION: NEGATIVE

## 2024-06-27 ENCOUNTER — Telehealth: Payer: Self-pay | Admitting: Gastroenterology

## 2024-06-27 NOTE — Telephone Encounter (Signed)
 The pt would like documentation on her diagnosis- I have advised that she can access the office note with all relevant information. She will call back if this is not sufficient.

## 2024-06-27 NOTE — Telephone Encounter (Signed)
 PT is calling to have a note put onto her MyChart documenting what she is going through for her job. Please advise.

## 2024-07-21 ENCOUNTER — Encounter (HOSPITAL_BASED_OUTPATIENT_CLINIC_OR_DEPARTMENT_OTHER): Payer: Self-pay | Admitting: Certified Nurse Midwife

## 2024-07-31 ENCOUNTER — Ambulatory Visit
Admission: EM | Admit: 2024-07-31 | Discharge: 2024-07-31 | Disposition: A | Discharge status: Home / Self Care | Attending: Family Medicine | Admitting: Family Medicine

## 2024-07-31 DIAGNOSIS — N912 Amenorrhea, unspecified: Secondary | ICD-10-CM | POA: Diagnosis not present

## 2024-07-31 DIAGNOSIS — Z3201 Encounter for pregnancy test, result positive: Secondary | ICD-10-CM

## 2024-07-31 LAB — POCT URINE PREGNANCY: Preg Test, Ur: POSITIVE — AB

## 2024-07-31 NOTE — ED Triage Notes (Addendum)
 Pt presents for evaluation of amenorrhea for the last 2 months. Pt has taken several at home pregnancy test that were negative. Patient is requesting lab drawn. Patient denies any nausea, vomiting, dizziness. Pt reports that mid November she had one day of pink discharge, nothing since. Patient is not taking any contraceptives. 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

## 2024-07-31 NOTE — ED Provider Notes (Signed)
 " [facility]  Note:  This document was prepared using Conservation officer, historic buildings and may include unintentional dictation errors.  MRN: [id number] DOB: 25-Nov-2000  Subjective:   Lacey Perez is a 24 y.o. female presenting for 26-month history of amenorrhea.  Patient has been taking multiple test at home and was negative.  Denies fever, n/v, abdominal pain, pelvic pain, rashes, dysuria, urinary frequency, hematuria, vaginal discharge.  .    Current Outpatient Medications  Medication Instructions   acetaminophen  (TYLENOL ) 650 mg, Oral, Every 6 hours PRN   albuterol (VENTOLIN HFA) 108 (90 Base) MCG/ACT inhaler Inhale into the lungs.   buPROPion (WELLBUTRIN XL) 150 mg, Daily   cetirizine  (ZYRTEC  ALLERGY) 10 mg, Oral, Daily   fluticasone  (FLONASE ) 50 MCG/ACT nasal spray 2 sprays, Each Nare, Daily   mesalamine  (CANASA ) 1,000 mg, Rectal, Daily at bedtime   mesalamine  (LIALDA ) 4.8 g, Oral, Daily with breakfast   ondansetron  (ZOFRAN ) 4 mg, Oral, Every 8 hours PRN   prenatal vitamin w/FE, FA (PRENATAL 1 + 1) 27-1 MG TABS tablet 1 tablet, Oral, Daily   Probiotic Product (PROBIOTIC PO) 1 capsule, Daily   sertraline (ZOLOFT) 50 mg, Daily   traZODone (DESYREL) 25 mg, Daily at bedtime   Vitamin D (Ergocalciferol) (DRISDOL) 50,000 Units, Every 7 days    Allergies[1]  Past Medical History:  Diagnosis Date   Asthma    PCOS (polycystic ovarian syndrome) 2025   UC (ulcerative colitis) (HCC)    Vitamin D deficiency      Past Surgical History:  Procedure Laterality Date   HAND SURGERY  01/2023   TONSILLECTOMY AND ADENOIDECTOMY     WISDOM TOOTH EXTRACTION      Family History  Problem Relation Age of Onset   Diabetes Mother    Hypertension Mother    Asthma Mother    Fibroids Mother    Asthma Father    Asthma Sister    Colon polyps Maternal Grandmother    Breast cancer Paternal Grandmother    Diabetes Paternal Grandmother    Colon cancer Neg Hx     Esophageal cancer Neg Hx    Rectal cancer Neg Hx    Stomach cancer Neg Hx    Pancreatic cancer Neg Hx    Inflammatory bowel disease Neg Hx    Liver disease Neg Hx     Social History   Occupational History   Not on file  Tobacco Use   Smoking status: Never   Smokeless tobacco: Never  Vaping Use   Vaping status: Every Day   Substances: Nicotine, Flavoring  Substance and Sexual Activity   Alcohol use: Not Currently   Drug use: Never   Sexual activity: Yes    Birth control/protection: None     ROS   Objective:   Vitals: BP (!) 131/91 (BP Location: Right Arm)   Pulse 76   Temp 98 F (36.7 C) (Oral)   Resp 16   LMP 05/24/2024 (Exact Date)   SpO2 98%   Physical Exam Constitutional:      General: She is not in acute distress.    Appearance: Normal appearance. She is well-developed. She is not ill-appearing, toxic-appearing or diaphoretic.  HENT:     Head: Normocephalic and atraumatic.     Nose: Nose normal.     Mouth/Throat:     Mouth: Mucous membranes are moist.  Eyes:     General: No scleral icterus.       Right eye: No discharge.  Left eye: No discharge.     Extraocular Movements: Extraocular movements intact.     Conjunctiva/sclera: Conjunctivae normal.  Cardiovascular:     Rate and Rhythm: Normal rate.  Pulmonary:     Effort: Pulmonary effort is normal.  Abdominal:     General: Bowel sounds are normal. There is no distension.     Palpations: Abdomen is soft. There is no mass.     Tenderness: There is no abdominal tenderness. There is no right CVA tenderness, left CVA tenderness, guarding or rebound.  Skin:    General: Skin is warm and dry.  Neurological:     General: No focal deficit present.     Mental Status: She is alert and oriented to person, place, and time.  Psychiatric:        Mood and Affect: Mood normal.        Behavior: Behavior normal.        Thought Content: Thought content normal.        Judgment: Judgment normal.     Results  for orders placed or performed during the hospital encounter of 07/31/24 (from the past 24 hours)  POCT urine pregnancy     Status: Abnormal   Collection Time: 07/31/24 11:04 AM  Result Value Ref Range   Preg Test, Ur Positive (A) Negative    Assessment and Plan :   PDMP not reviewed this encounter.  1. Amenorrhea      Anticipatory guidance provided to patient regarding prenatal care.  She is to start a prenatal vitamin and counseled on avoiding alcohol, cigarettes, drug use, caffeine.  Also counseled on medications that are safe in pregnancy over-the-counter. Counseled patient on potential for adverse effects with medications prescribed/recommended today, ER and return-to-clinic precautions discussed, patient verbalized understanding.     [1] No Known Allergies    Christopher Savannah, PA-C 07/31/24 1547  "

## 2024-07-31 NOTE — Discharge Instructions (Addendum)
 Your pregnancy test was positive and therefore I recommend seeking a consultation with an obstetrician.  Please start a prenatal vitamin.  Avoid any alcohol use.  Do not use any nonsteroidal anti-inflammatories (NSAIDs) like ibuprofen , Motrin , naproxen , Aleve , etc. which are all available over-the-counter.  Please just use Tylenol  at a dose of 500mg -650mg  once every 6 hours as needed for your aches, pains, fevers. Continue taking mesalamine , Wellbutrin, Zyrtec , Flonase .

## 2024-08-04 ENCOUNTER — Encounter (HOSPITAL_BASED_OUTPATIENT_CLINIC_OR_DEPARTMENT_OTHER): Payer: Self-pay

## 2024-08-04 ENCOUNTER — Ambulatory Visit (HOSPITAL_BASED_OUTPATIENT_CLINIC_OR_DEPARTMENT_OTHER): Payer: Self-pay

## 2024-08-04 VITALS — BP 128/82 | HR 80 | Ht 66.0 in | Wt 182.2 lb

## 2024-08-04 DIAGNOSIS — Z3202 Encounter for pregnancy test, result negative: Secondary | ICD-10-CM

## 2024-08-04 DIAGNOSIS — N912 Amenorrhea, unspecified: Secondary | ICD-10-CM

## 2024-08-04 LAB — POCT URINE PREGNANCY: Preg Test, Ur: NEGATIVE

## 2024-08-04 LAB — BETA HCG QUANT (REF LAB): hCG Quant: <1 m[IU]/mL

## 2024-08-04 NOTE — Progress Notes (Signed)
 NURSE VISIT- PREGNANCY CONFIRMATION   SUBJECTIVE:  Lacey Perez is a 24 y.o. G0P0 female. Patient's last menstrual period was 05/24/2024 (exact date). Here for pregnancy confirmation.  Home pregnancy test: positive x 0. Reports positive UPT at urgent care on 07/31/2024 (results in chart).  She reports no complaints.  She is not taking prenatal vitamins.   We discussed EDD of 02/28/2025 based on LMP of 05/24/2024. Pt is G0P0. I reviewed her allergies, medications and Medical/Surgical/OB history.    OBJECTIVE:  BP 128/82 (BP Location: Right Arm, Patient Position: Sitting, Cuff Size: Large)   Pulse 80   Ht 5' 6 (1.676 m)   Wt 182 lb 3.2 oz (82.6 kg)   LMP 05/24/2024 (Exact Date)   SpO2 100%   Breastfeeding Unknown   BMI 29.41 kg/m   Appears well, in no apparent distress  Results for orders placed or performed in visit on 08/04/24 (from the past 24 hours)  POCT urine pregnancy   Collection Time: 08/04/24  3:29 PM  Result Value Ref Range   Preg Test, Ur Negative Negative    Last Pap Diagnosis  Date Value Ref Range Status  03/24/2022   Final   - Negative for intraepithelial lesion or malignancy (NILM)    ASSESSMENT: Negative UPT in office.    Patient Active Problem List   Diagnosis Date Noted   Routine screening for STI (sexually transmitted infection) 05/08/2023   PCOS (polycystic ovarian syndrome) 05/08/2023   Encounter for annual routine gynecological examination 05/08/2023   Anxiety 01/26/2023   Crohn's disease (HCC) 01/26/2023     PLAN: Prenatal vitamins: Not currently needed. Nausea medicines: not currently needed. Beta hcg quant ordered. Follow up based on results.  Signa Cheek E, RN 08/04/2024  3:30 PM

## 2024-08-05 ENCOUNTER — Ambulatory Visit (HOSPITAL_BASED_OUTPATIENT_CLINIC_OR_DEPARTMENT_OTHER): Payer: Self-pay | Admitting: Obstetrics & Gynecology

## 2024-08-05 DIAGNOSIS — N912 Amenorrhea, unspecified: Secondary | ICD-10-CM

## 2024-08-06 ENCOUNTER — Ambulatory Visit (HOSPITAL_BASED_OUTPATIENT_CLINIC_OR_DEPARTMENT_OTHER): Payer: Self-pay | Admitting: Obstetrics & Gynecology

## 2024-08-11 ENCOUNTER — Encounter (HOSPITAL_BASED_OUTPATIENT_CLINIC_OR_DEPARTMENT_OTHER): Payer: Self-pay | Admitting: Obstetrics & Gynecology

## 2024-08-11 ENCOUNTER — Ambulatory Visit (INDEPENDENT_AMBULATORY_CARE_PROVIDER_SITE_OTHER): Payer: Self-pay | Admitting: Obstetrics & Gynecology

## 2024-08-11 ENCOUNTER — Ambulatory Visit (HOSPITAL_BASED_OUTPATIENT_CLINIC_OR_DEPARTMENT_OTHER)

## 2024-08-11 VITALS — BP 137/86 | HR 89 | Wt 183.6 lb

## 2024-08-11 DIAGNOSIS — N912 Amenorrhea, unspecified: Secondary | ICD-10-CM | POA: Diagnosis not present

## 2024-08-11 DIAGNOSIS — Z319 Encounter for procreative management, unspecified: Secondary | ICD-10-CM | POA: Diagnosis not present

## 2024-08-11 DIAGNOSIS — E282 Polycystic ovarian syndrome: Secondary | ICD-10-CM | POA: Diagnosis not present

## 2024-08-11 MED ORDER — MEDROXYPROGESTERONE ACETATE 10 MG PO TABS: 10.0000 mg | ORAL_TABLET | Freq: Every day | ORAL | 0 refills | Status: AC

## 2024-08-11 MED ORDER — PRENATAL PLUS 27-1 MG PO TABS: 1.0000 | ORAL_TABLET | Freq: Every day | ORAL | 3 refills | Status: AC

## 2024-08-11 NOTE — Progress Notes (Signed)
 "  GYNECOLOGY  VISIT  CC: Amenorrhea   HPI: 24 y.o. G1P0 Single Black or African American female here for ultrasound follow-up after missing cycle the last 2 months and having a positive urine pregnancy test on January 8 at urgent care.  She was seen on January 12 here for confirmation of pregnancy and had a negative pregnancy test as well as a negative hCG.  Ultrasound for viability was scheduled and patient wanted to keep this appointment to evaluate the uterus and ovaries for cause of amenorrhea.  Ultrasound today showed normal uterus with thickened endometrium at 11 mm.  Ovaries have PCOS appearance.  She has been told in the past she has polycystic ovarian syndrome but she has always had regular cycles until most recently.  She does note some recent weight gain as well.  The diagnosis of polycystic ovarian syndrome versus polycystic ovarian appearance discussed.  She has not had any recent blood work.  She is interested in being pregnant.  Discussed treatment options for both PCOS and ovulation induction.  She is interested in both.  She is gena return for blood work later this week and do it fasting to include a fasting insulin  level.  This will help in determining the dosing of metformin.  We also discussed the fertility evaluation including semen analysis, testing for ovulation especially if using ovulation induction agents, tubal patency testing.  Semen analysis kit provided instructions provided.  She does need to let me know if she wants to proceed so I can fax an order.  Patient's last menstrual period was 05/24/2024 (exact date).  Past Medical History:  Diagnosis Date   Asthma    PCOS (polycystic ovarian syndrome) 2025   UC (ulcerative colitis) (HCC)    Vitamin D deficiency     MEDS:  Reviewed in EPIC  ALLERGIES: Patient has no known allergies.  SH: Single, non-smoker  Review of Systems  Constitutional: Negative.   Genitourinary:        Amenorrhea    PHYSICAL  EXAMINATION:    BP 137/86 (BP Location: Right Arm, Patient Position: Sitting, Cuff Size: Normal)   Pulse 89   Wt 183 lb 9.6 oz (83.3 kg)   LMP 05/24/2024 (Exact Date)   SpO2 100%   Breastfeeding Unknown   BMI 29.63 kg/m     Physical Exam Constitutional:      Appearance: Normal appearance.  Neurological:     General: No focal deficit present.     Mental Status: She is alert and oriented to person, place, and time.  Psychiatric:        Mood and Affect: Mood normal.        Behavior: Behavior normal.      Assessment/Plan: 1. PCOS (polycystic ovarian syndrome) (Primary) - she will return for blood work - discussed treatment with metformin pending insulin  results - no evidence of hisutism so testosterone  level not obtained - ovulation induction agents reviewed as well - Lipid panel; Future - Hemoglobin A1c; Future - Insulin , random; Future  2. Amenorrhea - will start provera  10mg  x 10 days to start cycle.  Will obtained blood work as well.  Can proceed with ovulation induction agents this next month if desired pt. - TSH; Future - Prolactin; Future - Follicle stimulating hormone; Future  3. Patient desires pregnancy - semen analysis testing and kit provided - will plan progesterone levels if uses clomid or femara - Sonohysterogram for tubal patency discussed as well  Total time with pt:  33 minutes Additional  documentation time:  5 minutes Total time 38 minutes    "

## 2024-08-12 ENCOUNTER — Ambulatory Visit (HOSPITAL_BASED_OUTPATIENT_CLINIC_OR_DEPARTMENT_OTHER): Admitting: Obstetrics & Gynecology

## 2024-08-13 LAB — LIPID PANEL
Chol/HDL Ratio: 2.5 ratio (ref 0.0–4.4)
Cholesterol, Total: 184 mg/dL (ref 100–199)
HDL: 75 mg/dL
LDL Chol Calc (NIH): 97 mg/dL (ref 0–99)
Triglycerides: 66 mg/dL (ref 0–149)
VLDL Cholesterol Cal: 12 mg/dL (ref 5–40)

## 2024-08-13 LAB — PROLACTIN: Prolactin: 33.4 ng/mL (ref 4.8–33.4)

## 2024-08-13 LAB — FOLLICLE STIMULATING HORMONE: FSH: 5.5 m[IU]/mL

## 2024-08-13 LAB — INSULIN, RANDOM: INSULIN: 5.3 u[IU]/mL (ref 2.6–24.9)

## 2024-08-13 LAB — TSH: TSH: 1.15 u[IU]/mL (ref 0.450–4.500)

## 2024-08-13 LAB — HEMOGLOBIN A1C
Est. average glucose Bld gHb Est-mCnc: 103 mg/dL
Hgb A1c MFr Bld: 5.2 % (ref 4.8–5.6)

## 2024-08-17 ENCOUNTER — Encounter (HOSPITAL_BASED_OUTPATIENT_CLINIC_OR_DEPARTMENT_OTHER): Payer: Self-pay | Admitting: Obstetrics & Gynecology

## 2024-08-20 ENCOUNTER — Ambulatory Visit (HOSPITAL_BASED_OUTPATIENT_CLINIC_OR_DEPARTMENT_OTHER): Payer: Self-pay | Admitting: Obstetrics & Gynecology

## 2024-08-20 DIAGNOSIS — E282 Polycystic ovarian syndrome: Secondary | ICD-10-CM

## 2024-08-20 MED ORDER — METFORMIN HCL 500 MG PO TABS: 500.0000 mg | ORAL_TABLET | Freq: Every day | ORAL | 5 refills | Status: AC
